# Patient Record
Sex: Male | Born: 1965 | Race: Black or African American | Hispanic: No | Marital: Married | Smoking: Never smoker
Health system: Southern US, Community
[De-identification: ages and names within clinical notes are randomized; demographics above are authoritative.]

## PROBLEM LIST (undated history)

## (undated) DIAGNOSIS — E119 Type 2 diabetes mellitus without complications: Secondary | ICD-10-CM

## (undated) DIAGNOSIS — G4733 Obstructive sleep apnea (adult) (pediatric): Secondary | ICD-10-CM

## (undated) DIAGNOSIS — Z9114 Patient's other noncompliance with medication regimen: Secondary | ICD-10-CM

## (undated) DIAGNOSIS — M545 Low back pain, unspecified: Secondary | ICD-10-CM

## (undated) DIAGNOSIS — Z91148 Patient's other noncompliance with medication regimen for other reason: Secondary | ICD-10-CM

## (undated) DIAGNOSIS — E669 Obesity, unspecified: Secondary | ICD-10-CM

## (undated) DIAGNOSIS — I10 Essential (primary) hypertension: Secondary | ICD-10-CM

## (undated) DIAGNOSIS — K219 Gastro-esophageal reflux disease without esophagitis: Secondary | ICD-10-CM

## (undated) DIAGNOSIS — N400 Enlarged prostate without lower urinary tract symptoms: Secondary | ICD-10-CM

## (undated) HISTORY — DX: Gastro-esophageal reflux disease without esophagitis: K21.9

## (undated) HISTORY — PX: SPINE SURGERY: SHX786

## (undated) HISTORY — DX: Low back pain: M54.5

## (undated) HISTORY — DX: Low back pain, unspecified: M54.50

## (undated) HISTORY — PX: SHOULDER ARTHROSCOPY: SHX128

## (undated) HISTORY — DX: Benign prostatic hyperplasia without lower urinary tract symptoms: N40.0

---

## 2002-07-06 ENCOUNTER — Emergency Department (HOSPITAL_COMMUNITY): Admission: EM | Admit: 2002-07-06 | Discharge: 2002-07-06 | Payer: Self-pay | Admitting: Emergency Medicine

## 2003-08-11 ENCOUNTER — Emergency Department (HOSPITAL_COMMUNITY): Admission: EM | Admit: 2003-08-11 | Discharge: 2003-08-11 | Payer: Self-pay | Admitting: *Deleted

## 2003-09-04 ENCOUNTER — Emergency Department (HOSPITAL_COMMUNITY): Admission: EM | Admit: 2003-09-04 | Discharge: 2003-09-04 | Payer: Self-pay | Admitting: Emergency Medicine

## 2003-12-01 ENCOUNTER — Emergency Department (HOSPITAL_COMMUNITY): Admission: EM | Admit: 2003-12-01 | Discharge: 2003-12-01 | Payer: Self-pay | Admitting: Emergency Medicine

## 2003-12-03 ENCOUNTER — Emergency Department (HOSPITAL_COMMUNITY): Admission: EM | Admit: 2003-12-03 | Discharge: 2003-12-03 | Payer: Self-pay | Admitting: Emergency Medicine

## 2003-12-20 ENCOUNTER — Emergency Department (HOSPITAL_COMMUNITY): Admission: EM | Admit: 2003-12-20 | Discharge: 2003-12-20 | Payer: Self-pay | Admitting: Emergency Medicine

## 2004-04-28 ENCOUNTER — Emergency Department (HOSPITAL_COMMUNITY): Admission: EM | Admit: 2004-04-28 | Discharge: 2004-04-28 | Payer: Self-pay | Admitting: Emergency Medicine

## 2004-06-18 ENCOUNTER — Emergency Department (HOSPITAL_COMMUNITY): Admission: EM | Admit: 2004-06-18 | Discharge: 2004-06-18 | Payer: Self-pay | Admitting: *Deleted

## 2004-07-16 ENCOUNTER — Emergency Department (HOSPITAL_COMMUNITY): Admission: EM | Admit: 2004-07-16 | Discharge: 2004-07-16 | Payer: Self-pay | Admitting: *Deleted

## 2004-09-17 ENCOUNTER — Emergency Department (HOSPITAL_COMMUNITY): Admission: EM | Admit: 2004-09-17 | Discharge: 2004-09-17 | Payer: Self-pay | Admitting: Emergency Medicine

## 2004-10-03 ENCOUNTER — Ambulatory Visit: Payer: Self-pay | Admitting: Orthopedic Surgery

## 2004-10-04 ENCOUNTER — Encounter: Payer: Self-pay | Admitting: Orthopedic Surgery

## 2004-10-16 ENCOUNTER — Ambulatory Visit: Payer: Self-pay | Admitting: Orthopedic Surgery

## 2004-10-28 ENCOUNTER — Encounter: Admission: RE | Admit: 2004-10-28 | Discharge: 2004-10-28 | Payer: Self-pay | Admitting: Orthopedic Surgery

## 2004-11-06 ENCOUNTER — Ambulatory Visit: Payer: Self-pay | Admitting: Orthopedic Surgery

## 2004-11-11 ENCOUNTER — Encounter: Admission: RE | Admit: 2004-11-11 | Discharge: 2004-11-11 | Payer: Self-pay | Admitting: Orthopedic Surgery

## 2004-11-20 ENCOUNTER — Ambulatory Visit: Payer: Self-pay | Admitting: Orthopedic Surgery

## 2004-11-26 ENCOUNTER — Encounter: Admission: RE | Admit: 2004-11-26 | Discharge: 2004-11-26 | Payer: Self-pay | Admitting: Orthopedic Surgery

## 2005-03-02 ENCOUNTER — Emergency Department (HOSPITAL_COMMUNITY): Admission: EM | Admit: 2005-03-02 | Discharge: 2005-03-02 | Payer: Self-pay | Admitting: Emergency Medicine

## 2005-04-07 ENCOUNTER — Ambulatory Visit: Payer: Self-pay | Admitting: Family Medicine

## 2005-04-09 ENCOUNTER — Ambulatory Visit (HOSPITAL_COMMUNITY): Admission: RE | Admit: 2005-04-09 | Discharge: 2005-04-09 | Payer: Self-pay | Admitting: Family Medicine

## 2005-04-11 ENCOUNTER — Ambulatory Visit (HOSPITAL_COMMUNITY): Admission: RE | Admit: 2005-04-11 | Discharge: 2005-04-11 | Payer: Self-pay | Admitting: Family Medicine

## 2005-04-15 ENCOUNTER — Emergency Department (HOSPITAL_COMMUNITY): Admission: EM | Admit: 2005-04-15 | Discharge: 2005-04-15 | Payer: Self-pay | Admitting: Emergency Medicine

## 2005-05-05 ENCOUNTER — Ambulatory Visit: Payer: Self-pay | Admitting: Family Medicine

## 2005-05-05 ENCOUNTER — Emergency Department (HOSPITAL_COMMUNITY): Admission: EM | Admit: 2005-05-05 | Discharge: 2005-05-06 | Payer: Self-pay | Admitting: Emergency Medicine

## 2005-05-07 ENCOUNTER — Ambulatory Visit (HOSPITAL_COMMUNITY): Admission: RE | Admit: 2005-05-07 | Discharge: 2005-05-07 | Payer: Self-pay | Admitting: Neurology

## 2005-05-08 ENCOUNTER — Ambulatory Visit: Payer: Self-pay | Admitting: Family Medicine

## 2005-05-12 ENCOUNTER — Encounter (INDEPENDENT_AMBULATORY_CARE_PROVIDER_SITE_OTHER): Payer: Self-pay | Admitting: Specialist

## 2005-05-12 ENCOUNTER — Inpatient Hospital Stay (HOSPITAL_COMMUNITY): Admission: RE | Admit: 2005-05-12 | Discharge: 2005-05-15 | Payer: Self-pay | Admitting: Neurological Surgery

## 2005-06-02 ENCOUNTER — Ambulatory Visit: Payer: Self-pay | Admitting: Family Medicine

## 2005-07-23 ENCOUNTER — Emergency Department (HOSPITAL_COMMUNITY): Admission: EM | Admit: 2005-07-23 | Discharge: 2005-07-24 | Payer: Self-pay | Admitting: Emergency Medicine

## 2005-09-02 ENCOUNTER — Ambulatory Visit (HOSPITAL_COMMUNITY): Admission: RE | Admit: 2005-09-02 | Discharge: 2005-09-02 | Payer: Self-pay | Admitting: Neurological Surgery

## 2006-01-21 ENCOUNTER — Ambulatory Visit: Payer: Self-pay | Admitting: Orthopedic Surgery

## 2006-02-12 ENCOUNTER — Ambulatory Visit: Payer: Self-pay | Admitting: Orthopedic Surgery

## 2006-06-15 ENCOUNTER — Ambulatory Visit: Payer: Self-pay | Admitting: Orthopedic Surgery

## 2006-11-01 ENCOUNTER — Emergency Department (HOSPITAL_COMMUNITY): Admission: EM | Admit: 2006-11-01 | Discharge: 2006-11-02 | Payer: Self-pay | Admitting: Emergency Medicine

## 2006-12-03 ENCOUNTER — Ambulatory Visit: Payer: Self-pay | Admitting: Orthopedic Surgery

## 2006-12-04 ENCOUNTER — Ambulatory Visit: Payer: Self-pay | Admitting: Family Medicine

## 2006-12-07 ENCOUNTER — Encounter: Payer: Self-pay | Admitting: Orthopedic Surgery

## 2006-12-08 ENCOUNTER — Encounter: Payer: Self-pay | Admitting: Family Medicine

## 2006-12-08 DIAGNOSIS — M545 Low back pain, unspecified: Secondary | ICD-10-CM | POA: Insufficient documentation

## 2006-12-08 DIAGNOSIS — N400 Enlarged prostate without lower urinary tract symptoms: Secondary | ICD-10-CM | POA: Insufficient documentation

## 2006-12-08 DIAGNOSIS — E669 Obesity, unspecified: Secondary | ICD-10-CM

## 2006-12-08 DIAGNOSIS — G43909 Migraine, unspecified, not intractable, without status migrainosus: Secondary | ICD-10-CM | POA: Insufficient documentation

## 2006-12-08 DIAGNOSIS — K219 Gastro-esophageal reflux disease without esophagitis: Secondary | ICD-10-CM

## 2006-12-08 DIAGNOSIS — E785 Hyperlipidemia, unspecified: Secondary | ICD-10-CM

## 2006-12-08 DIAGNOSIS — H52209 Unspecified astigmatism, unspecified eye: Secondary | ICD-10-CM | POA: Insufficient documentation

## 2006-12-08 DIAGNOSIS — I10 Essential (primary) hypertension: Secondary | ICD-10-CM

## 2006-12-08 LAB — CONVERTED CEMR LAB
ALT: 26 units/L (ref 0–53)
Calcium: 9.1 mg/dL (ref 8.4–10.5)
Cholesterol: 172 mg/dL (ref 0–200)
Creatinine, Ser: 1.17 mg/dL (ref 0.40–1.50)
Glucose, Bld: 107 mg/dL — ABNORMAL HIGH (ref 70–99)
Hemoglobin: 16.1 g/dL (ref 13.0–17.0)
Lymphocytes Relative: 40 % (ref 12–46)
MCHC: 32.7 g/dL (ref 30.0–36.0)
MCV: 95.4 fL (ref 78.0–100.0)
Monocytes Absolute: 0.5 10*3/uL (ref 0.2–0.7)
PSA: 0.76 ng/mL (ref 0.10–4.00)
Sodium: 145 meq/L (ref 135–145)
Total Bilirubin: 0.5 mg/dL (ref 0.3–1.2)
Total CHOL/HDL Ratio: 3.7
VLDL: 11 mg/dL (ref 0–40)

## 2006-12-14 ENCOUNTER — Encounter (INDEPENDENT_AMBULATORY_CARE_PROVIDER_SITE_OTHER): Payer: Self-pay | Admitting: Family Medicine

## 2006-12-16 ENCOUNTER — Emergency Department (HOSPITAL_COMMUNITY): Admission: EM | Admit: 2006-12-16 | Discharge: 2006-12-16 | Payer: Self-pay | Admitting: Emergency Medicine

## 2006-12-18 ENCOUNTER — Ambulatory Visit: Payer: Self-pay | Admitting: Family Medicine

## 2006-12-21 ENCOUNTER — Ambulatory Visit: Payer: Self-pay | Admitting: Orthopedic Surgery

## 2006-12-24 ENCOUNTER — Encounter (HOSPITAL_COMMUNITY): Admission: RE | Admit: 2006-12-24 | Discharge: 2007-01-23 | Payer: Self-pay | Admitting: Orthopedic Surgery

## 2006-12-26 ENCOUNTER — Ambulatory Visit (HOSPITAL_COMMUNITY): Admission: RE | Admit: 2006-12-26 | Discharge: 2006-12-26 | Payer: Self-pay | Admitting: Family Medicine

## 2006-12-26 ENCOUNTER — Encounter (INDEPENDENT_AMBULATORY_CARE_PROVIDER_SITE_OTHER): Payer: Self-pay | Admitting: Family Medicine

## 2006-12-26 ENCOUNTER — Ambulatory Visit: Admission: RE | Admit: 2006-12-26 | Discharge: 2006-12-26 | Payer: Self-pay | Admitting: Family Medicine

## 2006-12-28 ENCOUNTER — Encounter (INDEPENDENT_AMBULATORY_CARE_PROVIDER_SITE_OTHER): Payer: Self-pay | Admitting: Family Medicine

## 2007-01-01 ENCOUNTER — Encounter (INDEPENDENT_AMBULATORY_CARE_PROVIDER_SITE_OTHER): Payer: Self-pay | Admitting: Family Medicine

## 2007-01-12 ENCOUNTER — Ambulatory Visit: Payer: Self-pay | Admitting: Family Medicine

## 2007-01-12 DIAGNOSIS — R0789 Other chest pain: Secondary | ICD-10-CM

## 2007-01-12 DIAGNOSIS — R1012 Left upper quadrant pain: Secondary | ICD-10-CM

## 2007-01-12 DIAGNOSIS — R7989 Other specified abnormal findings of blood chemistry: Secondary | ICD-10-CM | POA: Insufficient documentation

## 2007-01-12 DIAGNOSIS — R519 Headache, unspecified: Secondary | ICD-10-CM | POA: Insufficient documentation

## 2007-01-12 DIAGNOSIS — R51 Headache: Secondary | ICD-10-CM

## 2007-01-12 DIAGNOSIS — G4730 Sleep apnea, unspecified: Secondary | ICD-10-CM | POA: Insufficient documentation

## 2007-01-12 LAB — CONVERTED CEMR LAB: Cholesterol, target level: 200 mg/dL

## 2007-01-13 ENCOUNTER — Ambulatory Visit: Payer: Self-pay | Admitting: Pulmonary Disease

## 2007-01-15 ENCOUNTER — Telehealth (INDEPENDENT_AMBULATORY_CARE_PROVIDER_SITE_OTHER): Payer: Self-pay | Admitting: Family Medicine

## 2007-01-15 ENCOUNTER — Encounter (INDEPENDENT_AMBULATORY_CARE_PROVIDER_SITE_OTHER): Payer: Self-pay | Admitting: Family Medicine

## 2007-01-26 ENCOUNTER — Encounter (INDEPENDENT_AMBULATORY_CARE_PROVIDER_SITE_OTHER): Payer: Self-pay | Admitting: Family Medicine

## 2007-01-27 ENCOUNTER — Encounter (HOSPITAL_COMMUNITY): Admission: RE | Admit: 2007-01-27 | Discharge: 2007-02-26 | Payer: Self-pay | Admitting: Orthopedic Surgery

## 2007-01-29 ENCOUNTER — Ambulatory Visit (HOSPITAL_COMMUNITY): Admission: RE | Admit: 2007-01-29 | Discharge: 2007-01-29 | Payer: Self-pay | Admitting: *Deleted

## 2007-01-30 ENCOUNTER — Encounter (INDEPENDENT_AMBULATORY_CARE_PROVIDER_SITE_OTHER): Payer: Self-pay | Admitting: Family Medicine

## 2007-02-04 ENCOUNTER — Encounter (INDEPENDENT_AMBULATORY_CARE_PROVIDER_SITE_OTHER): Payer: Self-pay | Admitting: Family Medicine

## 2007-02-09 ENCOUNTER — Ambulatory Visit: Payer: Self-pay | Admitting: Family Medicine

## 2007-02-09 DIAGNOSIS — E669 Obesity, unspecified: Secondary | ICD-10-CM

## 2007-02-09 DIAGNOSIS — E1169 Type 2 diabetes mellitus with other specified complication: Secondary | ICD-10-CM | POA: Insufficient documentation

## 2007-02-22 ENCOUNTER — Ambulatory Visit: Payer: Self-pay | Admitting: Orthopedic Surgery

## 2007-02-24 ENCOUNTER — Telehealth (INDEPENDENT_AMBULATORY_CARE_PROVIDER_SITE_OTHER): Payer: Self-pay | Admitting: Family Medicine

## 2007-03-19 ENCOUNTER — Encounter (INDEPENDENT_AMBULATORY_CARE_PROVIDER_SITE_OTHER): Payer: Self-pay | Admitting: Family Medicine

## 2007-04-05 ENCOUNTER — Ambulatory Visit: Payer: Self-pay | Admitting: Orthopedic Surgery

## 2007-04-05 ENCOUNTER — Encounter (INDEPENDENT_AMBULATORY_CARE_PROVIDER_SITE_OTHER): Payer: Self-pay | Admitting: Family Medicine

## 2007-04-25 ENCOUNTER — Emergency Department (HOSPITAL_COMMUNITY): Admission: EM | Admit: 2007-04-25 | Discharge: 2007-04-25 | Payer: Self-pay | Admitting: Emergency Medicine

## 2007-05-24 ENCOUNTER — Encounter (INDEPENDENT_AMBULATORY_CARE_PROVIDER_SITE_OTHER): Payer: Self-pay | Admitting: Family Medicine

## 2007-06-29 ENCOUNTER — Ambulatory Visit: Payer: Self-pay | Admitting: Orthopedic Surgery

## 2007-10-05 ENCOUNTER — Encounter (INDEPENDENT_AMBULATORY_CARE_PROVIDER_SITE_OTHER): Payer: Self-pay | Admitting: Family Medicine

## 2007-10-18 ENCOUNTER — Encounter (INDEPENDENT_AMBULATORY_CARE_PROVIDER_SITE_OTHER): Payer: Self-pay | Admitting: Family Medicine

## 2007-10-20 ENCOUNTER — Encounter (INDEPENDENT_AMBULATORY_CARE_PROVIDER_SITE_OTHER): Payer: Self-pay | Admitting: Family Medicine

## 2007-10-28 ENCOUNTER — Encounter (INDEPENDENT_AMBULATORY_CARE_PROVIDER_SITE_OTHER): Payer: Self-pay | Admitting: Family Medicine

## 2007-11-25 ENCOUNTER — Encounter: Payer: Self-pay | Admitting: Family Medicine

## 2007-12-08 ENCOUNTER — Ambulatory Visit: Payer: Self-pay | Admitting: Orthopedic Surgery

## 2007-12-08 DIAGNOSIS — M25519 Pain in unspecified shoulder: Secondary | ICD-10-CM

## 2008-01-14 ENCOUNTER — Encounter (INDEPENDENT_AMBULATORY_CARE_PROVIDER_SITE_OTHER): Payer: Self-pay | Admitting: Family Medicine

## 2008-02-01 ENCOUNTER — Emergency Department (HOSPITAL_COMMUNITY): Admission: EM | Admit: 2008-02-01 | Discharge: 2008-02-01 | Payer: Self-pay | Admitting: Emergency Medicine

## 2008-03-01 ENCOUNTER — Encounter (INDEPENDENT_AMBULATORY_CARE_PROVIDER_SITE_OTHER): Payer: Self-pay | Admitting: Family Medicine

## 2008-03-12 ENCOUNTER — Emergency Department (HOSPITAL_COMMUNITY): Admission: EM | Admit: 2008-03-12 | Discharge: 2008-03-12 | Payer: Self-pay | Admitting: Emergency Medicine

## 2008-03-16 ENCOUNTER — Telehealth: Payer: Self-pay | Admitting: Orthopedic Surgery

## 2008-03-23 ENCOUNTER — Ambulatory Visit: Payer: Self-pay | Admitting: Orthopedic Surgery

## 2008-05-08 ENCOUNTER — Ambulatory Visit: Payer: Self-pay | Admitting: Orthopedic Surgery

## 2008-07-15 ENCOUNTER — Emergency Department (HOSPITAL_COMMUNITY): Admission: EM | Admit: 2008-07-15 | Discharge: 2008-07-15 | Payer: Self-pay | Admitting: Emergency Medicine

## 2008-07-27 ENCOUNTER — Ambulatory Visit: Payer: Self-pay | Admitting: Orthopedic Surgery

## 2008-11-13 ENCOUNTER — Ambulatory Visit: Payer: Self-pay | Admitting: Orthopedic Surgery

## 2008-11-13 DIAGNOSIS — M7580 Other shoulder lesions, unspecified shoulder: Secondary | ICD-10-CM

## 2009-03-11 ENCOUNTER — Emergency Department (HOSPITAL_COMMUNITY): Admission: EM | Admit: 2009-03-11 | Discharge: 2009-03-11 | Payer: Self-pay | Admitting: Emergency Medicine

## 2009-04-05 ENCOUNTER — Emergency Department (HOSPITAL_COMMUNITY): Admission: EM | Admit: 2009-04-05 | Discharge: 2009-04-05 | Payer: Self-pay | Admitting: Emergency Medicine

## 2009-04-06 ENCOUNTER — Ambulatory Visit (HOSPITAL_COMMUNITY): Admission: RE | Admit: 2009-04-06 | Discharge: 2009-04-06 | Payer: Self-pay | Admitting: Family Medicine

## 2009-04-10 ENCOUNTER — Encounter (INDEPENDENT_AMBULATORY_CARE_PROVIDER_SITE_OTHER): Payer: Self-pay | Admitting: *Deleted

## 2009-04-24 ENCOUNTER — Ambulatory Visit: Payer: Self-pay | Admitting: Orthopedic Surgery

## 2009-06-12 ENCOUNTER — Encounter (INDEPENDENT_AMBULATORY_CARE_PROVIDER_SITE_OTHER): Payer: Self-pay | Admitting: *Deleted

## 2009-08-02 ENCOUNTER — Telehealth: Payer: Self-pay | Admitting: Orthopedic Surgery

## 2010-01-06 ENCOUNTER — Emergency Department (HOSPITAL_COMMUNITY): Admission: EM | Admit: 2010-01-06 | Discharge: 2010-01-06 | Payer: Self-pay | Admitting: Emergency Medicine

## 2010-02-14 ENCOUNTER — Ambulatory Visit: Payer: Self-pay | Admitting: Orthopedic Surgery

## 2010-02-14 DIAGNOSIS — M75120 Complete rotator cuff tear or rupture of unspecified shoulder, not specified as traumatic: Secondary | ICD-10-CM | POA: Insufficient documentation

## 2010-04-16 ENCOUNTER — Ambulatory Visit: Payer: Self-pay | Admitting: Orthopedic Surgery

## 2010-04-16 ENCOUNTER — Encounter (INDEPENDENT_AMBULATORY_CARE_PROVIDER_SITE_OTHER): Payer: Self-pay | Admitting: *Deleted

## 2010-04-16 DIAGNOSIS — M19019 Primary osteoarthritis, unspecified shoulder: Secondary | ICD-10-CM | POA: Insufficient documentation

## 2010-04-29 ENCOUNTER — Encounter: Payer: Self-pay | Admitting: Orthopedic Surgery

## 2010-04-30 ENCOUNTER — Ambulatory Visit (HOSPITAL_COMMUNITY): Admission: RE | Admit: 2010-04-30 | Discharge: 2010-04-30 | Payer: Self-pay | Admitting: Orthopedic Surgery

## 2010-04-30 ENCOUNTER — Telehealth: Payer: Self-pay | Admitting: Orthopedic Surgery

## 2010-05-06 ENCOUNTER — Encounter: Payer: Self-pay | Admitting: Orthopedic Surgery

## 2010-05-09 ENCOUNTER — Encounter: Payer: Self-pay | Admitting: Orthopedic Surgery

## 2010-05-17 ENCOUNTER — Telehealth (INDEPENDENT_AMBULATORY_CARE_PROVIDER_SITE_OTHER): Payer: Self-pay | Admitting: *Deleted

## 2010-05-20 ENCOUNTER — Encounter: Payer: Self-pay | Admitting: Orthopedic Surgery

## 2010-05-30 ENCOUNTER — Encounter: Payer: Self-pay | Admitting: Orthopedic Surgery

## 2010-05-31 ENCOUNTER — Ambulatory Visit: Payer: Self-pay | Admitting: Orthopedic Surgery

## 2010-05-31 ENCOUNTER — Ambulatory Visit (HOSPITAL_COMMUNITY): Admission: RE | Admit: 2010-05-31 | Discharge: 2010-05-31 | Payer: Self-pay | Admitting: Orthopedic Surgery

## 2010-06-04 ENCOUNTER — Ambulatory Visit: Payer: Self-pay | Admitting: Orthopedic Surgery

## 2010-06-05 ENCOUNTER — Encounter (HOSPITAL_COMMUNITY): Admission: RE | Admit: 2010-06-05 | Discharge: 2010-07-05 | Payer: Self-pay | Admitting: Orthopedic Surgery

## 2010-06-12 ENCOUNTER — Encounter: Payer: Self-pay | Admitting: Orthopedic Surgery

## 2010-06-14 ENCOUNTER — Telehealth: Payer: Self-pay | Admitting: Orthopedic Surgery

## 2010-06-25 ENCOUNTER — Encounter: Payer: Self-pay | Admitting: Orthopedic Surgery

## 2010-06-27 ENCOUNTER — Ambulatory Visit: Payer: Self-pay | Admitting: Orthopedic Surgery

## 2010-07-09 ENCOUNTER — Encounter (HOSPITAL_COMMUNITY): Admission: RE | Admit: 2010-07-09 | Discharge: 2010-08-08 | Payer: Self-pay | Admitting: Orthopedic Surgery

## 2010-07-11 ENCOUNTER — Encounter: Payer: Self-pay | Admitting: Orthopedic Surgery

## 2010-07-31 ENCOUNTER — Ambulatory Visit: Payer: Self-pay | Admitting: Orthopedic Surgery

## 2010-08-01 ENCOUNTER — Encounter: Payer: Self-pay | Admitting: Orthopedic Surgery

## 2010-08-29 ENCOUNTER — Ambulatory Visit: Payer: Self-pay | Admitting: Orthopedic Surgery

## 2010-08-29 DIAGNOSIS — M5030 Other cervical disc degeneration, unspecified cervical region: Secondary | ICD-10-CM

## 2010-09-03 ENCOUNTER — Encounter (INDEPENDENT_AMBULATORY_CARE_PROVIDER_SITE_OTHER): Payer: Self-pay | Admitting: *Deleted

## 2010-09-26 ENCOUNTER — Encounter: Payer: Self-pay | Admitting: Orthopedic Surgery

## 2010-09-26 ENCOUNTER — Telehealth: Payer: Self-pay | Admitting: Orthopedic Surgery

## 2010-09-30 ENCOUNTER — Telehealth: Payer: Self-pay | Admitting: Orthopedic Surgery

## 2010-09-30 ENCOUNTER — Encounter (INDEPENDENT_AMBULATORY_CARE_PROVIDER_SITE_OTHER): Payer: Self-pay | Admitting: *Deleted

## 2010-10-01 ENCOUNTER — Ambulatory Visit (HOSPITAL_COMMUNITY): Admission: RE | Admit: 2010-10-01 | Discharge: 2010-10-01 | Payer: Self-pay | Admitting: Orthopedic Surgery

## 2010-10-08 ENCOUNTER — Ambulatory Visit: Payer: Self-pay | Admitting: Orthopedic Surgery

## 2010-12-15 ENCOUNTER — Encounter: Payer: Self-pay | Admitting: Orthopedic Surgery

## 2010-12-24 NOTE — Letter (Signed)
Summary: surgery order RT shoulder sched 05/31/10  surgery order RT shoulder sched 05/31/10   Imported By: Cammie Sickle 05/21/2010 12:20:06  _____________________________________________________________________  External Attachment:    Type:   Image     Comment:   External Document

## 2010-12-24 NOTE — Miscellaneous (Signed)
Summary: insurance expired 07/24/10 per ins[employer] co [employer]  Clinical Lists Changes   advised pt we will not be able to schedule MRI not unless he wants to pay out of pocket, he said he would call us back today.

## 2010-12-24 NOTE — Letter (Signed)
Summary: surgery order RTshoulder sched date 161096  surgery order RTshoulder sched date [id number]   Imported By: Cammie Sickle 05/20/2010 10:42:08  _____________________________________________________________________  External Attachment:    Type:   Image     Comment:   External Document

## 2010-12-24 NOTE — Progress Notes (Signed)
Summary: call from patient followup about schedule of surgery  Phone Note Call from Patient   Caller: Patient Summary of Call: Patient called back to follow up on whether surgery is able to be re-scheduled.  He states that he was back to see Dr [name] last Friday, 05/10/10, and that he was told he was cleared for surgery. Last office note from Dr. Otilio Saber office,  Ph [phone]) we had rec'd was visit there on 05/09/10 and medical clearance noted. I called Dr Otilio Saber office to re-check if any further visits or notes since that date; left message. Please advise patient. PATIENT PH:  CELL # [id number], Home # [id number]. Initial call taken by: Cammie Sickle,  May 17, 2010 9:13 AM  Follow-up for Phone Call        we need med clearance from PCP thanks and then I will schedule Follow-up by: Ether Griffins,  May 20, 2010 8:39 AM  Additional Follow-up for Phone Call Additional follow up Details #1::        the fax received 05/09/10 in patient's chart includes lab and medical clearance.   I have not heard back from PCP, Dr Otilio Saber office of any further office notes. Additional Follow-up by: Cammie Sickle,  May 20, 2010 9:14 AM    Additional Follow-up for Phone Call Additional follow up Details #2::    ok scheduled for 05/31/10 Follow-up by: Ether Griffins,  May 20, 2010 9:21 AM

## 2010-12-24 NOTE — Assessment & Plan Note (Signed)
Summary: PRE-OP SURGERY RT SHOULDER/?INS/CAF   Visit Type:  Follow-up Primary Provider:  [name], MD  CC:  right shoulder recheck.  History of Present Illness: I saw Geoffrey Jackson in the office today for a followup visit.  He is a 45 years old man with the complaint of:  Right shoulder pain.  MR at [facility] 12[facility]07/2006: Supraspinatus tendonosis with PASTA , Mod AC joint OA   Has had multiple injections, home exercises, PT, pain med for this shoulder.  Last injection was in June 2010 helped for a while.  Today wants to discuss having surgery right shoulder.  Vicodin 5 for pain He says it doesn't help.  The MRI in January of 2008 showed supraspinatus tendinosis intrasubstance partial thickness undersurface tear, a.c. joint arthrosis with narrowing of the subacromial outlet. Some fluid in the biceps tendon. and tenosynovitis.  we injected the subacromial space to give him time until May 31 when he wants to have the surgery. All see him on the 24th for the preop visit.  We plan to do an arthroscopy of the shoulder and decompression and then evaluate the distal clavicle for possible resection and an open repair of the  rotator cuff as needed  He does not have any insurance as of now.  Wants to have surgery June 7th if possible.      Allergies: 1)  ! Prevacid  Review of Systems Musculoskeletal:  See HPI.  The review of systems is negative for Constitutional, Cardiovascular, Respiratory, Gastrointestinal, Genitourinary, Neurologic, Endocrine, Psychiatric, Skin, HEENT, Immunology, and Hemoatologic.  Physical Exam  Additional Exam:  Large muscular an overweight male awake alert oriented x3 mood and affect normal normal sensation RIGHT arm pulse perfusion normal RIGHT arm skin intact.  Inspection RIGHT shoulder reveals full forward elevation and painful from the arc of 120 -180.  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 Patient Level IV ([address])  Problem # 2:  IMPINGEMENT SYNDROME (ICD-726.2)  Orders: Est. 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 Patient Level IV (09811) do h/p later   Patient Instructions: 1)  sars, asad, ad-clav, open rcr  2)  DOS 04/30/10 3)  I will call you tomorrow and let you know when to go for preop at [facility] 4)  Take packet with you for preop 5)  Post op 1 in our office on 05/02/10

## 2010-12-24 NOTE — Letter (Signed)
Summary: Generic Letter  [facility]  [address]  [place], [place] [address]   Phone: [phone]  Fax: [phone]    [facility]  [address]  [place], [place] [address]   Phone: [phone]  Fax: [phone]    04/29/2010  Geoffrey Jackson [address] [place], [place]  [address]  [birth year] MR# [id number]  chief complaint RIGHT shoulder pain  History of present illness.  The patient injured himself Easter  March of 2007.  He was rebounding a basketball and his arm went behind his head, someone reached for the ball hit his arm and his shoulder popped.  He did go the emergency room x-rays were normal.  He was treated for shoulder sprain and eventually had an MRI @ [facility] in Jan 2008.  He was treated with physical therapy, multiple subacromial injections, oral pain medicines including Lorcet +, Lorcet 10 mg and has failed to improve.  He was offered surgical treatment and put it off for several months trying to get his schedule conducive to surgery and is finally able to do that.  Informed consent was done in the office and the patient understands the risks of surgery which include continued pain, stiffness, weakness.  His option for nonsurgical treatment was not satisfactory to him which included continued exercises, pain medication and physical therapy.  Review of systems he has a history of a lumbar surgery has some mild residual symptoms from that other than musculoskeletal system and some hypertension his systems were normal  He is ALLERGIC to no medications.  He's had foot and ankle surgery on the LEFT side these had back surgery twice.  His family history is negative.  Social history he is married.  Highest grade level completed was 12.  He denies smoking or drinking.  PHSYICAL EXAM:  His weight is close to 300 pounds respiratory rate 18, pulse 78.  Body habitus shows increased BMI.  Cardiovascular:  Peripheral vascular system observation was normal palpation was normal  Lymph nodes cervical spine normal  Skin normal  Neurologic exam normal sensation in his upper extremities coordination normal.  Reflex is normal.  He is alert he is awake he is oriented x3 his mood is pleasant.  Musculoskeletal: Ambulation is normal.  RIGHT shoulder: His passive range of motion is normal he is tender over his a.c. joint posterior acromion and anterolateral deltoid.  He has some pain with apprehension.  No instability is noted.  He has weakness in his rotator cuff supraspinatus.  His other extremities are within normal limits  Impression rotator cuff tendinosis, partial rotator cuff tear, a.c. joint arthrosis.  Plan arthroscopy RIGHT shoulder, removal of distal clavicle, possible open rotator cuff repair.   Geoffrey Jackson., MD    Signed by Geoffrey Canada MD on 04/29/2010 at 12:29 PM

## 2010-12-24 NOTE — Assessment & Plan Note (Signed)
Summary: 1 M RE-CK/POST OP RT SHOULDER,SURG 05/31/10/[employer] INS,RETRO MEDI...   Visit Type:  post op Primary Jolea Dolle:  [name], MD  CC:  right shoulder .  History of Present Illness: I saw Everardo Jankovich in the office today for a 1 month followup visit.  He is a 45 years old man with the complaint of:  right shoulder  SARS, debridement biceps tendon, debridement of RC, acromioplasty and bursectomy.  DOS 05/31/10.  Meds: Robaxin, Norco 10 and Ibuprofen, takes 2 of everything every 4 hrs.  complains of a grinding, popping sensation. The RIGHT shoulder range of motion, still has the same complaining of pain since the problem occurred in therapy. Patient has missed the last couple visits of therapy. Mainly complains of pain in abduction external rotation with associated numbness running up his cervical spine.  His active forward elevation of 220 and in between 120 -150 he has pain.  Needs refill on Hydrocodone.    Allergies: 1)  ! Prevacid  Physical Exam  Additional Exam:  no swelling is seen. No tenderness. He does have a grinding on internal/external rotation with his arm at his side. He has a mild impingement sign.  Otherwise cuff strength is intact and normal.  An abduction external rotation. He complains of anterior shoulder pain.   Impression & Recommendations:  Problem # 1:  AFTERCARE FOLLOW SURGERY MUSCULOSKEL SYSTEM NEC (ICD-V58.78) Assessment Unchanged  Orders: Post-Op Check ([address])  Problem # 2:  IMPINGEMENT SYNDROME (ICD-726.2) Assessment: Unchanged because his range of motion is very good. His strength is good. I advised him to do home exercise program. I don't think he needs to go to therapy anymore. He doesn't work on scapular stabilizers. I'll see him in a month if he continues to complain of this, numbness. We'll x-ray his cervical spine  Patient Instructions: 1)  Please schedule a follow-up appointment in 1 month. Prescriptions: NORCO 10-325  MG TABS (HYDROCODONE-ACETAMINOPHEN) 1 by mouth q 4 hrs as needed pain  #60 x 4   Entered and Authorized by:   Fuller Canada MD   Signed by:   Fuller Canada MD on 07/31/2010   Method used:   Print then Give to Patient   RxID:   [id number]

## 2010-12-24 NOTE — Miscellaneous (Signed)
Visit Type:  Follow-up Primary Provider:  DR DON DIEGO   CC:  right shoulder pain  History of Present Illness: I saw Geoffrey Jackson in the office today for a followup visit.  He is a 45 years old man with the complaint of:  Right shoulder pain.  [facility] at [facility]07/2006: Supraspinatus tendonosis with PASTA , Mod AC joint OA   Has had multiple injections, home exercises, PT, pain med for this shoulder.  Last injection was in June 2010 helped for a while.  Vicodin 5 for pain He says it doesn't help.  The MRI in January of 2008 showed supraspinatus tendinosis intrasubstance partial thickness undersurface tear, a.c. joint arthrosis with narrowing of the subacromial outlet. Some fluid in the biceps tendon. and tenosynovitis.  We plan to do an arthroscopy of the shoulder and decompression and then evaluate the distal clavicle for possible resection and an open repair of the  rotator cuff as needed  He does not have any insurance as of now.  He has been cleared for his procedure   Past Medical History:    Reviewed history from 01/12/2007 and no changes required:       GERD       Hyperlipidemia       Hypertension       Low back pain       Benign prostatic hypertrophy       Abnormal Cardiolyte 01/01/07 - ant and inf ischemia with EF 48%  Past Surgical History:    Reviewed history from 12/08/2006 and no changes required:       LEFT ANKLE RECONSTRUCTION 1983       GANGLEON CYST RIGHT WRIST 1996       VERTEBRAE REMOVAL 2006   Allergies: 1)  ! Prevacid  Review of Systems Musculoskeletal:  See HPI.  The review of systems is negative for Constitutional, Cardiovascular, Respiratory, Gastrointestinal, Genitourinary, Neurologic, Endocrine, Psychiatric, Skin, HEENT, Immunology, and Hemoatologic.  Physical Exam  Constitutional: vital signs see recorded values. General: normal development, nutrition, and grooming. No deformity. Body Habitus is large CDV: Observation and palpation was  normal  Lymph: palpation of the lymph nodes were normal Skin: inspection and palpation of the skin revealed no abnormalities  Neuro: coordination: normal              DTR's normal              Sensation was normal  Psyche: Alert and oriented x 3. Mood was normal.  Affect: normal  MSK: Gait: normal   RIGHT arm pulse perfusion normal RIGHT arm skin intact.  Inspection RIGHT shoulder reveals full forward elevation and painful from the arc of 120 -180.  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 Patient Level IV ([address])  Problem # 2:  IMPINGEMENT SYNDROME (ICD-726.2)  Orders: Est. 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 I have answered their questions. The risks of bleeding, infection, nerve and vascualr injury have been discussed. The diagnosis and reason for surgery have been  explained. The patient demonstrates understanding of this discussion. Specific to this procedure risks include: stiffness, pain and weakness.      Patient Instructions: POST OP JULY 12 445PM

## 2010-12-24 NOTE — Assessment & Plan Note (Signed)
Summary: POST OP 1/RT SHOULDER SURGERY 05/31/10/SELF PAY VS MEDICAID/CAF   Visit Type:  Follow-up Primary Provider:  [name], MD  CC:  post op 1 shoulder.  History of Present Illness: I saw Geoffrey Jackson in the office today for a followup visit.  He is a 45 years old man with the complaint of:  post op 1 SARS, debridement biceps tendon, debridement of RC, acromioplasty and bursectomy.  DOS 05/31/10.  POD 4.  Today for recheck.  Meds: Robaxin, Percocet and Ibuprofen, takes 2 of everything every 4 hrs.  Starts therapy 06/05/10 [facility].  Doing ok.  Allergies: 1)  ! Prevacid  Physical Exam  Extremities:  mild swelling   hand function is normal  Skin:  incisions look good    Other Orders: Post-Op Check ([address])  Patient Instructions: 1)  sling x 3 weeks  2)  return in 3 weeks  3)  start PT

## 2010-12-24 NOTE — Letter (Signed)
Summary: Out of Work note Updated   [facility]  [address]  [place], [place] [address]   Phone: [phone]  Fax: [phone]    September 30, 2010   Employee:  Geoffrey Jackson    To Whom It May Concern:   For Medical reasons, please continue out of work status regarding the above named employee from work through  October 11, 2010, or until further notice.    If you need additional information, please feel free to contact our office.         Sincerely,    Terrance Mass, MD

## 2010-12-24 NOTE — Assessment & Plan Note (Signed)
Summary: POST OP 2/RT SHOULDER/SURG 05/31/10/SELF PAY?MEDICAID RETRO/CAF   Visit Type:  Follow-up Primary Geoffrey Jackson:  [name], MD  CC:  post op 2 rt shoulder.  History of Present Illness: I saw Geoffrey Jackson in the office today for a followup visit.  He is a 45 years old man with the complaint of:  post op viist #  2   SARS, debridement biceps tendon, debridement of RC, acromioplasty and bursectomy.  DOS 05/31/10.  POD 27  Meds: Robaxin, Norco 10 and Ibuprofen, takes 2 of everything every 4 hrs.  TODAY   RECHECK POST PT  he says his therapist is rough on him and he and an episode of numbness and popping when she moved his arm.  Again he is not wearing his sling and is being noncompliant  he says he has good movement.    Allergies: 1)  ! Prevacid  Physical Exam  Additional Exam:  the shoulder looks good PROM = flex/150   ER = 50  ABD = 90   he had some pain with ABD ER    Impression & Recommendations:  Problem # 1:  AFTERCARE FOLLOW SURGERY MUSCULOSKEL SYSTEM NEC (ICD-V58.78) Assessment Comment Only  Orders: Post-Op Check (96295)  Problem # 2:  IMPINGEMENT SYNDROME (ICD-726.2) Assessment: Comment Only  Orders: Post-Op Check (28413)  Patient Instructions: 1)  Return in one month 2)  Continue physical therapy

## 2010-12-24 NOTE — Miscellaneous (Signed)
Summary: OT clinical evaluation  OT clinical evaluation   Imported By: Jacklynn Ganong 07/16/2010 12:19:06  _____________________________________________________________________  External Attachment:    Type:   Image     Comment:   External Document

## 2010-12-24 NOTE — Letter (Signed)
Summary: [employer] form   Imported By: Cammie Sickle 06/18/2010 14:50:56  _____________________________________________________________________  External Attachment:    Type:   Image     Comment:   External Document

## 2010-12-24 NOTE — Letter (Signed)
Summary: Work [facility]  [address]  [place], [place] [address]   Phone: [phone]  Fax: [phone]    Today's Date: Apr 16, 2010  Name of Patient: Geoffrey Jackson  The above named patient had a medical visit today, 04/16/10.  Please take this into consideration when reviewing the time away from work/school.    Special Instructions:    [X] Other ____  - To be out of work from April 30, 2010 through May 03, 2010 due to medical reasons. ________________________________________________________________________   Sincerely,   [name], Montez Hageman, MD

## 2010-12-24 NOTE — Assessment & Plan Note (Signed)
Summary: WANTS TO EVALUATE RT SHOULDER FOR SURGERY/SELF PAY/BSF   Visit Type:  Follow-up Primary Provider:  [name], MD  CC:  right shoulder pain.  History of Present Illness: I saw Geoffrey Jackson in the office today for a followup visit.  He is a 45 years old man with the complaint of:  Right shoulder pain.  MR at [facility] 12/07/2006: Supraspinatus tendonosis with PASTA , Mod AC joint OA   Has had multiple injections, home exercises, PT, pain med for this shoulder.  Last injection was in June 2010 helped for a while.  Today wants to discuss having surgery right shoulder.  Vicodin 5 for pain He says it doesn't help.  The MRI in January of 2008 showed supraspinatus tendinosis intrasubstance partial thickness undersurface tear, a.c. joint arthrosis with narrowing of the subacromial outlet. Some fluid in the biceps tendon. and tenosynovitis.  we injected the subacromial space to give him time until May 31 when he wants to have the surgery. All see him on the 24th for the preop visit.  We plan to do an arthroscopy of the shoulder to do with the compression and then evaluate the distal clavicle for possible resection and an open repair. The rotator cuff as needed    Allergies: 1)  ! Prevacid  Review of Systems Neurologic:  Denies numbness and tingling.   Impression & Recommendations:  Problem # 1:  SHOULDER PAIN (ICD-719.41)   RIGHT shoulder subacromial space injection Verbal consent obtained/The shoulder was injected with depomedrol 40mg /cc and sensorcaine .25% . There were no complications  The following medications were removed from the medication list:    Lorcet Plus 7.5-650 Mg Tabs (Hydrocodone-acetaminophen) ..... One by mouth q 4 hrs as needed pain    Flexeril 5 Mg Tabs (Cyclobenzaprine hcl) .Marland Kitchen... 1 by mouth q 6 hrs prn His updated medication list for this problem includes:    Aspir-low 81 Mg Tbec (Aspirin) ..... One by mouth daily    Vicodin 5-500 Mg Tabs  (Hydrocodone-acetaminophen) ..... One by mouth q 4 hrs as needed pain  Orders: Est. Patient Level III (82956) Depo- Medrol 40mg  (J1030) Joint Aspirate / Injection, Large (20610)  Problem # 2:  IMPINGEMENT SYNDROME (ICD-726.2)  Orders: Est. 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 Patient Level III (65784)  Patient Instructions: 1)  will have surgery May 31  2)  preop on May 24th here in the office  3)  [No charge patient arrived for that day] 4)  You will have an Arthroscopy of the shoulder removal of 2 spurs and repair of the rotator cuff  5)  You have received an injection of cortisone today. You may experience increased pain at the injection site. Apply ice pack to the area for 20 minutes every 2 hours and take 2 xtra strength tylenol every 8 hours. This increased pain will usually resolve in 24 hours. The injection will take effect in 3-10 days.

## 2010-12-24 NOTE — Letter (Signed)
Summary: Lab reports/medical clearance from Dr. [name]  Lab reports/medical clearance from Dr. [name]   Imported By: Jacklynn Ganong 05/09/2010 12:08:13  _____________________________________________________________________  External Attachment:    Type:   Image     Comment:   External Document

## 2010-12-24 NOTE — Letter (Signed)
Summary: Out of Work  [facility]  [address]  [place], [place] [address]   Phone: [phone]  Fax: [phone]    September 26, 2010   Employee:  NATASHA PAULSON    To Whom It May Concern:   For Medical reasons, please continue out of work status regarding the above named employee from work through:      October 01, 2010 or until further notice     Next scheduled appointment:  October 01, 2010   If you need additional information, please feel free to contact our office.         Sincerely,    Terrance Mass, MD

## 2010-12-24 NOTE — Letter (Signed)
Summary: Out of Work  [facility]  [address]  [place], [place] [address]   Phone: [phone]  Fax: [phone]    June 27, 2010   Employee:  KAUSHAL VANNICE    To Whom It May Concern:   For Medical reasons, please excuse/continue full restrictions for the above named employee from work for the following dates:  Start:   06/27/10  End:   08/31/10, or until further notice.     If you need additional information, please feel free to contact our office.         Sincerely,    Terrance Mass, MD

## 2010-12-24 NOTE — Letter (Signed)
Summary: Generic Letter  [facility]  [address]  [place], [place] [address]   Phone: [phone]  Fax: [phone]    04/29/2010  Geoffrey Jackson [address] [place], [place]  [address]  1965/12/11 MR# [id number]  chief complaint RIGHT shoulder pain  History of present illness.  The patient injured himself E. STIR March of 2007.  He was rebounding a basketball and his arm went behind his head, someone reached for the ball hit his arm and his shoulder popped.  He did go the emergency room x-rays were normal.  He was treated for shoulder sprain and eventually had an MRI.  He was treated with physical therapy, multiple subacromial injections, oral pain medicines including Lorcet + Lorcet 10 mg and has failed to improve.  He was offered surgical treatment and put it off for several months trying to get his schedule conducive to surgery and is finally unable to do that.  Informed consent was done in the office and the patient understands the risks of surgery which include continued pain, stiffness, weakness.  His option for nonsurgical treatment was not satisfactory to him which included continued exercises, pain medication and physical therapy.  Review of systems he has a history of a lumbar surgery has some mild residual symptoms from that other than musculoskeletal system and some hypertension his systems were normal  He is ALLERGIC to no medications.  He's had foot and ankle surgery on the LEFT side these had back surgery twice.  His family history is negative.  Social history he is married.  Eyes grade level completed was 12.  He denies smoking or drinking.  His weight is close to 300 pounds respiratory rate 18, pulse 78.  Body habitus shows increased BMI.  Cardiovascular: Peripheral vascular system observation was normal palpation was normal  Lymph nodes cervical spine normal  Skin normal  Neurologic exam normal sensation in his upper  extremities coordination normal.  Reflex is normal.  He is alert he is awake he is oriented x3 his mood is pleasant.  Musculoskeletal: Ambulation is normal.  RIGHT shoulder: His passive range of motion is normal he is tender over his a.c. joint posterior acromion and anterolateral deltoid.  He has some pain with apprehension.  No instability is noted.  He has weakness in his rotator cuff supraspinatus.  His other extremities are within normal limits  Impression rotator cuff tendinosis, partial rotator cuff tear, a.c. joint arthrosis.  Plan arthroscopy RIGHT shoulder, removal of distal clavicle, possible open rotator cuff repair.   [name], MD

## 2010-12-24 NOTE — Progress Notes (Signed)
Summary: wants refill on Percocet  Phone Note Call from Patient Call back at [facility] Phone [phone]   Summary of Call: wants refill on Percocet 5, 05/31/10 surgery.  advised all refills need to be requested before Friday, Dr will not be in until Monday Initial call taken by: Ether Griffins,  June 14, 2010 10:10 AM  Follow-up for Phone Call        percocet declined   norco 10 / 325 1 q 4 as needed pain # 60 5 refills (60Days) Follow-up by: Fuller Canada MD,  June 17, 2010 9:32 AM    New/Updated Medications: NORCO 10-325 MG TABS (HYDROCODONE-ACETAMINOPHEN) 1 by mouth q 4 hrs as needed pain Prescriptions: NORCO 10-325 MG TABS (HYDROCODONE-ACETAMINOPHEN) 1 by mouth q 4 hrs as needed pain  #60 x 4   Entered by:   Ether Griffins   Authorized by:   Fuller Canada MD   Signed by:   Ether Griffins on 06/17/2010   Method used:   Historical   RxID:   [id number]

## 2010-12-24 NOTE — Letter (Signed)
Summary: Out of Work  [facility]  [address]  [place], [place] [address]   Phone: [phone]  Fax: [phone]    October 08, 2010   Employee:  Geoffrey Jackson    To Whom It May Concern:   For Medical reasons, please excuse the above named employee from work for the following dates:  Start:   04/29/10  End/Return to work full duty, no restrictions:   10/14/10   If you need additional information, please feel free to contact our office.         Sincerely,    Terrance Mass, MD

## 2010-12-24 NOTE — Progress Notes (Signed)
Summary: surgery scheduled for today 04/30/10, postponed  Phone Note Call from Patient   Caller: Patient Summary of Call: Patient called; states advised per Dr Romeo Apple to call our office.  He states his surgery originally scheduled for today, 04/30/10, was not done due to "BP too high and potassium too low."  Patient followed up with PCP Dr [name] this morning.  Surgery to be re-scheduled "possibly next week" if BP and potassium regulated.  Told note would be fwd'd to our nurse re: discussing re-schedule. Patient asking if Dr Romeo Apple already prescribed potassium medication - states told this while at hospital this morning.  His pharmacy is [facility] in [place].  Please advise if our office or primary care physician to prescribe. Initial call taken by: Cammie Sickle,  April 30, 2010 9:58 AM    New/Updated Medications: * K DUR 20 MEQ take 2 tabs now, 2 tabs in 6 hrs then 20 meq two times a day Prescriptions: K DUR 20 MEQ take 2 tabs now, 2 tabs in 6 hrs then 20 meq two times a day  #60 x 5   Entered and Authorized by:   Fuller Canada MD   Signed by:   Fuller Canada MD on 05/01/2010   Method used:   Faxed to ...       [facility]  [address] [address] [facility] (retail)       [address]       [place], [place]  [address]       Ph: [phone]       Fax: [phone]   RxID:   [id number]

## 2010-12-24 NOTE — Letter (Signed)
Summary: rpc chart  rpc chart   Imported By: Curtis Sites 09/05/2010 15:59:34  _____________________________________________________________________  External Attachment:    Type:   Image     Comment:   External Document

## 2010-12-24 NOTE — Assessment & Plan Note (Signed)
Summary: REVIEW MRI RESULTS+RE-CK RT SHOULDER UE:AVWU STATUS/POST OP 7...   Visit Type:  Follow-up Primary Provider:  [name], MD  CC:  neck and right shoulder.  History of Present Illness: I saw Geoffrey Jackson in the office today for a followup visit.  He is a 45 years old man with the complaint of:  neck and right shoulder.  SARS, debridement biceps tendon, debridement of RC, acromioplasty and bursectomy.  DOS 05/31/10.  Meds: Robaxin, Norco 10 and Ibuprofen 800mg   MRI results:  IMPRESSION: Mild degenerative disc disease of the cervical spine as detailed above without central canal or foraminal stenosis.  No nerve root compression.   Read By:  Charyl Dancer,  M.D.     Om was sent for surgical evaluation did have persistent numbness in his RIGHT upper extremity and shoulder as well as pain in the cervical spine and RIGHT paracervical musculature and rule out cervical component of this symptom.  He also has a clicking sensation in the RIGHT shoulder with certain range of motion. He has regained full. Cuff strength.  He does have some pain when the arm is brought down from an elevated position  Allergies: 1)  ! Prevacid  Physical Exam  Additional Exam:  examination reveals an awake, alert, oriented male, mood and affect. Normal general appearance well-groomed, normal hygiene.  He is cervical spine tenderness at the base of the cervical spine and superior aspect of the thorax. Has tenderness in the RIGHT paracervical musculature and also in the medial angle of the scapula. He has full forward elevation, grade 5 strength in his rotator cuff normal internal/external rotation, but he does have pain when he brings his arm down from 4 to elevation. Pulses good sensation is normal.     Impression & Recommendations:  Problem # 1:  DEGENERATIVE DISC DISEASE, CERVICAL SPINE (ICD-722.4) Assessment New  his MRI shows cervical disc disease was seen at C3-C4,  C4-C5, C5-C6. I think this is the source of his numbness. He also has some decompensation in the shoulder area with some scapular related symptoms. I think he probably has a trigger point at this medial border of the scapula. He also has this disc disease.  I think his shoulder is approximately 85% terms of improvement from the surgery. His cervical spine is contributing to the numbness.  We injected the superior border of the scapula with a cortisone shot for trigger point.  Orders: Est. Patient Level III ([address]81[address]) Joint Aspirate / Injection, Large (20610)/right shoulder trigger injection  Depo- Medrol 40mg  (J1030)  Verbal consent obtained/The right shoulder was injected with depomedrol 40mg /cc and sensorcaine .25% . There were no complications  Problem # 2:  AFTERCARE FOLLOW SURGERY MUSCULOSKEL SYSTEM NEC (ICD-V58.78) Assessment: Comment Only  Problem # 3:  IMPINGEMENT SYNDROME (ICD-726.2)  Orders: Est. Patient Level III ([address])  Patient Instructions: 1)  You have received an injection of cortisone today. You may experience increased pain at the injection site. Apply ice pack to the area for 20 minutes every 2 hours and take 2 xtra strength tylenol every 8 hours. This increased pain will usually resolve in 24 hours. The injection will take effect in 3-10 days.  2)  Continue your medications as prescribed  3)  Start new exercises for the Neck  4)  Return to work  5)  Next appointment in 3 months    Orders Added: 1)  Est. Patient Level III [95621] 2)  Joint Aspirate / Injection, Large [20610] 3)  Depo-  Medrol 40mg  [J1030]

## 2010-12-24 NOTE — Progress Notes (Signed)
Summary: holding on MRI,return visit to office scheduled  Phone Note Outgoing Call   Call placed to: Patient Summary of Call: Follow up with patient today re: MRI.  Patient still has no medical insurance therefore, holding on scheduling the MRI.  He is working with Engineer, site  and re-checking with [employer] financial assistance re: possible [employer] discount. He also would like to return to work. Appt scheduled for 10/01/10 to re-eval and discuss release back to work.  * Note entered w/Carol Foltz. Initial call taken by: Jacklynn Ganong,  September 26, 2010 4:40 PM

## 2010-12-24 NOTE — Medication Information (Signed)
Summary: Tax adviser   Imported By: Cammie Sickle 04/16/2010 13:55:14  _____________________________________________________________________  External Attachment:    Type:   Image     Comment:   External Document

## 2010-12-24 NOTE — Medication Information (Signed)
Summary: RX   RX   Imported By: Cammie Sickle 08/23/2010 21:55:59  _____________________________________________________________________  External Attachment:    Type:   Image     Comment:   External Document

## 2010-12-24 NOTE — Letter (Signed)
Summary: Out of Work  [facility]  [address]  [place], [place] [address]   Phone: [phone]  Fax: [phone]    August 29, 2010   Employee:  Geoffrey Jackson    To Whom It May Concern:   For Medical reasons, please excuse the above named employee from work for the following dates:   Per evaluation in our office today:  Start:   August 29, 2010  End:   September 29, 2010  Estimated Return to work date:  September 30, 2010 or until otherwise notified.   If you need additional information, please feel free to contact our office.         Sincerely,    Terrance Mass, MD

## 2010-12-24 NOTE — Letter (Signed)
Summary: rehab note  rehab note   Imported By: Curtis Sites 08/08/2010 09:27:08  _____________________________________________________________________  External Attachment:    Type:   Image     Comment:   External Document

## 2010-12-24 NOTE — Progress Notes (Signed)
Summary: MRI appt scheduled [facility] + fol/up appt scheduled   Phone Note Call from Patient   Caller: Patient Summary of Call: Patient has the 100% [employer] discount, per his conversation w/financial assistance and [employer].  Ms. Ladona Ridgel confirmed. Therefore, patient may have MRI (of C-spine) at [facility] (originally he had requested open MRI in [place]).  He is requesting pre-medication for MRI. His [facility] is [facility] in [place]. * Has other follow-up appointment in office NF:AOZH status pending MRI.   Advised patient we will update work note accordingly, as still out of work s/p surgery. Initial call taken by: Cammie Sickle,  September 30, 2010 11:02 AM  Follow-up for Phone Call        MRI appointment scheduled at [facility] for tomorrow 10/01/10 at 3:00pm, registration 2:30pm.  Advised patient of appointment and of office follow up appt here. Follow-up by: Cammie Sickle,  September 30, 2010 11:06 AM  Additional Follow-up for Phone Call Additional follow up Details #1::        valium 10mg  called in to [facility] in [place] advised pt to take 1 tablet by mouth 1 hr before MRI and to have someone go with him. Additional Follow-up by: Ether Griffins,  September 30, 2010 11:24 AM    Additional Follow-up for Phone Call Additional follow up Details #2::    Called patient to give all instructions including medication phone in information; he was actually on the way here to office.  Advised of all information noted.  Also gave an updated work status note to patient as he has requested. Follow-up by: Cammie Sickle,  September 30, 2010 11:51 AM  New/Updated Medications: VALIUM 10 MG TABS (DIAZEPAM) 1 by mouth 1 hr before MRI procedure Prescriptions: VALIUM 10 MG TABS (DIAZEPAM) 1 by mouth 1 hr before MRI procedure  #1 x 0   Entered by:   Ether Griffins   Authorized by:   Fuller Canada MD   Signed by:   Ether Griffins on 09/30/2010   Method used:   Telephoned to  ...       [facility] [address] [facility] (retail)       [address]       [place], [place]  [address]       Ph: [phone]       Fax: [phone]   RxID:   [id number]

## 2010-12-24 NOTE — Assessment & Plan Note (Signed)
Summary: 1 M RE-CK/POST OP RT SHOULDER SURG 05/31/10/[facility] +?RETRO MEDICA...   Visit Type:  Follow-up Primary Provider:     CC:  post op shoulder.  History of Present Illness:  SARS, debridement biceps tendon, debridement of RC, acromioplasty and bursectomy.  DOS 05/31/10.  Meds: Robaxin, Norco 10 and Ibuprofen. UPDATED TODAY   Today is one month recheck on right shoulder after HEP.  Still having pain, pain level today is 7.  No injury, feels popping in the shoulder and has pain with the popping over the anterior deltoid;  He also has pain in the shoulder blade and the trapezius and lateral neck   When raising arm has numbness in upper arm, triceps area feels tight and the pain runs down the triceps   neck xrays will be done today and order MRI C spine        Allergies: 1)  ! Prevacid  Past History:  Past Medical History: Last updated: 01/12/2007 GERD Hyperlipidemia Hypertension Low back pain Benign prostatic hypertrophy Abnormal Cardiolyte 01/01/07 - ant and inf ischemia with EF 48%  Past Surgical History: Last updated: 11/13/2008 LEFT ANKLE RECONSTRUCTION 1983 GANGLION CYST RIGHT WRIST 1996 VERTEBRAE REMOVAL 2006  Review of Systems Neurologic:  Complains of numbness and tingling. Musculoskeletal:  Complains of joint pain.  Physical Exam  Additional Exam:  Examination is interesting.    His neck exam shows that he has no tenderness in the midline of the cervical spine but has tenderness in the upper trapezius muscle and at the corner of the scapula and medial scapular.  He has pain when he rotates his neck to the LEFT and when he lateral bend to the LEFT.  He has decreased extension normal flexion  Space the shoulder exam shows normal internal/external rotation with his arm at his side normal forward elevation abduction to 90.  In the arch of 90-150 he has pain.  In abduction external rotation he has discomfort.  There is no instability.  His impingement  sign is mild to moderately positive.  He does have a clicking sensation in the shoulder on forward elevation.  His speed test was negative.  Normal grip strength is noted.  The internal and external rotators are grade 5 the supraspinatus is graded 5 minus over 5.   Impression & Recommendations:  Problem # 1:  DEGENERATIVE DISC DISEASE, CERVICAL SPINE (ICD-722.4) Assessment New  C SPINE XRAYS: 3 VIEWS  FINDINGS: C2-3 ANTERIOR OSTEOPHYTES AND C5-6 ANTERIOR OSTEOPHYTE  NO JOINT SPACE NARROWING; BUT LOSS OF CERVICAL LORDOSIS  IMPRESSION: CERVICAL SPINE DDD WITH SPURS AND LOSS OF CERVICAL CURVE    Orders: Est. Patient Level III ([address]) Cervical x-ray 2/3 views ([address])  Problem # 2:  AFTERCARE FOLLOW SURGERY MUSCULOSKEL SYSTEM NEC (ICD-V58.78) Assessment: Unchanged  as far as his shoulder goes he has painful arc of motion consistent with impingement even with the acromioplasty.  However he was somewhat noncompliant with his therapy and eventually stopped it and let them do a home exercise program.  Prior to surgery I felt he may have some instability but this did not bear out during the surgical exam.  So I'm unsure at this point why the arc of motion between 90 and 150 still bothers him.  The cervical spine issues as noted on x-rays above will be worked up with an MRI this may shed some light on the cause of his numbness and neck pain.  Orders: Post-Op Check [id number]  Patient Instructions: 1)  RETURN WITH MRI  [facility]  2)  NOTE FOR WORK:  3)  NO WORK FOR 4 WEEKS

## 2010-12-24 NOTE — Miscellaneous (Signed)
Summary: OT Progress note  OT Progress note   Imported By: Jacklynn Ganong 07/01/2010 11:14:58  _____________________________________________________________________  External Attachment:    Type:   Image     Comment:   External Document

## 2011-01-09 ENCOUNTER — Encounter: Payer: Self-pay | Admitting: Orthopedic Surgery

## 2011-01-09 ENCOUNTER — Ambulatory Visit (INDEPENDENT_AMBULATORY_CARE_PROVIDER_SITE_OTHER): Payer: Self-pay | Admitting: Orthopedic Surgery

## 2011-01-09 DIAGNOSIS — M25519 Pain in unspecified shoulder: Secondary | ICD-10-CM

## 2011-01-09 DIAGNOSIS — M5030 Other cervical disc degeneration, unspecified cervical region: Secondary | ICD-10-CM

## 2011-01-15 NOTE — Assessment & Plan Note (Signed)
Summary: 3 MOS RECK NECK/SHOULDER/?[facility] DISCOUNT//[facility]F   Visit Type:  Follow-up Primary Provider:  [name], MD  CC:  recheck right shoulder.  History of Present Illness: I saw [name] in the office today for a followup visit.  He is a 45 years old man with the complaint of:  neck and right shoulder.  SARS, debridement biceps tendon, debridement of RC, acromioplasty and bursectomy.  DOS 05/31/10.  Meds: Robaxin, Norco 10 and Ibuprofen 800mg      He was last seen 3 months ago. He says he is slowly getting better. The numbness that he was feeling seems to have gotten better. He still has the click in the shoulder with rotation in abduction external rotation, and it appears to be coming from the subacromial space. This is most likely some scarring in the subacromial area.  He has excellent strength full range of motion except for adduction.  We recommended he start pool therapy. He was having some knotting up of the scapula stabilizers and we advised him Flexeril. He is taking 1-2 hydrocodone, 10 mg tablets per day. She can continue.  He will call us on an as-needed basis    Allergies: 1)  ! Prevacid   Impression & Recommendations:  Problem # 1:  SHOULDER PAIN (ICD-719.41) Assessment Improved  His updated medication list for this problem includes:    Aspir-low 81 Mg Tbec (Aspirin) ..... One by mouth daily    Vicodin 5-500 Mg Tabs (Hydrocodone-acetaminophen) ..... One by mouth q 4 hrs as needed pain    Norco 10-325 Mg Tabs (Hydrocodone-acetaminophen) .Marland Kitchen... 1 by mouth q 4 hrs as needed pain    Flexeril 10 Mg Tabs (Cyclobenzaprine hcl) .Marland Kitchen... 1 by mouth q 8 hrs as needed  Orders: Est. Patient Level II ([address])  Problem # 2:  DEGENERATIVE DISC DISEASE, CERVICAL SPINE (ICD-722.4) Assessment: Comment Only  stable  Orders: Est. Patient Level II ([address])  Medications Added to Medication List This Visit: 1)  Flexeril 10 Mg Tabs (Cyclobenzaprine hcl) .Marland Kitchen.. 1 by  mouth q 8 hrs as needed  Patient Instructions: 1)  Please schedule a follow-up appointment as needed. 2)  continue exercises on your shoulder  Prescriptions: FLEXERIL 10 MG TABS (CYCLOBENZAPRINE HCL) 1 by mouth q 8 hrs as needed  #60 x 2   Entered and Authorized by:   Fuller Canada MD   Signed by:   Fuller Canada MD on 01/09/2011   Method used:   Print then Give to Patient   RxID:   [id number] NORCO 10-325 MG TABS (HYDROCODONE-ACETAMINOPHEN) 1 by mouth q 4 hrs as needed pain  #60 x 4   Entered and Authorized by:   Fuller Canada MD   Signed by:   Fuller Canada MD on 01/09/2011   Method used:   Print then Give to Patient   RxID:   [id number]    Orders Added: 1)  Est. Patient Level II [41324]

## 2011-01-30 NOTE — Letter (Signed)
Summary: [facility] 100% financ discount 07/23/10-01/21/11  [facility] 100% financ discount 07/23/10-01/21/11   Imported By: Cammie Sickle 01/21/2011 11:41:00  _____________________________________________________________________  External Attachment:    Type:   Image     Comment:   External Document

## 2011-02-09 LAB — SURGICAL PCR SCREEN
MRSA, PCR: NEGATIVE
Staphylococcus aureus: NEGATIVE

## 2011-02-10 LAB — BASIC METABOLIC PANEL
BUN: 16 mg/dL (ref 6–23)
CO2: 26 mEq/L (ref 19–32)
Creatinine, Ser: 1.35 mg/dL (ref 0.4–1.5)
GFR calc Af Amer: >60 mL/min (ref >60)
Glucose, Bld: 170 mg/dL — ABNORMAL HIGH (ref 70–99)
Potassium: 3.3 mEq/L — ABNORMAL LOW (ref 3.5–5.1)

## 2011-02-10 LAB — CBC
Platelets: 171 10*3/uL (ref 150–400)
RDW: 13.8 % (ref 11.5–15.5)

## 2011-02-10 LAB — POCT I-STAT 4, (NA,K, GLUC, HGB,HCT)
HCT: 46 % (ref 39.0–52.0)
Potassium: 3.2 mEq/L — ABNORMAL LOW (ref 3.5–5.1)
Sodium: 142 mEq/L (ref 135–145)

## 2011-02-12 LAB — DIFFERENTIAL
Basophils Absolute: 0 10*3/uL (ref 0.0–0.1)
Eosinophils Relative: 1 % (ref 0–5)
Monocytes Relative: 10 % (ref 3–12)
Neutrophils Relative %: 55 % (ref 43–77)

## 2011-02-12 LAB — URINALYSIS, ROUTINE W REFLEX MICROSCOPIC
Bilirubin Urine: NEGATIVE
Hgb urine dipstick: NEGATIVE
Urobilinogen, UA: 0.2 mg/dL (ref 0.0–1.0)

## 2011-02-12 LAB — COMPREHENSIVE METABOLIC PANEL
Albumin: 4 g/dL (ref 3.5–5.2)
Alkaline Phosphatase: 78 U/L (ref 39–117)
BUN: 13 mg/dL (ref 6–23)
Calcium: 9 mg/dL (ref 8.4–10.5)
Creatinine, Ser: 1.15 mg/dL (ref 0.4–1.5)
GFR calc Af Amer: >60 mL/min (ref >60)
GFR calc non Af Amer: >60 mL/min (ref >60)
Glucose, Bld: 106 mg/dL — ABNORMAL HIGH (ref 70–99)
Sodium: 143 mEq/L (ref 135–145)
Total Bilirubin: 0.8 mg/dL (ref 0.3–1.2)

## 2011-02-12 LAB — LIPASE, BLOOD: Lipase: 34 U/L (ref 11–59)

## 2011-02-12 LAB — CBC
Hemoglobin: 16 g/dL (ref 13.0–17.0)
MCHC: 33.2 g/dL (ref 30.0–36.0)
MCV: 91.8 fL (ref 78.0–100.0)
WBC: 4 10*3/uL (ref 4.0–10.5)

## 2011-03-04 LAB — URINE CULTURE
Colony Count: NO GROWTH
Culture: NO GROWTH

## 2011-03-04 LAB — DIFFERENTIAL
Basophils Absolute: 0 10*3/uL (ref 0.0–0.1)
Basophils Relative: 1 % (ref 0–1)
Eosinophils Absolute: 0 10*3/uL (ref 0.0–0.7)
Eosinophils Relative: 0 % (ref 0–5)
Lymphocytes Relative: 24 % (ref 12–46)
Lymphs Abs: 1.5 10*3/uL (ref 0.7–4.0)
Neutro Abs: 4.3 10*3/uL (ref 1.7–7.7)
Neutrophils Relative %: 68 % (ref 43–77)

## 2011-03-04 LAB — URINALYSIS, ROUTINE W REFLEX MICROSCOPIC
Bilirubin Urine: NEGATIVE
Glucose, UA: NEGATIVE mg/dL
Ketones, ur: NEGATIVE mg/dL
Nitrite: NEGATIVE
pH: 6 (ref 5.0–8.0)

## 2011-03-04 LAB — CBC
HCT: 44.6 % (ref 39.0–52.0)
Hemoglobin: 15.4 g/dL (ref 13.0–17.0)
MCHC: 34.5 g/dL (ref 30.0–36.0)
RDW: 14 % (ref 11.5–15.5)
WBC: 6.3 10*3/uL (ref 4.0–10.5)

## 2011-03-04 LAB — COMPREHENSIVE METABOLIC PANEL
AST: 22 U/L (ref 0–37)
Albumin: 4.1 g/dL (ref 3.5–5.2)
Alkaline Phosphatase: 83 U/L (ref 39–117)
Creatinine, Ser: 1.26 mg/dL (ref 0.4–1.5)
GFR calc Af Amer: >60 mL/min (ref >60)
GFR calc non Af Amer: >60 mL/min (ref >60)
Potassium: 3 mEq/L — ABNORMAL LOW (ref 3.5–5.1)

## 2011-03-04 LAB — GLUCOSE, CAPILLARY: Glucose-Capillary: 91 mg/dL (ref 70–99)

## 2011-03-04 LAB — LIPASE, BLOOD: Lipase: 29 U/L (ref 11–59)

## 2011-03-05 LAB — COMPREHENSIVE METABOLIC PANEL
AST: 31 U/L (ref 0–37)
Albumin: 3.9 g/dL (ref 3.5–5.2)
Alkaline Phosphatase: 89 U/L (ref 39–117)
Chloride: 108 mEq/L (ref 96–112)
GFR calc Af Amer: >60 mL/min (ref >60)
Potassium: 2.8 mEq/L — ABNORMAL LOW (ref 3.5–5.1)
Sodium: 139 mEq/L (ref 135–145)
Total Bilirubin: 0.7 mg/dL (ref 0.3–1.2)

## 2011-03-05 LAB — DIFFERENTIAL
Basophils Absolute: 0 10*3/uL (ref 0.0–0.1)
Basophils Relative: 1 % (ref 0–1)
Eosinophils Relative: 1 % (ref 0–5)
Monocytes Absolute: 0.6 10*3/uL (ref 0.1–1.0)
Monocytes Relative: 10 % (ref 3–12)

## 2011-03-05 LAB — URINALYSIS, ROUTINE W REFLEX MICROSCOPIC
Bilirubin Urine: NEGATIVE
Glucose, UA: NEGATIVE mg/dL
Protein, ur: NEGATIVE mg/dL
Specific Gravity, Urine: 1.01 (ref 1.005–1.030)
Urobilinogen, UA: 0.2 mg/dL (ref 0.0–1.0)

## 2011-03-05 LAB — CBC
Platelets: 194 10*3/uL (ref 150–400)
WBC: 5.9 10*3/uL (ref 4.0–10.5)

## 2011-03-24 ENCOUNTER — Other Ambulatory Visit: Payer: Self-pay | Admitting: Orthopedic Surgery

## 2011-03-24 NOTE — Telephone Encounter (Signed)
Waiting on rx request by fax

## 2011-03-25 ENCOUNTER — Other Ambulatory Visit: Payer: Self-pay | Admitting: Orthopedic Surgery

## 2011-03-25 NOTE — Telephone Encounter (Signed)
Waiting on pharmacy to fax over hard copy

## 2011-03-25 NOTE — Telephone Encounter (Signed)
Waiting on hard copy from pharmacy 

## 2011-03-26 ENCOUNTER — Other Ambulatory Visit: Payer: Self-pay | Admitting: *Deleted

## 2011-03-26 DIAGNOSIS — M25519 Pain in unspecified shoulder: Secondary | ICD-10-CM

## 2011-03-26 MED ORDER — HYDROCODONE-ACETAMINOPHEN 7.5-325 MG PO TABS: 1.0000 | ORAL_TABLET | ORAL | Status: DC | PRN

## 2011-04-11 NOTE — Procedures (Signed)
NAME:  Geoffrey Jackson, Geoffrey Jackson            ACCOUNT NO.:  [id number]   MEDICAL RECORD NO.:  [id number]          PATIENT TYPE:  OUT   LOCATION:  SLEEP LAB                     FACILITY:  [facility]   PHYSICIAN:  Barbaraann Share, MD,FCCPDATE OF BIRTH:  1966-10-16   DATE OF STUDY:  12/26/2006                            NOCTURNAL POLYSOMNOGRAM   INDICATIONS:  Hypersomnia with sleep apnea.  Epworth score:  5.   SLEEP ARCHITECTURE:  The patient had total sleep time of 295 minutes  with very little slow wave sleep and significant decrease in REM.  Sleep  onset latency was normal and REM onset was greatly prolonged at 239  minutes.  Sleep efficiency was decreased at 76%.   RESPIRATORY DATA:  The patient underwent split night study where he was  found to have 75 obstructive events in the first 97 minutes of sleep.  This gave him an extrapolated respiratory disturbance index of 47 events  per hour.  Events were not positional, but there was loud to very loud  snoring noted throughout.  By protocol, the patient was then placed on a  ResMed small nasal pillow device and ultimately titrated to a final  pressure 10 cm with excellent control of the obstructive events and  snoring.   OXYGEN DATA:  There was O2 desaturation as low as 86% with the patient's  obstructive events.   CARDIAC DATA:  No clinically significant cardiac arrhythmias were noted.   MOVEMENT/PARASOMNIA:  Occasional leg jerks with no clinically  significant sleep disruption.   IMPRESSION/RECOMMENDATIONS:  1. Split night study reveals severe obstructive sleep apnea/hypopnea      syndrome with a respiratory disturbance index of 47 events per hour      over the first half of the night and O2 desaturation as low as 86%.      The patient was then placed on CPAP with a ResMed small nasal      pillow device and ultimately titrated to a final pressure of 10 cm      with excellent control.  The patient should also be encouraged to      were  questionably on weight loss.      Barbaraann Share, MD,FCCP  Diplomate, American Board of Sleep  Medicine  Electronically Signed     [facility]  D:  01/14/2007 18:09:22  T:  01/15/2007 06:30:23  Job:  [id number]

## 2011-04-11 NOTE — Discharge Summary (Signed)
NAME:  Geoffrey Jackson, Geoffrey Jackson            ACCOUNT NO.:  [id number]   MEDICAL RECORD NO.:  [id number]          PATIENT TYPE:  INP   LOCATION:  3014                         FACILITY:  [facility]   PHYSICIAN:  Stefani Dama, M.D.  DATE OF BIRTH:  12/25/1965   DATE OF ADMISSION:  05/12/2005  DATE OF DISCHARGE:  05/16/2005                                 DISCHARGE SUMMARY   ADMITTING DIAGNOSIS:  Thoracic spondylosis with myelopathy, T6-7, T7-T8.   DISCHARGE AND FINAL DIAGNOSIS:  Thoracic spondylosis with myelopathy, T6-7,  T7-T8.   OPERATION:  Lumbar laminectomy, T5, T6, T7, T8, with decompression of the  spinal cord.   CONDITION ON DISCHARGE:  Improving.   HOSPITAL COURSE:  Geoffrey Jackson is a 45 year old individual who has had  significant back and bilateral lower extremity pain, and pain around the  abdomen and the trunk.  He was found to have a significant problem with  posterior overgrowth of the laminar arches secondary to a hyperostotic  lesion; this was worst, causing cord compression at T6-T7 and T7-T8.  After  careful consideration of his options, I advised strongly for surgical  decompression of these lesions.  He was brought to the operating room on the  19th and this procedure was undertaken.  Postoperatively, he has had  exacerbation of pain around his trunk area and he knows that there is a  dysesthetic sensation near the surface of the skin.  Most of the pain he  feels is deep.  He has had 2 previous abdominal CT scans which did not show  any intra-abdominal pathology.  He has felt this type of pain is similar to  what he had experienced previously.  He has not had any problems with bowel  gas or any other problems referable to his GI tract, but this process has  continued to be substantial.  A CT scan was performed during the  postoperative period which demonstrated that he had adequate decompression  of the osteophytic lesions at each of the levels.  At the current time, the  patient's incision is clean and dry.   ACTIVITY:  He has been advised regarding his postoperative activities.   FOLLOWUP:  He will be seen in the office in approximately 3 weeks' time for  further followup.  In the meantime, he will be seen in the office next week  for his staple removal.   DISCHARGE MEDICATIONS:  He has been given a prescription for Percocet, #60  without refills, and Valium 5 mg, #40 without refills.       HJE/MEDQ  D:  05/15/2005  T:  05/16/2005  Job:  [id number]

## 2011-04-11 NOTE — Op Note (Signed)
NAME:  Geoffrey Jackson, Geoffrey Jackson            ACCOUNT NO.:  [id number]   MEDICAL RECORD NO.:  [id number]          PATIENT TYPE:  INP   LOCATION:  3014                         FACILITY:  [facility]   PHYSICIAN:  Stefani Dama, M.D.  DATE OF BIRTH:  02-Jan-1966   DATE OF PROCEDURE:  05/12/2005  DATE OF DISCHARGE:                                 OPERATIVE REPORT   PREOPERATIVE DIAGNOSIS:  Spondylosis with myelopathy, T6-7 and T7-T8.   POSTOPERATIVE DIAGNOSIS:  Spondylosis with myelopathy, T6-7 and T7-T8.   OPERATION:  T6-T7 and partial T8 laminectomy with operating microscope,  microdissection technique, decompression of spinal cord.   SURGEON:  Stefani Dama, M.D.   FIRST ASSISTANT:  Coletta Memos, M.D.   ANESTHESIA:  General endotracheal.   INDICATIONS:  Mr. Geoffrey Jackson is a 45 year old individual who has had  significant evidence of myelopathy in the lower extremities with progressive  weakness, dysesthesias in the lower extremities and around the abdomen and  trunk.  He did have a recent case of prostatitis and this had been treated  with p.o. antibiotics; however, the patient complained of continued pain in  the abdomen and lower trunk, and into the lower extremities.  An MRI  demonstrated the presence of a posterior calcification from the region of  lamina and the yellow ligament involving  the T7-T8 and T6-T7 areas.   PROCEDURE:  The patient was brought to the operating room supine on a  stretcher.  After smooth induction of general endotracheal anesthesia, he  was turned prone and the back was prepped with DuraPrep and draped in a  sterile fashion.  A midline incision was carried out and this was carried  down to the spinous processes which were identified at T6, T7 and T8 with a  singular PA radiograph.  The thoracodorsal fascia overlying this area was  then incised on either side.  It should be noted that prior to the incision,  the back was infiltrated with a total of 30 mL of  lidocaine with epinephrine  1% mixed 50/50 with 0.5% Marcaine.  The paraspinous musculature was peeled  off either side of the midline to expose T6, T7 and the top of the T8  vertebra.  Then at the T6-T7 junction, a laminectomy was performed removing  the inferior margin of the spinous process of T6 and then the entirety of  the laminar arch of T7.  In the T6-T7 interspace, there was noted be a  significant bony protuberance; this area was drilled carefully around the  perimeter so as to remove in a single piece, a large overgrown osteophyte  that was compressing the spinal cord on the most ventral aspect.  The  dissection was done carefully and painstakingly with a 2.8-mm dissecting  tool on a matchstick bur and in the end when all the bone was released, it  was removed in a singular fragment.  A large portion of the osteophyte had  extended out to the right side, which was stuck under the laminar arch and  was separate from it.  The left side was drilled down so as to allow  movement of the bony exostosis off to the midline from the right side.  This  was sent as a specimen.  Then a T7 laminectomy was undertaken by removing a  large portion of the laminar arch after the base of the spine was removed.  The superior portion of T8 was undercut substantially using a high-speed bur  and 2.8-mm dissecting tool.  A similar procedure was carried out here and a  large bony exostosis was removed in a singular piece with substantial  bleeding from epidural veins in this area and once these were tamponaded  with Gelfoam, the operating microscope was draped and brought into the  field, and then doing a careful dissection, the veins were individually  cauterized and divided and cut back so as to allow exposure of the common  dural tube.  This procedure was done around the perimetry of the wound and  this allowed for good exposure of the dura from the mid-portion of T6 down  to the mid-portion of the T8  areas.  Once adequate hemostasis was achieved,  it was noted that the spinal cord itself was well-decompressed.  No areas of  dural injury were identified and once the soft tissue hemostasis was  obtained, the wound was closed with the thoracodorsal fascia being closed  with #1 Vicryl loosely, the fascia itself was closed with #1 Vicryl tightly  and all the closure was done over a small Hemovac drain which was brought  out through separate stab incision, then the subcutaneous fascia was closed  with #1 Vicryl and 2-0 Vicryl was used on the subcutaneous tissues and  surgical staples were used in the skin.  The stay suture was tied down with  3-0 nylon.  A dry sterile dressing was applied over Betadine ointment and  the patient tolerated the procedure well.  Blood loss was estimated at 400  mL.       HJE/MEDQ  D:  05/12/2005  T:  05/13/2005  Job:  [id number]

## 2011-04-11 NOTE — Consult Note (Signed)
NAME:  Geoffrey Jackson NO.:  [id number]   MEDICAL RECORD NO.:  [id number]          PATIENT TYPE:  EMS   LOCATION:  MAJO                         FACILITY:  [facility]   PHYSICIAN:  Marlan Palau, M.D.  DATE OF BIRTH:  11/29/1965   DATE OF CONSULTATION:  05/05/2005  DATE OF DISCHARGE:                                   CONSULTATION   HISTORY OF PRESENT ILLNESS:  Geoffrey Jackson is a 45 year old right-  handed black male, born 08-23-1966, with a history of obesity,  hypertension.  This patient was diagnosed with prostatitis two weeks ago,  started on Cipro, and has been given a 2-3 week course of this medication.  The patient has noted problems with his low back for greater than a year and  has over the last two months, had increased pain going from the back through  into the abdomen.  The patient has over the last two weeks noted that his  bladder was not working right, that he was trouble voiding the bladder, that  he had pain with voiding and with having a bowel movement.  The patient has  also had some numb sensations going from the buttocks down into the legs on  both sides all the way to the feet.  The patient feels somewhat weak and  staggery, has not had any falls.  The patient has had trouble with  constipation, has had some pain with bowel movements.  The patient is not  clear that he is running fevers persay, but feels hot at times, denies any  sweats.  The patient comes to the [facility] Emergency Room for an  evaluation.  Lumbosacral MRI scan had been done previously, does show  bulging disk at the L5-S1 level.  No evidence of a significant spinal  stenosis.  Conus medullaris appears to be normal on the scan.  This was done  without contrast.  The patient does not note a true sensory level persay.  Neurology is asked to see this patient for further evaluation.   PAST MEDICAL HISTORY:  1.  Significant for onset of low back pain for over a year.  2.   Recent problems with prostatitis.  3.  Obesity.  4.  Hypertension.  5.  Ganglion cyst resection on the right in the past.  6.  History of left ankle surgery.   MEDICATIONS:  1.  Vicodin if needed.  2.  Lotensin, unknown dose, one a day.  3.  Toprol 50 mg daily.  4.  Cipro.  5.  Aspirin therapy.  6.  Motrin 800 mg tablets one up to three times a day.   ALLERGIES:  The patient has no known allergies.   HABITS:  He does not smoke or drink.  The patient denies use of illicit  drugs such as cocaine or marijuana.   SOCIAL HISTORY:  The patient is married, lives in the [place], [place]  [place] area.  He has six children who are alive and well.  The patient  works as a Museum/gallery exhibitions officer.   FAMILY HISTORY:  Problems in that mother died  with a bicerebellar ataxia.  Father is alive, has history of diabetes.  The patient has one brother and  one sister, full siblings.  He has a multitude of half brothers and sisters  that he does not know about.   REVIEW OF SYSTEMS:  Noted for no definite fevers, chills.  The patient notes  occasional headaches, denies visual changes, speech changes.  The patient  has no troubles with shortness of breath or chest pain.  He does have some  abdominal discomfort.  He feels some numbness around the groin down both  legs, tingly sensations.  The patient has no blackouts or passing out  spells.   PHYSICAL EXAMINATION:  VITAL SIGNS:  Blood pressure 169/111, heart rate 79,  respiratory rate 16, temperature afebrile.  GENERAL:  This patient is a moderately obese black male who is alert and  cooperative at the time of examination.  HEENT:  Head is atraumatic.  Eyes:  Pupils are equal, round, and reactive to  light.  Discs are flat bilaterally.  NECK:  Supple.  No carotid bruits noted.  RESPIRATORY:  Examination is clear.  CARDIOVASCULAR:  Reveals a regular rate and rhythm, no obvious murmurs or  rubs noted.  EXTREMITIES:  Without significant edema.  ABDOMEN:   Reveals positive bowel sounds.  No organomegaly or tenderness  noted.  RECTAL:  Rectal examination was performed, does reveal evidence of  a very  tender prostate gland.  The patient notes severe pain with palpation of the  prostate.  The patient has normal rectal tone, full control of the rectal  sphincter.  NEUROLOGIC:  Cranial nerves as above.  Facial symmetry is present.  The  patient has good sensation of face to pinprick, soft touch bilaterally.  He  has good strength of facial muscles and muscles of head turning and shoulder  shrug bilaterally.  Speech is well-enunciated and not aphasic.  Motor  testing reveals 5/5 strength in all fours, good symmetric motor tone is  __________.  Sensory testing reveals some patchy sensory alteration.  One  side of the neck, no definite sensory level is noted.  Overall the patient  has a slight depressed sensation on the right leg as compared to the left.  The patient has full symmetric reflexes throughout.  Toes are definitely  downgoing bilaterally.  Again normal strength is noted throughout.  The  patient is able to ambulate, can walk on the heels and toes.  He has  negative Romberg, no drift is seen.  The patient has full range of movement  of the low pack.  No significant pain to palpation of the low back is noted.   The patient has not had any blood work done.   IMPRESSION:  1.  History of chronic low back pain.  2.  More acute back pain, sensory alterations of the groin and the legs.  3.  History of prostatitis.   This patient really has no clear evidence of a myelopathy on examination,  does report a lot of pain in new sensory senses that seem to have come along  with onset of bout of prostatitis.  The patient has had a rectal examination  today that shows normal rectal tone but the patient clearly has severe pain with palpation of the prostate gland.  I suspect that the majority of his  new symptoms are related to this.   PLAN:  1.   I will obtain an MRI scan of the thoracic spine with Gadolinium to  ensure that a thoracic myelopathy has not been missed.  2.  If the above study is unremarkable would continue treatment for      prostatitis.  The patient may follow up with [facility] for his pain syndrome once the prostatitis is fully treated.      Unless significant abnormalities  are noted by MRI scan tonight, I do      not think that the patient requires admission.       CKW/MEDQ  D:  05/05/2005  T:  05/05/2005  Job:  [id number]   cc:   Stefani Dama, M.D.  [address]  [place]  [place] [address]  Fax: [phone]   Dorthula Rue. Early Chars, MD  Fax: [phone]

## 2011-04-22 ENCOUNTER — Other Ambulatory Visit: Payer: Self-pay | Admitting: *Deleted

## 2011-04-22 DIAGNOSIS — G8929 Other chronic pain: Secondary | ICD-10-CM

## 2011-04-22 MED ORDER — HYDROCODONE-ACETAMINOPHEN 7.5-325 MG PO TABS: 1.0000 | ORAL_TABLET | ORAL | Status: DC | PRN

## 2011-05-15 ENCOUNTER — Other Ambulatory Visit: Payer: Self-pay | Admitting: Orthopedic Surgery

## 2011-05-16 ENCOUNTER — Other Ambulatory Visit: Payer: Self-pay | Admitting: *Deleted

## 2011-05-16 DIAGNOSIS — G8929 Other chronic pain: Secondary | ICD-10-CM

## 2011-05-16 MED ORDER — HYDROCODONE-ACETAMINOPHEN 7.5-500 MG PO TABS: 1.0000 | ORAL_TABLET | Freq: Four times a day (QID) | ORAL | Status: AC | PRN

## 2011-05-16 NOTE — Telephone Encounter (Signed)
Lortab 7.5/500 for now, pharmacy was out of Norco 7.5/325 gave enough for a week until Norco 7.5 can be given from [facility] in [place]

## 2011-05-27 ENCOUNTER — Ambulatory Visit (INDEPENDENT_AMBULATORY_CARE_PROVIDER_SITE_OTHER): Payer: Self-pay | Admitting: Orthopedic Surgery

## 2011-05-27 ENCOUNTER — Encounter: Payer: Self-pay | Admitting: Orthopedic Surgery

## 2011-05-27 DIAGNOSIS — M25519 Pain in unspecified shoulder: Secondary | ICD-10-CM

## 2011-05-27 DIAGNOSIS — G8929 Other chronic pain: Secondary | ICD-10-CM

## 2011-05-27 MED ORDER — HYDROCODONE-ACETAMINOPHEN 7.5-325 MG PO TABS: 1.0000 | ORAL_TABLET | ORAL | Status: DC | PRN

## 2011-05-27 NOTE — Progress Notes (Signed)
   This is a followup visit. Status post RIGHT shoulder arthroscopy.  Using continues to have chronic shoulder pain and crepitance in the RIGHT shoulder, especially with internal rotation, and external rotation. He has some weakness, but it is mild.  He also has some pain in the medial border of the scapula with a intermittent muscle spasms with a history of thoracic disc disease,  He's already had one spinal procedure in the past and he also complains of some LEFT lower back pain and LEFT lateral and posterior leg and knee pain.  He has forward elevation of the RIGHT shoulder 150 the other side goes to 180. When his arm is in 90 of flexion and internally and externally rotated. There is crepitance in the bicipital groove and anterolateral acromion. He has negative impingement sign. He has some pain with abduction external rotation, but no instability.  Knee exam was normal.  Straight leg raise was negative.  Impression: #1 chronic RIGHT shoulder pain and crepitance, most likely, subacromial bursitis #2 thoracic disc disease #3 hamstring tightness #4 lower back, pain. We've asked him to talk to his neurosurgeon regarding that.  Followup in 3 months.  I advised that he should probably have arthroscopic surgery of the RIGHT shoulder to clean out the bursa and scar tissue. He says that he can do that. Right now due to financial constraints.  So, he would like to wait.  Continue hydrocodone 7.5 mg, which seems to control his pain

## 2011-08-11 ENCOUNTER — Other Ambulatory Visit: Payer: Self-pay | Admitting: Orthopedic Surgery

## 2011-08-11 NOTE — Telephone Encounter (Signed)
Need to get hard copy of rx request for pain med, cant do electronically.

## 2011-08-14 ENCOUNTER — Telehealth: Payer: Self-pay | Admitting: Orthopedic Surgery

## 2011-08-14 NOTE — Telephone Encounter (Signed)
Patient called to speak with nurse about denial of prescription.  York Spaniel "wants to know what's going on." Cell # [phone]

## 2011-08-14 NOTE — Telephone Encounter (Signed)
I will watch for fax.  As of 12:35pm, fax has not yet been received.

## 2011-08-14 NOTE — Telephone Encounter (Signed)
Called patient and advised to get pharmacy to fax [place]

## 2011-08-14 NOTE — Telephone Encounter (Signed)
He needs to call pharmacy and get rx faxed over on the fax machine, not electronically.

## 2011-08-18 ENCOUNTER — Other Ambulatory Visit: Payer: Self-pay | Admitting: Orthopedic Surgery

## 2011-08-18 DIAGNOSIS — M25519 Pain in unspecified shoulder: Secondary | ICD-10-CM

## 2011-08-18 LAB — BASIC METABOLIC PANEL
CO2: 28
Calcium: 9.1
Creatinine, Ser: 1.1
GFR calc Af Amer: >60
Glucose, Bld: 257 — ABNORMAL HIGH

## 2011-08-18 LAB — DIFFERENTIAL
Basophils Absolute: 0
Basophils Relative: 1
Neutro Abs: 4
Neutrophils Relative %: 56

## 2011-08-18 LAB — CBC
MCHC: 33.8
RBC: 4.69
RDW: 14

## 2011-08-18 MED ORDER — HYDROCODONE-ACETAMINOPHEN 7.5-325 MG PO TABS: 1.0000 | ORAL_TABLET | ORAL | Status: DC | PRN

## 2011-08-19 LAB — URINALYSIS, ROUTINE W REFLEX MICROSCOPIC
Nitrite: NEGATIVE
Specific Gravity, Urine: >1.03 — ABNORMAL HIGH
Urobilinogen, UA: 0.2

## 2011-08-28 ENCOUNTER — Ambulatory Visit (INDEPENDENT_AMBULATORY_CARE_PROVIDER_SITE_OTHER): Payer: Self-pay | Admitting: Orthopedic Surgery

## 2011-08-28 ENCOUNTER — Encounter: Payer: Self-pay | Admitting: Orthopedic Surgery

## 2011-08-28 VITALS — BP 160/112 | Ht 73.0 in | Wt 270.8 lb

## 2011-08-28 DIAGNOSIS — M719 Bursopathy, unspecified: Secondary | ICD-10-CM

## 2011-08-28 DIAGNOSIS — M75101 Unspecified rotator cuff tear or rupture of right shoulder, not specified as traumatic: Secondary | ICD-10-CM

## 2011-08-28 MED ORDER — IBUPROFEN 800 MG PO TABS: 800.0000 mg | ORAL_TABLET | Freq: Three times a day (TID) | ORAL | Status: DC | PRN

## 2011-08-28 NOTE — Patient Instructions (Signed)
Continue pain medication as needed

## 2011-08-28 NOTE — Progress Notes (Signed)
Followup visit  Status post arthroscopy RIGHT shoulder several months ago history of cervical disc disease  Complaints of neck and shoulder pain.  Has good days and bad days.  The pain seems to be positional and along the posterior joint line.  Review of systems no catching locking or giving way.  Mild neck pain.  Pain in the medial border of the scapula.  I think he has a history of thoracic disc as well  Status post lumbar disc surgery  Exam of the shoulder inspection no swelling.  Palpation no tenderness.  Flexion is 130.  He has some posterior pain with abduction external rotation.  No instability.  Strength of the cuff is normal.  Skin is intact pulses are good sensation is normal  Cervical spine x-ray continues to show degenerative disc disease with flattening of the normal cervical contour.  This matches his MRI from 2011.  He is to continue hydrocodone and ibuprofen for pain  Followup in April for May to preop for repeat shoulder arthroscopy to clean out and evaluate the posterior labrum.  Separate x-ray report AP lateral cervical spine reason pain  We see anterior osteophytes in the upper and mid cervical spine with degenerative disc disease and loss of normal cervical contours  Impression degenerative spine disease

## 2011-09-11 ENCOUNTER — Other Ambulatory Visit: Payer: Self-pay | Admitting: Orthopedic Surgery

## 2011-09-11 LAB — DIFFERENTIAL
Basophils Absolute: 0
Lymphocytes Relative: 28
Monocytes Absolute: 0.6
Neutro Abs: 3.2

## 2011-09-11 LAB — CBC
Hemoglobin: 15
RBC: 4.83
RDW: 14.1 — ABNORMAL HIGH

## 2011-09-11 LAB — BASIC METABOLIC PANEL
Calcium: 9
GFR calc Af Amer: >60
GFR calc non Af Amer: >60
Glucose, Bld: 103 — ABNORMAL HIGH
Sodium: 142

## 2011-12-10 ENCOUNTER — Other Ambulatory Visit: Payer: Self-pay | Admitting: Orthopedic Surgery

## 2011-12-11 NOTE — Telephone Encounter (Signed)
approved

## 2012-03-11 ENCOUNTER — Other Ambulatory Visit: Payer: Self-pay | Admitting: Orthopedic Surgery

## 2012-03-14 NOTE — Telephone Encounter (Signed)
Approved 90   5 refill

## 2012-03-15 ENCOUNTER — Other Ambulatory Visit: Payer: Self-pay | Admitting: *Deleted

## 2012-03-15 MED ORDER — HYDROCODONE-ACETAMINOPHEN 7.5-325 MG PO TABS: 1.0000 | ORAL_TABLET | ORAL | Status: DC | PRN

## 2012-03-18 NOTE — Telephone Encounter (Signed)
Approved.  

## 2012-05-17 ENCOUNTER — Encounter: Payer: Self-pay | Admitting: Orthopedic Surgery

## 2012-05-17 ENCOUNTER — Ambulatory Visit (INDEPENDENT_AMBULATORY_CARE_PROVIDER_SITE_OTHER): Payer: BC Managed Care – PPO | Admitting: Orthopedic Surgery

## 2012-05-17 VITALS — BP 160/100 | Ht 73.0 in | Wt 264.0 lb

## 2012-05-17 DIAGNOSIS — M75101 Unspecified rotator cuff tear or rupture of right shoulder, not specified as traumatic: Secondary | ICD-10-CM

## 2012-05-17 DIAGNOSIS — M67919 Unspecified disorder of synovium and tendon, unspecified shoulder: Secondary | ICD-10-CM

## 2012-05-17 NOTE — Patient Instructions (Signed)
Shoulder Arthroscopy Because the shoulder is one of the most mobile joints, it is more prone to injury. It is a very shallow ball and socket joint located between the large bone in your upper arm (humerus) and the shoulder blade (scapula). Arthroscopy is a valuable test for evaluating and treating injuries involving the shoulder joint. Arthroscopy is a surgical technique which uses small incisions (cuts by the surgeon) to insert a small telescope like instrument (arthroscope) and other tools into the shoulder. This allows the surgeon to look directly at the problem. When the arthroscope is in the joint, fluid is used to expand the joint space. This allows the surgeon to examine it more easily. The arthroscope then beams light into the joint and sends an image to a TV screen. As your surgeon examines your shoulder, he or she can also repair a number of problems found at the same time. Sometimes the procedure may change to an open surgery. This would happen if the problems are severe enough that they cannot be corrected with just arthroscopy. This is usually a very safe surgery. Rare complications include damage to nerves or blood vessels, excess bleeding, blood clots, infection, and rarely instrument failure. This is most often performed as a same day surgery. This means you will not have to stay in the hospital overnight. Recovery from this surgery is also much faster than having an open procedure. LET YOUR CAREGIVER KNOW ABOUT:  Allergies.   Medications taken including herbs, eye drops, over the counter medications, and creams.   Use of steroids (by mouth or creams).   Previous problems with anesthetics or novocaine.   Possibility of pregnancy, if this applies.   History of blood clots (thrombophlebitis).   History of bleeding or blood problems.   Previous surgery.   Other health problems.   Family history of anesthetic problems.  BEFORE THE PROCEDURE    Stop all anti-inflammatory  medications at least one week before surgery unless instructed otherwise. Tell your surgeon if you have been taking cortisone or other steroids.   Do not eat or drink after midnight or as instructed. Take medications as directed by your caregiver. You may have lab tests the morning of surgery.   You should be present 60 minutes prior to your procedure or as directed.  PROCEDURE   You may have general (go to sleep) or local (numb the area) anesthetic. Your surgeon will discuss this with you. During the procedure as discussed above, your surgeon may find a variety of problems which he or she can improve or correct using small instruments. When the procedure is finished the tiny incisions will be closed with stitches or tape. AFTER YOUR PROCEDURE  After surgery you will be taken to the recovery area. A nurse will watch and check your progress. Once you are awake, stable, and taking fluids well, barring other problems you will be allowed to go home.   Once home, apply an ice pack to your operative site for twenty minutes, three to four times per day, for two to three days. This may help with discomfort and keep the swelling down.   Use a sling and medications if prescribed or as instructed.   Unless your caregiver advises otherwise, move your arm and shoulder gently and frequently following the procedure. This can help prevent stiffness and swelling.  REHABILITATION  Almost as important as your surgery is your rehabilitation. If physical therapy and exercises are prescribed by your surgeon, follow them diligently. Once comfortable and on  your way to full use, do muscle strengthening exercises as instructed.   Only take over-the-counter or prescription medicines for pain, discomfort, or fever as directed by your caregiver.  SEEK IMMEDIATE MEDICAL CARE IF:    There is redness, swelling, or increasing pain in the wound or joint.   You notice purulent (colored- pus-like) drainage coming from the  wound.   An unexplained oral temperature above 102 F (38.9 C) develops.   You notice a foul smell coming from the wound or dressing.   There is a breaking open of the wound. The edges do not stay together after sutures or tape has been removed.   Persistent bleeding from the small incision.  Document Released: 11/07/2000 Document Revised: 10/30/2011 Document Reviewed: 02/26/2009 [facility] Patient Information 2012 [place], [place].  You have been scheduled for surgery.  All surgeries carry some risk.  Remember you always have the option of continued nonsurgical treatment. However in this situation the risks vs. the benefits favor surgery as the best treatment option. The risks of the surgery includes the following but is not limited to bleeding, infection, pulmonary embolus, death from anesthesia, nerve injury vascular injury or need for further surgery, continued pain.  Specific to this procedure the following risks and complications are rare but possible Stiffness Pain

## 2012-05-18 ENCOUNTER — Telehealth: Payer: Self-pay | Admitting: Orthopedic Surgery

## 2012-05-18 ENCOUNTER — Encounter: Payer: Self-pay | Admitting: Orthopedic Surgery

## 2012-05-18 ENCOUNTER — Encounter (HOSPITAL_COMMUNITY): Payer: Self-pay | Admitting: Pharmacy Technician

## 2012-05-18 NOTE — Telephone Encounter (Signed)
Kim from Day Surgery called to relay times as follows: Pre-op appointment scheduled tomorrow, 05/19/17, 8:00am Surgery slot is scheduled Friday, 05/21/12, 7:30am

## 2012-05-18 NOTE — Patient Instructions (Addendum)
20 MARQUIS DOWN  05/18/2012   Your procedure is scheduled on:  05/21/2012  Report to [facility] at  630  AM.  Call this number if you have problems the morning of surgery: [phone]   Remember:   Do not eat food:After Midnight.  May have clear liquids:until Midnight .   Take these medicines the morning of surgery with A SIP OF WATER: flexaril,hydrchlorothiazide,norco,lisinopril,metoprolol   Do not wear jewelry, make-up or nail polish.  Do not wear lotions, powders, or perfumes. You may wear deodorant.  Do not shave 48 hours prior to surgery. Men may shave face and neck.  Do not bring valuables to the hospital.  Contacts, dentures or bridgework may not be worn into surgery.  Leave suitcase in the car. After surgery it may be brought to your room.  For patients admitted to the hospital, checkout time is 11:00 AM the day of discharge.   Patients discharged the day of surgery will not be allowed to drive home.  Name and phone number of your driver: family  Special Instructions: CHG Shower Use Special Wash: 1/2 bottle night before surgery and 1/2 bottle morning of surgery.   Please read over the following fact sheets that you were given: Pain Booklet, MRSA Information, Surgical Site Infection Prevention, Anesthesia Post-op Instructions and Care and Recovery After Surgery Shoulder Arthroscopy Because the shoulder is one of the most mobile joints, it is more prone to injury. It is a very shallow ball and socket joint located between the large bone in your upper arm (humerus) and the shoulder blade (scapula). Arthroscopy is a valuable test for evaluating and treating injuries involving the shoulder joint. Arthroscopy is a surgical technique which uses small incisions (cuts by the surgeon) to insert a small telescope like instrument (arthroscope) and other tools into the shoulder. This allows the surgeon to look directly at the problem. When the arthroscope is in the joint, fluid is used to  expand the joint space. This allows the surgeon to examine it more easily. The arthroscope then beams light into the joint and sends an image to a TV screen. As your surgeon examines your shoulder, he or she can also repair a number of problems found at the same time. Sometimes the procedure may change to an open surgery. This would happen if the problems are severe enough that they cannot be corrected with just arthroscopy. This is usually a very safe surgery. Rare complications include damage to nerves or blood vessels, excess bleeding, blood clots, infection, and rarely instrument failure. This is most often performed as a same day surgery. This means you will not have to stay in the hospital overnight. Recovery from this surgery is also much faster than having an open procedure. LET YOUR CAREGIVER KNOW ABOUT:  Allergies.   Medications taken including herbs, eye drops, over the counter medications, and creams.   Use of steroids (by mouth or creams).   Previous problems with anesthetics or novocaine.   Possibility of pregnancy, if this applies.   History of blood clots (thrombophlebitis).   History of bleeding or blood problems.   Previous surgery.   Other health problems.   Family history of anesthetic problems.  BEFORE THE PROCEDURE   Stop all anti-inflammatory medications at least one week before surgery unless instructed otherwise. Tell your surgeon if you have been taking cortisone or other steroids.   Do not eat or drink after midnight or as instructed. Take medications as directed by your caregiver. You  may have lab tests the morning of surgery.   You should be present 60 minutes prior to your procedure or as directed.  PROCEDURE  You may have general (go to sleep) or local (numb the area) anesthetic. Your surgeon will discuss this with you. During the procedure as discussed above, your surgeon may find a variety of problems which he or she can improve or correct using small  instruments. When the procedure is finished the tiny incisions will be closed with stitches or tape. AFTER YOUR PROCEDURE  After surgery you will be taken to the recovery area. A nurse will watch and check your progress. Once you are awake, stable, and taking fluids well, barring other problems you will be allowed to go home.   Once home, apply an ice pack to your operative site for twenty minutes, three to four times per day, for two to three days. This may help with discomfort and keep the swelling down.   Use a sling and medications if prescribed or as instructed.   Unless your caregiver advises otherwise, move your arm and shoulder gently and frequently following the procedure. This can help prevent stiffness and swelling.  REHABILITATION  Almost as important as your surgery is your rehabilitation. If physical therapy and exercises are prescribed by your surgeon, follow them diligently. Once comfortable and on your way to full use, do muscle strengthening exercises as instructed.   Only take over-the-counter or prescription medicines for pain, discomfort, or fever as directed by your caregiver.  SEEK IMMEDIATE MEDICAL CARE IF:   There is redness, swelling, or increasing pain in the wound or joint.   You notice purulent (colored- pus-like) drainage coming from the wound.   An unexplained oral temperature above 102 F (38.9 C) develops.   You notice a foul smell coming from the wound or dressing.   There is a breaking open of the wound. The edges do not stay together after sutures or tape has been removed.   Persistent bleeding from the small incision.  Document Released: 11/07/2000 Document Revised: 10/30/2011 Document Reviewed: 02/26/2009 [facility] Patient Information 2012 [place], [place].PATIENT INSTRUCTIONS POST-ANESTHESIA  IMMEDIATELY FOLLOWING SURGERY:  Do not drive or operate machinery for the first twenty four hours after surgery.  Do not make any important decisions for  twenty four hours after surgery or while taking narcotic pain medications or sedatives.  If you develop intractable nausea and vomiting or a severe headache please notify your doctor immediately.  FOLLOW-UP:  Please make an appointment with your surgeon as instructed. You do not need to follow up with anesthesia unless specifically instructed to do so.  WOUND CARE INSTRUCTIONS (if applicable):  Keep a dry clean dressing on the anesthesia/puncture wound site if there is drainage.  Once the wound has quit draining you may leave it open to air.  Generally you should leave the bandage intact for twenty four hours unless there is drainage.  If the epidural site drains for more than 36-48 hours please call the anesthesia department.  QUESTIONS?:  Please feel free to call your physician or the hospital operator if you have any questions, and they will be happy to assist you.

## 2012-05-18 NOTE — Telephone Encounter (Signed)
Patient aware.

## 2012-05-18 NOTE — Progress Notes (Signed)
  Subjective:    Patient ID: [id number] Jackson, male    DOB: 1966/02/02, 46 y.o.   MRN: [id number] Chief Complaint  Patient presents with  . Follow-up    follow up right shoulder    BP 160/100  Ht 6\' 1"  (1.854 m)  Wt 264 lb (119.75 kg)  BMI 34.83 kg/m2  Continued problems with RIGHT shoulder, status post previous arthroscopy.  Patient has pain when he is shooting basketball or lifting weights. It seems to come and go he does not have any night symptoms. He is having a lot of popping and clicking in the shoulder with forward elevation.  He is eager to have this addressed surgically.  We will plan for RIGHT shoulder arthroscopy and decompression. HPI    Review of Systems     Objective:   Physical Exam  Otherwise normal exam with tenderness over the supraspinatus tendon anteriorly and painful forward elevation 120 with a positive impingement sign, but normal. 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 Possible partial tear of the rotator cuff.  Recommended compression of the shoulder and cleanout of the subacromial space, possible cuff repair, if needed  H/p to be done later and is encorporated by ref   Convert a preop visit

## 2012-05-19 ENCOUNTER — Encounter (HOSPITAL_COMMUNITY): Payer: Self-pay

## 2012-05-19 ENCOUNTER — Encounter (HOSPITAL_COMMUNITY)
Admission: RE | Admit: 2012-05-19 | Discharge: 2012-05-19 | Disposition: A | Discharge status: Home / Self Care | Payer: BC Managed Care – PPO | Source: Ambulatory Visit | Attending: Orthopedic Surgery | Admitting: Orthopedic Surgery

## 2012-05-19 ENCOUNTER — Telehealth: Payer: Self-pay | Admitting: Orthopedic Surgery

## 2012-05-19 LAB — BASIC METABOLIC PANEL
BUN: 16 mg/dL (ref 6–23)
CO2: 29 mEq/L (ref 19–32)
Calcium: 9.8 mg/dL (ref 8.4–10.5)
Chloride: 105 mEq/L (ref 96–112)
Creatinine, Ser: 1.15 mg/dL (ref 0.50–1.35)
Glucose, Bld: 151 mg/dL — ABNORMAL HIGH (ref 70–99)

## 2012-05-19 LAB — SURGICAL PCR SCREEN: Staphylococcus aureus: NEGATIVE

## 2012-05-19 NOTE — Telephone Encounter (Signed)
Contacted insurer, [employer], ph# [phone], regarding out-patient surgery scheduled for 05/19/12 at [facility], [place] [address].  Per Shary Decamp, in pre-authorization department, no pre-authorization is required; her name and today's date for reference.

## 2012-05-19 NOTE — Progress Notes (Signed)
05/19/12 0901  OBSTRUCTIVE SLEEP APNEA  Have you ever been diagnosed with sleep apnea through a sleep study? No  Do you snore loudly (loud enough to be heard through closed doors)?  0  Do you often feel tired, fatigued, or sleepy during the daytime? 0  Has anyone observed you stop breathing during your sleep? 0  Do you have, or are you being treated for high blood pressure? 1  BMI more than 35 kg/m2? 1  Age over 46 years old? 0  Neck circumference greater than 40 cm/18 inches? 1  Gender: 1  Obstructive Sleep Apnea Score 4   Score 4 or greater  Updated health history;Results sent to PCP

## 2012-05-20 NOTE — H&P (Signed)
Geoffrey Jackson is an 46 y.o. male.   Chief Complaint: RIGHT shoulder pain with popping HPI:  Geoffrey Jackson is a 46 year old male, who had arthroscopy of the RIGHT shoulder, debridement of disease, rotator cuff tendon, acromioplasty, with decompression and revision of biceps tendon, July 2011. He Initially improved and had a fairly significant period of pain-free time in his RIGHT shoulder, but in the last year or so. The pain has returned. He has difficulty throwing and shooting a basketball. He has popping and crepitation in the subacromial space and he did not respond to further physical therapy and subacromial injection or hydrocodone.  He is unhappy with the function of his shoulder at this time. I would like further treatment surgically to try to correct the pain and the crepitation. He also has some weakness in the RIGHT shoulder, which is dynamic and not felt on physical exam.    Past Medical History  Diagnosis Date  . GERD (gastroesophageal reflux disease)   . Hyperlipidemia   . Hypertension   . Low back pain   . Benign prostatic hypertrophy     Past Surgical History  Procedure Date  . A spinal surgery   . Shoulder arthroscopy     No family history on file. Social History:  reports that he has never smoked. He does not have any smokeless tobacco history on file. His alcohol and drug histories not on file.  Allergies:  Allergies  Allergen Reactions  . Demerol (Meperidine) Itching  . Lansoprazole Other (See Comments)    unknown    No prescriptions prior to admission    Results for orders placed during the hospital encounter of 05/19/12 (from the past 48 hour(s))  SURGICAL PCR SCREEN     Status: Normal   Collection Time   05/19/12  7:58 AM      Component Value Range Comment   MRSA, PCR NEGATIVE  NEGATIVE    Staphylococcus aureus NEGATIVE  NEGATIVE   BASIC METABOLIC PANEL     Status: Abnormal   Collection Time   05/19/12  8:30 AM      Component Value Range Comment     Sodium 143  135 - 145 mEq/L    Potassium 3.8  3.5 - 5.1 mEq/L    Chloride 105  96 - 112 mEq/L    CO2 29  19 - 32 mEq/L    Glucose, Bld 151 (*) 70 - 99 mg/dL    BUN 16  6 - 23 mg/dL    Creatinine, Ser 5.62  0.50 - 1.35 mg/dL    Calcium 9.8  8.4 - 13.0 mg/dL    GFR calc non Af Amer 75 (*) >90 mL/min    GFR calc Af Amer 87 (*) >90 mL/min   HEMOGLOBIN AND HEMATOCRIT, BLOOD     Status: Normal   Collection Time   05/19/12  8:30 AM      Component Value Range Comment   Hemoglobin 15.2  13.0 - 17.0 g/dL    HCT 86.5  78.4 - 69.6 %    No results found.  Review of Systems  HENT: Positive for neck pain.   Musculoskeletal: Positive for myalgias and joint pain.  All other systems reviewed and are negative.    There were no vitals taken for this visit. Physical Exam  Constitutional: He is oriented to person, place, and time. He appears well-developed and well-nourished.  HENT:  Head: Normocephalic.  Neck: Neck supple.  Cardiovascular: Normal rate.   Respiratory: Effort normal.  Musculoskeletal:       Right shoulder: He exhibits decreased range of motion, tenderness, crepitus, pain and decreased strength. He exhibits no swelling, no effusion, no deformity, no laceration, no spasm and normal pulse.       Left shoulder: Normal.       Right elbow: Normal.      Left elbow: Normal.       Right wrist: Normal.       Left wrist: Normal.       Left upper arm: Normal.       Left forearm: Normal.       Left hand: Normal.       Pain is noted in abduction, external rotation, and forward elevation. He has lost about 25 of forward elevation actively although passively. His range of motion is full. He has a painful arc of motion with crepitation from 90 up to 180.  He has a positive impingement sign as well by Geoffrey Jackson and Neer criteria.  Lymphadenopathy:    He has no cervical adenopathy.       Right: No supraclavicular adenopathy present.       Left: No supraclavicular adenopathy present.   Neurological: He is alert and oriented to person, place, and time. He has normal reflexes.  Skin: Skin is warm and dry.  Psychiatric: He has a normal mood and affect. His behavior is normal. Judgment and thought content normal.     Assessment/Plan Postoperative imaging showed that he had an adequate decompression from a bone standpoint.  It is apparent that he developed scarring in the subacromial space and may have actually torn. The diseased rotator cuff. This will be evaluated arthroscopically above and below the tendon and repairing will be performed as needed.  He will need a resect them. He will evaluate the biceps tendon for possible tenodesis.  Plan arthroscopy RIGHT shoulder decompression. Possible biceps tenodesis. Evaluation rotator cuff.  Geoffrey Jackson 05/20/2012, 10:45 AM

## 2012-05-21 ENCOUNTER — Encounter (HOSPITAL_COMMUNITY): Payer: Self-pay | Admitting: Anesthesiology

## 2012-05-21 ENCOUNTER — Ambulatory Visit (HOSPITAL_COMMUNITY): Payer: BC Managed Care – PPO | Admitting: Anesthesiology

## 2012-05-21 ENCOUNTER — Encounter (HOSPITAL_COMMUNITY)
Admission: RE | Disposition: A | Discharge status: Home / Self Care | Payer: Self-pay | Source: Ambulatory Visit | Attending: Orthopedic Surgery

## 2012-05-21 ENCOUNTER — Ambulatory Visit (HOSPITAL_COMMUNITY)
Admission: RE | Admit: 2012-05-21 | Discharge: 2012-05-21 | Disposition: A | Discharge status: Home / Self Care | Payer: BC Managed Care – PPO | Source: Ambulatory Visit | Attending: Orthopedic Surgery | Admitting: Orthopedic Surgery

## 2012-05-21 ENCOUNTER — Encounter (HOSPITAL_COMMUNITY): Payer: Self-pay | Admitting: *Deleted

## 2012-05-21 DIAGNOSIS — E119 Type 2 diabetes mellitus without complications: Secondary | ICD-10-CM | POA: Insufficient documentation

## 2012-05-21 DIAGNOSIS — E785 Hyperlipidemia, unspecified: Secondary | ICD-10-CM | POA: Insufficient documentation

## 2012-05-21 DIAGNOSIS — M66329 Spontaneous rupture of flexor tendons, unspecified upper arm: Secondary | ICD-10-CM | POA: Diagnosis present

## 2012-05-21 DIAGNOSIS — M25519 Pain in unspecified shoulder: Secondary | ICD-10-CM

## 2012-05-21 DIAGNOSIS — Z01812 Encounter for preprocedural laboratory examination: Secondary | ICD-10-CM | POA: Insufficient documentation

## 2012-05-21 DIAGNOSIS — I10 Essential (primary) hypertension: Secondary | ICD-10-CM | POA: Insufficient documentation

## 2012-05-21 DIAGNOSIS — M6790 Unspecified disorder of synovium and tendon, unspecified site: Secondary | ICD-10-CM | POA: Insufficient documentation

## 2012-05-21 DIAGNOSIS — M249 Joint derangement, unspecified: Secondary | ICD-10-CM | POA: Insufficient documentation

## 2012-05-21 DIAGNOSIS — M719 Bursopathy, unspecified: Secondary | ICD-10-CM | POA: Insufficient documentation

## 2012-05-21 DIAGNOSIS — Z79899 Other long term (current) drug therapy: Secondary | ICD-10-CM | POA: Insufficient documentation

## 2012-05-21 DIAGNOSIS — M67919 Unspecified disorder of synovium and tendon, unspecified shoulder: Secondary | ICD-10-CM

## 2012-05-21 DIAGNOSIS — IMO0002 Reserved for concepts with insufficient information to code with codable children: Secondary | ICD-10-CM

## 2012-05-21 DIAGNOSIS — G8929 Other chronic pain: Secondary | ICD-10-CM

## 2012-05-21 DIAGNOSIS — Z01810 Encounter for preprocedural cardiovascular examination: Secondary | ICD-10-CM | POA: Insufficient documentation

## 2012-05-21 HISTORY — PX: SHOULDER ARTHROSCOPY: SHX128

## 2012-05-21 HISTORY — PX: SUBACROMIAL DECOMPRESSION: SHX5174

## 2012-05-21 LAB — GLUCOSE, CAPILLARY: Glucose-Capillary: 170 mg/dL — ABNORMAL HIGH (ref 70–99)

## 2012-05-21 SURGERY — ARTHROSCOPY, SHOULDER
Anesthesia: General | Site: Shoulder | Laterality: Right | Wound class: Clean

## 2012-05-21 MED ORDER — ROCURONIUM BROMIDE 100 MG/10ML IV SOLN
INTRAVENOUS | Status: DC | PRN
  Administered 2012-05-21: 5 mg via INTRAVENOUS
  Administered 2012-05-21: 45 mg via INTRAVENOUS
  Administered 2012-05-21: 10 mg via INTRAVENOUS

## 2012-05-21 MED ORDER — PROPOFOL 10 MG/ML IV EMUL
INTRAVENOUS | Status: DC | PRN
  Administered 2012-05-21: 20 mg via INTRAVENOUS
  Administered 2012-05-21: 150 mg via INTRAVENOUS

## 2012-05-21 MED ORDER — CEFAZOLIN SODIUM-DEXTROSE 2-3 GM-% IV SOLR
2.0000 g | INTRAVENOUS | Status: AC
  Administered 2012-05-21: 2 g via INTRAVENOUS

## 2012-05-21 MED ORDER — ONDANSETRON HCL 4 MG/2ML IJ SOLN: 4.0000 mg | Freq: Once | INTRAMUSCULAR | Status: DC

## 2012-05-21 MED ORDER — SUCCINYLCHOLINE CHLORIDE 20 MG/ML IJ SOLN
INTRAMUSCULAR | Status: DC | PRN
  Administered 2012-05-21: 140 mg via INTRAVENOUS

## 2012-05-21 MED ORDER — LIDOCAINE HCL (CARDIAC) 10 MG/ML IV SOLN
INTRAVENOUS | Status: DC | PRN
  Administered 2012-05-21: 20 mg via INTRAVENOUS

## 2012-05-21 MED ORDER — CELECOXIB 100 MG PO CAPS
ORAL_CAPSULE | ORAL | Status: AC
  Administered 2012-05-21: 400 mg via ORAL
  Filled 2012-05-21: qty 4

## 2012-05-21 MED ORDER — ONDANSETRON HCL 4 MG/2ML IJ SOLN
INTRAMUSCULAR | Status: AC
  Administered 2012-05-21: 4 mg via INTRAVENOUS
  Filled 2012-05-21: qty 2

## 2012-05-21 MED ORDER — GLYCOPYRROLATE 0.2 MG/ML IJ SOLN
INTRAMUSCULAR | Status: AC
  Administered 2012-05-21: 0.2 mg via INTRAVENOUS
  Filled 2012-05-21: qty 1

## 2012-05-21 MED ORDER — GLYCOPYRROLATE 0.2 MG/ML IJ SOLN
0.2000 mg | Freq: Once | INTRAMUSCULAR | Status: AC
  Administered 2012-05-21: 0.2 mg via INTRAVENOUS

## 2012-05-21 MED ORDER — EPHEDRINE SULFATE 50 MG/ML IJ SOLN
INTRAMUSCULAR | Status: DC | PRN
  Administered 2012-05-21: 10 mg via INTRAVENOUS
  Administered 2012-05-21 (×8): 5 mg via INTRAVENOUS

## 2012-05-21 MED ORDER — LACTATED RINGERS IV SOLN
INTRAVENOUS | Status: DC
  Administered 2012-05-21: 10:00:00 via INTRAVENOUS
  Administered 2012-05-21: 1000 mL via INTRAVENOUS

## 2012-05-21 MED ORDER — EPINEPHRINE HCL 1 MG/ML IJ SOLN
INTRAMUSCULAR | Status: AC
  Filled 2012-05-21: qty 5

## 2012-05-21 MED ORDER — EPHEDRINE SULFATE 50 MG/ML IJ SOLN
INTRAMUSCULAR | Status: AC
  Filled 2012-05-21: qty 1

## 2012-05-21 MED ORDER — FENTANYL CITRATE 0.05 MG/ML IJ SOLN
25.0000 ug | INTRAMUSCULAR | Status: DC | PRN
  Administered 2012-05-21 (×4): 50 ug via INTRAVENOUS

## 2012-05-21 MED ORDER — LIDOCAINE HCL (PF) 1 % IJ SOLN
INTRAMUSCULAR | Status: AC
  Filled 2012-05-21: qty 5

## 2012-05-21 MED ORDER — IBUPROFEN 800 MG PO TABS: 800.0000 mg | ORAL_TABLET | Freq: Three times a day (TID) | ORAL | Status: DC | PRN

## 2012-05-21 MED ORDER — ACETAMINOPHEN 10 MG/ML IV SOLN
1000.0000 mg | Freq: Once | INTRAVENOUS | Status: AC
  Administered 2012-05-21: 1000 mg via INTRAVENOUS

## 2012-05-21 MED ORDER — FENTANYL CITRATE 0.05 MG/ML IJ SOLN
INTRAMUSCULAR | Status: AC
  Administered 2012-05-21: 50 ug via INTRAVENOUS
  Filled 2012-05-21: qty 2

## 2012-05-21 MED ORDER — EPINEPHRINE HCL 1 MG/ML IJ SOLN
INTRAMUSCULAR | Status: AC
  Filled 2012-05-21: qty 4

## 2012-05-21 MED ORDER — SODIUM CHLORIDE 0.9 % IR SOLN
Status: DC | PRN
  Administered 2012-05-21: 1000 mL

## 2012-05-21 MED ORDER — GLYCOPYRROLATE 0.2 MG/ML IJ SOLN
INTRAMUSCULAR | Status: DC | PRN
  Administered 2012-05-21: 0.2 mg via INTRAVENOUS
  Administered 2012-05-21: 0.4 mg via INTRAVENOUS

## 2012-05-21 MED ORDER — CHLORHEXIDINE GLUCONATE 4 % EX LIQD
60.0000 mL | Freq: Once | CUTANEOUS | Status: DC
  Filled 2012-05-21: qty 60

## 2012-05-21 MED ORDER — FENTANYL CITRATE 0.05 MG/ML IJ SOLN
INTRAMUSCULAR | Status: AC
  Filled 2012-05-21: qty 2

## 2012-05-21 MED ORDER — ONDANSETRON HCL 4 MG/2ML IJ SOLN
4.0000 mg | Freq: Once | INTRAMUSCULAR | Status: AC
  Administered 2012-05-21: 4 mg via INTRAVENOUS

## 2012-05-21 MED ORDER — SUCCINYLCHOLINE CHLORIDE 20 MG/ML IJ SOLN
INTRAMUSCULAR | Status: AC
  Filled 2012-05-21: qty 1

## 2012-05-21 MED ORDER — GLYCOPYRROLATE 0.2 MG/ML IJ SOLN
INTRAMUSCULAR | Status: AC
  Filled 2012-05-21: qty 1

## 2012-05-21 MED ORDER — METHOCARBAMOL 500 MG PO TABS: 500.0000 mg | ORAL_TABLET | Freq: Four times a day (QID) | ORAL | Status: DC

## 2012-05-21 MED ORDER — OXYCODONE-ACETAMINOPHEN 5-325 MG PO TABS: 1.0000 | ORAL_TABLET | ORAL | Status: DC | PRN

## 2012-05-21 MED ORDER — PREGABALIN 50 MG PO CAPS
ORAL_CAPSULE | ORAL | Status: AC
  Administered 2012-05-21: 50 mg via ORAL
  Filled 2012-05-21: qty 1

## 2012-05-21 MED ORDER — NEOSTIGMINE METHYLSULFATE 1 MG/ML IJ SOLN
INTRAMUSCULAR | Status: DC | PRN
  Administered 2012-05-21: 2 mg via INTRAVENOUS

## 2012-05-21 MED ORDER — BUPIVACAINE-EPINEPHRINE PF 0.5-1:200000 % IJ SOLN
INTRAMUSCULAR | Status: AC
  Filled 2012-05-21: qty 20

## 2012-05-21 MED ORDER — ACETAMINOPHEN 10 MG/ML IV SOLN
INTRAVENOUS | Status: AC
  Administered 2012-05-21: 1000 mg via INTRAVENOUS
  Filled 2012-05-21: qty 100

## 2012-05-21 MED ORDER — PREGABALIN 50 MG PO CAPS
50.0000 mg | ORAL_CAPSULE | Freq: Once | ORAL | Status: AC
  Administered 2012-05-21: 50 mg via ORAL

## 2012-05-21 MED ORDER — ACETAMINOPHEN 325 MG PO TABS
325.0000 mg | ORAL_TABLET | ORAL | Status: DC | PRN
Start: 2012-05-21 — End: 2012-05-21

## 2012-05-21 MED ORDER — OXYCODONE HCL 5 MG PO TABS
5.0000 mg | ORAL_TABLET | Freq: Once | ORAL | Status: AC
  Administered 2012-05-21: 5 mg via ORAL

## 2012-05-21 MED ORDER — MIDAZOLAM HCL 2 MG/2ML IJ SOLN
INTRAMUSCULAR | Status: AC
  Administered 2012-05-21: 2 mg via INTRAVENOUS
  Filled 2012-05-21: qty 2

## 2012-05-21 MED ORDER — MIDAZOLAM HCL 2 MG/2ML IJ SOLN
1.0000 mg | INTRAMUSCULAR | Status: DC | PRN
  Administered 2012-05-21 (×2): 2 mg via INTRAVENOUS

## 2012-05-21 MED ORDER — ONDANSETRON HCL 4 MG/2ML IJ SOLN
INTRAMUSCULAR | Status: AC
  Filled 2012-05-21: qty 2

## 2012-05-21 MED ORDER — ROCURONIUM BROMIDE 50 MG/5ML IV SOLN
INTRAVENOUS | Status: AC
  Filled 2012-05-21: qty 1

## 2012-05-21 MED ORDER — NEOSTIGMINE METHYLSULFATE 1 MG/ML IJ SOLN
INTRAMUSCULAR | Status: AC
  Filled 2012-05-21: qty 10

## 2012-05-21 MED ORDER — ONDANSETRON HCL 4 MG/2ML IJ SOLN: 4.0000 mg | Freq: Once | INTRAMUSCULAR | Status: DC | PRN

## 2012-05-21 MED ORDER — OXYCODONE HCL 5 MG PO TABS
ORAL_TABLET | ORAL | Status: AC
  Filled 2012-05-21: qty 1

## 2012-05-21 MED ORDER — METHOCARBAMOL 100 MG/ML IJ SOLN
500.0000 mg | Freq: Once | INTRAVENOUS | Status: AC
  Administered 2012-05-21: 500 mg via INTRAVENOUS
  Filled 2012-05-21: qty 5

## 2012-05-21 MED ORDER — BUPIVACAINE-EPINEPHRINE 0.5% -1:200000 IJ SOLN
INTRAMUSCULAR | Status: DC | PRN
  Administered 2012-05-21: 60 mL

## 2012-05-21 MED ORDER — FENTANYL CITRATE 0.05 MG/ML IJ SOLN
INTRAMUSCULAR | Status: DC | PRN
  Administered 2012-05-21 (×4): 25 ug via INTRAVENOUS

## 2012-05-21 MED ORDER — CEFAZOLIN SODIUM-DEXTROSE 2-3 GM-% IV SOLR
INTRAVENOUS | Status: AC
  Filled 2012-05-21: qty 50

## 2012-05-21 MED ORDER — SODIUM CHLORIDE 0.9 % IR SOLN
Status: DC | PRN
  Administered 2012-05-21 (×12)

## 2012-05-21 MED ORDER — PROPOFOL 10 MG/ML IV EMUL
INTRAVENOUS | Status: AC
  Filled 2012-05-21: qty 20

## 2012-05-21 MED ORDER — CELECOXIB 100 MG PO CAPS
400.0000 mg | ORAL_CAPSULE | Freq: Once | ORAL | Status: AC
  Administered 2012-05-21: 400 mg via ORAL

## 2012-05-21 SURGICAL SUPPLY — 95 items
BAG HAMPER (MISCELLANEOUS) ×2 IMPLANT
BLADE AGGRESSIVE PLUS 4.0 (BLADE) ×2 IMPLANT
BLADE AVERAGE 25X9 (BLADE) IMPLANT
BLADE SURG 15 STRL LF DISP TIS (BLADE) ×1 IMPLANT
BLADE SURG 15 STRL SS (BLADE) ×1
BLADE SURG SZ10 CARB STEEL (BLADE) ×2 IMPLANT
BLADE SURG SZ11 CARB STEEL (BLADE) ×2 IMPLANT
BNDG COHESIVE 4X5 TAN STRL (GAUZE/BANDAGES/DRESSINGS) ×2 IMPLANT
BUR 5.0 BARRELL (BURR) IMPLANT
BUR BARRELL 4.0 (BURR) ×2 IMPLANT
BUR ROUND 5.0 (BURR) IMPLANT
CANNULA DRILOCK 5.0X75 (CANNULA) ×4 IMPLANT
CANNULA DRILOCK 6.5X75 (CANNULA) IMPLANT
CANNULA DRILOCK 8.0X75 (CANNULA) IMPLANT
CATH KIT ON Q 2.5IN SLV (PAIN MANAGEMENT) IMPLANT
CHLORAPREP W/TINT 26ML (MISCELLANEOUS) ×4 IMPLANT
CLOTH BEACON ORANGE TIMEOUT ST (SAFETY) ×2 IMPLANT
COOLER CRYO CUFF IC AND MOTOR (MISCELLANEOUS) IMPLANT
COVER LIGHT HANDLE STERIS (MISCELLANEOUS) ×4 IMPLANT
COVER PROBE W GEL 5X96 (DRAPES) ×2 IMPLANT
CUFF CRYO UNI SHDR 32X48 (MISCELLANEOUS) IMPLANT
DECANTER SPIKE VIAL GLASS SM (MISCELLANEOUS) ×4 IMPLANT
DRAPE PROXIMA HALF (DRAPES) ×2 IMPLANT
DRAPE SHOULDER BEACH CHAIR (DRAPES) ×2 IMPLANT
DRAPE U-SHAPE 47X51 STRL (DRAPES) ×2 IMPLANT
DRESSING ALLEVYN BORDER HEEL (GAUZE/BANDAGES/DRESSINGS) IMPLANT
ELECT REM PT RETURN 9FT ADLT (ELECTROSURGICAL) ×2
ELECTRODE REM PT RTRN 9FT ADLT (ELECTROSURGICAL) ×1 IMPLANT
FACESHIELD STD STERILE (MASK) IMPLANT
FIBERSTICK 2 (SUTURE) IMPLANT
FLOOR PAD 36X40 (MISCELLANEOUS) ×4
GAUZE SPONGE 4X4 16PLY XRAY LF (GAUZE/BANDAGES/DRESSINGS) ×2 IMPLANT
GAUZE XEROFORM 5X9 LF (GAUZE/BANDAGES/DRESSINGS) IMPLANT
GLOVE BIOGEL PI IND STRL 7.0 (GLOVE) ×3 IMPLANT
GLOVE BIOGEL PI INDICATOR 7.0 (GLOVE) ×3
GLOVE ECLIPSE 6.5 STRL STRAW (GLOVE) ×4 IMPLANT
GLOVE SKINSENSE NS SZ8.0 LF (GLOVE) ×1
GLOVE SKINSENSE STRL SZ8.0 LF (GLOVE) ×1 IMPLANT
GLOVE SS N UNI LF 8.5 STRL (GLOVE) ×2 IMPLANT
GOWN STRL REIN XL XLG (GOWN DISPOSABLE) ×6 IMPLANT
INST SET MINOR BONE (KITS) ×2 IMPLANT
IV NS IRRIG 3000ML ARTHROMATIC (IV SOLUTION) ×24 IMPLANT
KIT BLADEGUARD II DBL (SET/KITS/TRAYS/PACK) ×2 IMPLANT
KIT POSITION SHOULDER SCHLEI (MISCELLANEOUS) ×2 IMPLANT
KIT ROOM TURNOVER APOR (KITS) ×2 IMPLANT
MANIFOLD NEPTUNE II (INSTRUMENTS) ×2 IMPLANT
MARKER SKIN DUAL TIP RULER LAB (MISCELLANEOUS) ×2 IMPLANT
MEPILEX ×4 IMPLANT
MagnumWire ×2 IMPLANT
NEEDLE HYPO 21X1.5 SAFETY (NEEDLE) ×4 IMPLANT
NEEDLE MAYO 6 CRC TAPER PT (NEEDLE) IMPLANT
NEEDLE SCORPION (NEEDLE) IMPLANT
NEEDLE SPNL 18GX3.5 QUINCKE PK (NEEDLE) ×2 IMPLANT
NS IRRIG 1000ML POUR BTL (IV SOLUTION) ×2 IMPLANT
Opus Smartstitch suture cartridge (Wire) ×2 IMPLANT
PACK BASIC III (CUSTOM PROCEDURE TRAY) ×1
PACK SRG BSC III STRL LF ECLPS (CUSTOM PROCEDURE TRAY) ×1 IMPLANT
PAD ABD 5X9 TENDERSORB (GAUZE/BANDAGES/DRESSINGS) IMPLANT
PAD ARMBOARD 7.5X6 YLW CONV (MISCELLANEOUS) ×4 IMPLANT
PAD FLOOR 36X40 (MISCELLANEOUS) ×2 IMPLANT
PASSER SUT CAPTURE FIRST (SUTURE) ×2 IMPLANT
PENCIL HANDSWITCHING (ELECTRODE) ×2 IMPLANT
RASP SM TEAR CROSS CUT (RASP) IMPLANT
SET ARTHROSCOPY INST (INSTRUMENTS) ×2 IMPLANT
SET BASIN LINEN APH (SET/KITS/TRAYS/PACK) ×2 IMPLANT
SLING ARM FOAM STRAP XLG (SOFTGOODS) ×2 IMPLANT
SPONGE GAUZE 4X4 12PLY (GAUZE/BANDAGES/DRESSINGS) IMPLANT
STAPLER VISISTAT 35W (STAPLE) ×2 IMPLANT
STOCKINETTE IMPERVIOUS LG (DRAPES) ×2 IMPLANT
STRIP CLOSURE SKIN 1/2X4 (GAUZE/BANDAGES/DRESSINGS) IMPLANT
SUT BONE WAX W31G (SUTURE) IMPLANT
SUT ETHIBOND NAB OS 4 #2 30IN (SUTURE) IMPLANT
SUT ETHILON 3 0 FSL (SUTURE) ×2 IMPLANT
SUT FIBERWIRE #2 38 REV NDL BL (SUTURE)
SUT FIBERWIRE #2 38 T-5 BLUE (SUTURE)
SUT LASSO 45 DEGREE (SUTURE) ×2 IMPLANT
SUT LASSO 45 DEGREE LEFT (SUTURE) ×2 IMPLANT
SUT LASSO 45D RIGHT (SUTURE) IMPLANT
SUT MNCRL 0 VIOLET CTX 36 (SUTURE) ×1 IMPLANT
SUT MON AB 2-0 CT1 36 (SUTURE) ×2 IMPLANT
SUT MONOCRYL 0 CTX 36 (SUTURE) ×1
SUT PROLENE 2 0 FS (SUTURE) IMPLANT
SUT PROLENE 2 0 SH 30 (SUTURE) IMPLANT
SUT PROLENE 3 0 PS 1 (SUTURE) IMPLANT
SUT VIC AB 1 CT1 27 (SUTURE)
SUT VIC AB 1 CT1 27XBRD ANTBC (SUTURE) IMPLANT
SUTURE FIBERWR #2 38 T-5 BLUE (SUTURE) IMPLANT
SUTURE FIBERWR#2 38 REV NDL BL (SUTURE) IMPLANT
SYR 30ML LL (SYRINGE) ×2 IMPLANT
SYR BULB IRRIGATION 50ML (SYRINGE) IMPLANT
TAPE MEDIFIX FOAM 3 (GAUZE/BANDAGES/DRESSINGS) IMPLANT
TOWEL OR 17X26 4PK STRL BLUE (TOWEL DISPOSABLE) IMPLANT
TUBING ARTHROSCOPY INFLOW/OUT (IRRIGATION / IRRIGATOR) ×4 IMPLANT
WAND 90 DEG TURBOVAC W/CORD (SURGICAL WAND) ×2 IMPLANT
YANKAUER SUCT BULB TIP 10FT TU (MISCELLANEOUS) ×8 IMPLANT

## 2012-05-21 NOTE — Op Note (Addendum)
05/21/2012  10:29 AM  PATIENT:  Geoffrey Jackson  46 y.o. male  PRE-OPERATIVE DIAGNOSIS:  right rotator cuff syndrome  POST-OPERATIVE DIAGNOSIS:  right rotator cuff syndrome, degeneration biceps tendon, partial thickness rotator cuff tear  PROCEDURE:  Procedure(s) (LRB): ARTHROSCOPY SHOULDER (Right) SHOULDER ARTHROSCOPY WITH BICEPS TENDONOTOMY (Right) SUBACROMIAL DECOMPRESSION (Right) DEBRIDEMENT ROTATOR CUFF  FINDINGS:  #1 degeneration and tearing of the biceps tendon #2 degeneration and partial tearing of the rotator cuff musculotendinous junction #3 bursitis and impingement syndrome  Details of procedure  The patient was marked in the preop area right shoulder as a surgical site confirmed and chart review was performed he was taken to the operating room given 2 g of Ancef and general anesthesia  He was placed in the modified beachchair position timeout was completed after prep and drape  Posterior portal was established scope was placed in the joint. Diagnostic arthroscopy was performed.  The biceps tendon was partially torn and degenerated with inflammation this was treated by tenotomy torn anterior portal  The remaining portion of the shoulder was normal with an intact rotator cuff and mild fraying of the infraspinatus.  The scope was placed in the subacromial space a lateral portal was established and a decompression was performed with acromioplasty and bursectomy  The superior portion of the cuff was very degenerated and frayed. At the musculotendinous junction there was a partial-thickness tear.  A second lateral portal was established and an attempt was made to place sutures in the partial-thickness tear. This was unsuccessful and the portal was extended. The sutures would not hold in the muscular portion of the tear and therefore the wound is irrigated and closed with 0 Monocryl to the deltoid split 2-0 Monocryl in the subcutaneous tissue and staples in the  skin  Portals were closed with 3-0 nylon  Subacromial space was injected with 30 cc of Marcaine with epinephrine  Shoulder sling was placed  Patient was extubated and taken to recovery room in stable condition.  Note he did have some issues with his blood pressure which were high initially and then became lower during the procedure this was handled per anesthesia see anesthesia record   SURGEON:  Surgeon(s) and Role:    * [name], MD - Primary  PHYSICIAN ASSISTANT:   ASSISTANTS: BETTY ASHLEY    ANESTHESIA:   general  EBL:  Total I/O In: 1000 [I.V.:1000] Out: 15 [Blood:15]  BLOOD ADMINISTERED:none  DRAINS: none   LOCAL MEDICATIONS USED:  MARCAINE   , Amount: 60 ml and OTHER EPI  SPECIMEN:  No Specimen  DISPOSITION OF SPECIMEN:  N/A  COUNTS:  YES  TOURNIQUET:  * No tourniquets in log *  DICTATION: .Dragon Dictation  PLAN OF CARE: HOME  PATIENT DISPOSITION:  PACU - hemodynamically stable.   Delay start of Pharmacological VTE agent (>24hrs) due to surgical blood loss or risk of bleeding: not applicable

## 2012-05-21 NOTE — Anesthesia Procedure Notes (Signed)
Procedure Name: Intubation Date/Time: 05/21/2012 7:51 AM Performed by: Franco Nones Pre-anesthesia Checklist: Patient identified, Patient being monitored, Timeout performed, Emergency Drugs available and Suction available Patient Re-evaluated:Patient Re-evaluated prior to inductionOxygen Delivery Method: Circle System Utilized Preoxygenation: Pre-oxygenation with 100% oxygen Intubation Type: IV induction, Rapid sequence and Tracheostomy Laryngoscope Size: Hyacinth Meeker and 2 Grade View: Grade II Tube type: Oral Tube size: 7.0 mm Number of attempts: 1 Airway Equipment and Method: stylet Placement Confirmation: ETT inserted through vocal cords under direct vision,  positive ETCO2 and breath sounds checked- equal and bilateral Secured at: 24 cm Tube secured with: Tape Dental Injury: Teeth and Oropharynx as per pre-operative assessment

## 2012-05-21 NOTE — Brief Op Note (Signed)
05/21/2012  10:29 AM  PATIENT:  Geoffrey Jackson  46 y.o. male  PRE-OPERATIVE DIAGNOSIS:  right rotator cuff syndrome  POST-OPERATIVE DIAGNOSIS:  right rotator cuff syndrome, degeneration biceps tendon, partial thickness rotator cuff tear  PROCEDURE:  Procedure(s) (LRB): ARTHROSCOPY SHOULDER (Right) SHOULDER ARTHROSCOPY WITH BICEPS TENDONOTOMY (Right) SUBACROMIAL DECOMPRESSION (Right) DEBRIDEMENT ROTATOR CUFF  FINDINGS:  #1 degeneration and tearing of the biceps tendon #2 degeneration and partial tearing of the rotator cuff musculotendinous junction #3 bursitis and impingement syndrome  Details of procedure  The patient was marked in the preop area right shoulder as a surgical site confirmed and chart review was performed he was taken to the operating room given 2 g of Ancef and general anesthesia  He was placed in the modified beachchair position timeout was completed after prep and drape  Posterior portal was established scope was placed in the joint. Diagnostic arthroscopy was performed.  The biceps tendon was partially torn and degenerated with inflammation this was treated by tenotomy torn anterior portal  The remaining portion of the shoulder was normal with an intact rotator cuff and mild fraying of the infraspinatus.  The scope was placed in the subacromial space a lateral portal was established and a decompression was performed with acromioplasty and bursectomy  The superior portion of the cuff was very degenerated and frayed. At the musculotendinous junction there was a partial-thickness tear.  A second lateral portal was established and an attempt was made to place sutures in the partial-thickness tear. This was unsuccessful and the portal was extended. The sutures would not hold in the muscular portion of the tear and therefore the wound is irrigated and closed with 0 Monocryl to the deltoid split 2-0 Monocryl in the subcutaneous tissue and staples in the  skin  Portals were closed with 3-0 nylon  Subacromial space was injected with 30 cc of Marcaine with epinephrine  Shoulder sling was placed  Patient was extubated and taken to recovery room in stable condition.  Note he did have some issues with his blood pressure which were high initially and then became lower during the procedure this was handled per anesthesia see anesthesia record   SURGEON:  Surgeon(s) and Role:    * Mandie Crabbe E Nassir Neidert, MD - Primary  PHYSICIAN ASSISTANT:   ASSISTANTS: BETTY ASHLEY    ANESTHESIA:   general  EBL:  Total I/O In: 1000 [I.V.:1000] Out: 15 [Blood:15]  BLOOD ADMINISTERED:none  DRAINS: none   LOCAL MEDICATIONS USED:  MARCAINE   , Amount: 60 ml and OTHER EPI  SPECIMEN:  No Specimen  DISPOSITION OF SPECIMEN:  N/A  COUNTS:  YES  TOURNIQUET:  * No tourniquets in log *  DICTATION: .Dragon Dictation  PLAN OF CARE: HOME  PATIENT DISPOSITION:  PACU - hemodynamically stable.   Delay start of Pharmacological VTE agent (>24hrs) due to surgical blood loss or risk of bleeding: not applicable  

## 2012-05-21 NOTE — Discharge Instructions (Addendum)
Your injuries were: Torn biceps tendon Bursitis Bone spur Partial rotator cuff tear   Shoulder Arthroscopy Because the shoulder is one of the most mobile joints, it is more prone to injury. It is a very shallow ball and socket joint located between the large bone in your upper arm (humerus) and the shoulder blade (scapula). Arthroscopy is a valuable test for evaluating and treating injuries involving the shoulder joint. Arthroscopy is a surgical technique which uses small incisions (cuts by the surgeon) to insert a small telescope like instrument (arthroscope) and other tools into the shoulder. This allows the surgeon to look directly at the problem. When the arthroscope is in the joint, fluid is used to expand the joint space. This allows the surgeon to examine it more easily. The arthroscope then beams light into the joint and sends an image to a TV screen. As your surgeon examines your shoulder, he or she can also repair a number of problems found at the same time. Sometimes the procedure may change to an open surgery. This would happen if the problems are severe enough that they cannot be corrected with just arthroscopy. This is usually a very safe surgery. Rare complications include damage to nerves or blood vessels, excess bleeding, blood clots, infection, and rarely instrument failure. This is most often performed as a same day surgery. This means you will not have to stay in the hospital overnight. Recovery from this surgery is also much faster than having an open procedure. LET YOUR CAREGIVER KNOW ABOUT:  Allergies.   Medications taken including herbs, eye drops, over the counter medications, and creams.   Use of steroids (by mouth or creams).   Previous problems with anesthetics or novocaine.   Possibility of pregnancy, if this applies.   History of blood clots (thrombophlebitis).   History of bleeding or blood problems.   Previous surgery.   Other health problems.   Family  history of anesthetic problems.  BEFORE THE PROCEDURE   Stop all anti-inflammatory medications at least one week before surgery unless instructed otherwise. Tell your surgeon if you have been taking cortisone or other steroids.   Do not eat or drink after midnight or as instructed. Take medications as directed by your caregiver. You may have lab tests the morning of surgery.   You should be present 60 minutes prior to your procedure or as directed.  PROCEDURE  You may have general (go to sleep) or local (numb the area) anesthetic. Your surgeon will discuss this with you. During the procedure as discussed above, your surgeon may find a variety of problems which he or she can improve or correct using small instruments. When the procedure is finished the tiny incisions will be closed with stitches or tape. AFTER YOUR PROCEDURE  After surgery you will be taken to the recovery area. A nurse will watch and check your progress. Once you are awake, stable, and taking fluids well, barring other problems you will be allowed to go home.   Once home, apply an ice pack to your operative site for twenty minutes, three to four times per day, for two to three days. This may help with discomfort and keep the swelling down.   Use a sling and medications if prescribed or as instructed.   Unless your caregiver advises otherwise, move your arm and shoulder gently and frequently following the procedure. This can help prevent stiffness and swelling.  REHABILITATION  Almost as important as your surgery is your rehabilitation. If physical therapy and  exercises are prescribed by your surgeon, follow them diligently. Once comfortable and on your way to full use, do muscle strengthening exercises as instructed.   Only take over-the-counter or prescription medicines for pain, discomfort, or fever as directed by your caregiver.  SEEK IMMEDIATE MEDICAL CARE IF:   There is redness, swelling, or increasing pain in the  wound or joint.   You notice purulent (colored- pus-like) drainage coming from the wound.   An unexplained oral temperature above 102 F (38.9 C) develops.   You notice a foul smell coming from the wound or dressing.   There is a breaking open of the wound. The edges do not stay together after sutures or tape has been removed.   Persistent bleeding from the small incision.  Document Released: 11/07/2000 Document Revised: 10/30/2011 Document Reviewed: 02/26/2009 [facility] Patient Information 2012 [place], [place].

## 2012-05-21 NOTE — Anesthesia Preprocedure Evaluation (Signed)
Anesthesia Evaluation  Patient identified by MRN, date of birth, ID band Patient awake    Reviewed: Allergy & Precautions, H&P , NPO status , Patient's Chart, lab work & pertinent test results, reviewed documented beta blocker date and time   Airway Mallampati: II TM Distance: >3 FB Neck ROM: Full    Dental No notable dental hx.    Pulmonary sleep apnea ,    Pulmonary exam normal       Cardiovascular hypertension, Pt. on medications and Pt. on home beta blockers Rhythm:Regular Rate:Normal     Neuro/Psych  Headaches,  Neuromuscular disease negative psych ROS   GI/Hepatic GERD-  ,  Endo/Other  Diabetes mellitus-, Type 2, Oral Hypoglycemic Agents  Renal/GU      Musculoskeletal  (+) Arthritis -, Osteoarthritis,    Abdominal (+) + obese,  Abdomen: soft.    Peds  Hematology negative hematology ROS (+)   Anesthesia Other Findings   Reproductive/Obstetrics                           Anesthesia Physical Anesthesia Plan  ASA: III  Anesthesia Plan: General   Post-op Pain Management:    Induction: Intravenous, Rapid sequence and Cricoid pressure planned  Airway Management Planned: Oral ETT  Additional Equipment:   Intra-op Plan:   Post-operative Plan: Extubation in OR  Informed Consent: I have reviewed the patients History and Physical, chart, labs and discussed the procedure including the risks, benefits and alternatives for the proposed anesthesia with the patient or authorized representative who has indicated his/her understanding and acceptance.   Dental advisory given  Plan Discussed with: CRNA  Anesthesia Plan Comments:         Anesthesia Quick Evaluation

## 2012-05-21 NOTE — Anesthesia Postprocedure Evaluation (Signed)
Anesthesia Post Note  Patient: Geoffrey Jackson  Procedure(s) Performed: Procedure(s) (LRB): ARTHROSCOPY SHOULDER (Right) SHOULDER ARTHROSCOPY WITH BICEPS TENDON REPAIR (Right) SUBACROMIAL DECOMPRESSION (Right)  Anesthesia type: General  Patient location: PACU  Post pain: Pain level controlled  Post assessment: Post-op Vital signs reviewed, Patient's Cardiovascular Status Stable, Respiratory Function Stable, Patent Airway, No signs of Nausea or vomiting and Pain level controlled  Last Vitals:  Filed Vitals:   05/21/12 1041  BP: 190/94  Pulse: 91  Temp: 36.5 C  Resp: 24    Post vital signs: Reviewed and stable  Level of consciousness: awake and alert   Complications: No apparent anesthesia complications

## 2012-05-21 NOTE — Op Note (Addendum)
05/21/2012  10:29 AM  PATIENT:  Geoffrey Jackson  46 y.o. male  PRE-OPERATIVE DIAGNOSIS:  right rotator cuff syndrome  POST-OPERATIVE DIAGNOSIS:  right rotator cuff syndrome, degeneration biceps tendon, partial thickness rotator cuff tear  PROCEDURE:  Procedure(s) (LRB): ARTHROSCOPY SHOULDER (Right) SHOULDER ARTHROSCOPY WITH BICEPS TENDONOTOMY (Right) SUBACROMIAL DECOMPRESSION (Right) DEBRIDEMENT ROTATOR CUFF  FINDINGS:  #1 degeneration and tearing of the biceps tendon #2 degeneration and partial tearing of the rotator cuff musculotendinous junction #3 bursitis and impingement syndrome  Details of procedure  The patient was marked in the preop area right shoulder as a surgical site confirmed and chart review was performed he was taken to the operating room given 2 g of Ancef and general anesthesia  He was placed in the modified beachchair position timeout was completed after prep and drape  Posterior portal was established scope was placed in the joint. Diagnostic arthroscopy was performed.  The biceps tendon was partially torn and degenerated with inflammation this was treated by tenotomy THOUGH AN  anterior portal  The remaining portion of the shoulder was normal with an intact rotator cuff and mild fraying of the infraspinatus.  The scope was placed in the subacromial space a lateral portal was established and a decompression was performed with acromioplasty and bursectomy  The superior portion of the cuff was very degenerated and frayed. At the musculotendinous junction there was a partial-thickness tear.  A second lateral portal was established and an attempt was made to place sutures in the partial-thickness tear. This was unsuccessful and the portal was extended. A DELTOID SPLIT WAS MADE AND AN ATTEMPT TO PLACE SUTURE IN THE PARTIAL TEAR WAS MADE. The sutures would not hold in the muscular portion of the tear and therefore the wound WAS irrigated and closed with 0  Monocryl to the deltoid split; 2-0 Monocryl in the subcutaneous tissue and staples in the skin  Portals were closed with 3-0 nylon  Subacromial space was injected with 30 cc of Marcaine with epinephrine  Shoulder sling was placed  Patient was extubated and taken to recovery room in stable condition.  Note he did have some issues with his blood pressure which were high initially and then became lower during the procedure this was handled per anesthesia see anesthesia record   SURGEON:  Surgeon(s) and Role:    * Phelan Goers E Auset Fritzler, MD - Primary  PHYSICIAN ASSISTANT:   ASSISTANTS: BETTY ASHLEY    ANESTHESIA:   general  EBL:  Total I/O In: 1000 [I.V.:1000] Out: 15 [Blood:15]  BLOOD ADMINISTERED:none  DRAINS: none   LOCAL MEDICATIONS USED:  MARCAINE   , Amount: 60 ml and OTHER EPI  SPECIMEN:  No Specimen  DISPOSITION OF SPECIMEN:  N/A  COUNTS:  YES  TOURNIQUET:  * No tourniquets in log *  DICTATION: .Dragon Dictation  PLAN OF CARE: HOME  PATIENT DISPOSITION:  PACU - hemodynamically stable.   Delay start of Pharmacological VTE agent (>24hrs) due to surgical blood loss or risk of bleeding: not applicable 

## 2012-05-21 NOTE — Interval H&P Note (Signed)
History and Physical Interval Note:  05/21/2012 7:24 AM  Geoffrey Jackson  has presented today for surgery, with the diagnosis of right rotator cuff syndrome  The various methods of treatment have been discussed with the patient and family. After consideration of risks, benefits and other options for treatment, the patient has consented to  Procedure(s) (LRB): ARTHROSCOPY RIGHT SHOULDER as a surgical intervention .  The patient's history has been reviewed, patient examined, no change in status, stable for surgery.  I have reviewed the patients' chart and labs.  Questions were answered to the patient's satisfaction.     Fuller Canada

## 2012-05-21 NOTE — Transfer of Care (Signed)
Immediate Anesthesia Transfer of Care Note  Patient: Cha Gomillion WEISGERBER  Procedure(s) Performed: Procedure(s) (LRB): ARTHROSCOPY SHOULDER (Right) SHOULDER ARTHROSCOPY WITH BICEPS TENDON REPAIR (Right) SUBACROMIAL DECOMPRESSION (Right)  Patient Location: PACU  Anesthesia Type: General  Level of Consciousness: awake  Airway & Oxygen Therapy: Patient Spontanous Breathing and non-rebreather face mask  Post-op Assessment: Report given to PACU RN, Post -op Vital signs reviewed and stable and Patient moving all extremities  Post vital signs: Reviewed and stable  Complications: No apparent anesthesia complications

## 2012-05-21 NOTE — Op Note (Signed)
05/21/2012  10:29 AM  PATIENT:  Geoffrey Jackson  46 y.o. male  PRE-OPERATIVE DIAGNOSIS:  right rotator cuff syndrome  POST-OPERATIVE DIAGNOSIS:  right rotator cuff syndrome, degeneration biceps tendon, partial thickness rotator cuff tear  PROCEDURE:  Procedure(s) (LRB): ARTHROSCOPY SHOULDER (Right) SHOULDER ARTHROSCOPY WITH BICEPS TENDONOTOMY (Right) SUBACROMIAL DECOMPRESSION (Right) DEBRIDEMENT ROTATOR CUFF  FINDINGS:  #1 degeneration and tearing of the biceps tendon #2 degeneration and partial tearing of the rotator cuff musculotendinous junction #3 bursitis and impingement syndrome  Details of procedure  The patient was marked in the preop area right shoulder as a surgical site confirmed and chart review was performed he was taken to the operating room given 2 g of Ancef and general anesthesia  He was placed in the modified beachchair position timeout was completed after prep and drape  Posterior portal was established scope was placed in the joint. Diagnostic arthroscopy was performed.  The biceps tendon was partially torn and degenerated with inflammation this was treated by tenotomy THOUGH AN  anterior portal  The remaining portion of the shoulder was normal with an intact rotator cuff and mild fraying of the infraspinatus.  The scope was placed in the subacromial space a lateral portal was established and a decompression was performed with acromioplasty and bursectomy  The superior portion of the cuff was very degenerated and frayed. At the musculotendinous junction there was a partial-thickness tear.  A second lateral portal was established and an attempt was made to place sutures in the partial-thickness tear. This was unsuccessful and the portal was extended. A DELTOID SPLIT WAS MADE AND AN ATTEMPT TO PLACE SUTURE IN THE PARTIAL TEAR WAS MADE. The sutures would not hold in the muscular portion of the tear and therefore the wound WAS irrigated and closed with 0  Monocryl to the deltoid split; 2-0 Monocryl in the subcutaneous tissue and staples in the skin  Portals were closed with 3-0 nylon  Subacromial space was injected with 30 cc of Marcaine with epinephrine  Shoulder sling was placed  Patient was extubated and taken to recovery room in stable condition.  Note he did have some issues with his blood pressure which were high initially and then became lower during the procedure this was handled per anesthesia see anesthesia record   SURGEON:  Surgeon(s) and Role:    * [name], MD - Primary  PHYSICIAN ASSISTANT:   ASSISTANTS: BETTY ASHLEY    ANESTHESIA:   general  EBL:  Total I/O In: 1000 [I.V.:1000] Out: 15 [Blood:15]  BLOOD ADMINISTERED:none  DRAINS: none   LOCAL MEDICATIONS USED:  MARCAINE   , Amount: 60 ml and OTHER EPI  SPECIMEN:  No Specimen  DISPOSITION OF SPECIMEN:  N/A  COUNTS:  YES  TOURNIQUET:  * No tourniquets in log *  DICTATION: .Dragon Dictation  PLAN OF CARE: HOME  PATIENT DISPOSITION:  PACU - hemodynamically stable.   Delay start of Pharmacological VTE agent (>24hrs) due to surgical blood loss or risk of bleeding: not applicable

## 2012-05-24 ENCOUNTER — Encounter: Payer: Self-pay | Admitting: Orthopedic Surgery

## 2012-05-24 ENCOUNTER — Ambulatory Visit (INDEPENDENT_AMBULATORY_CARE_PROVIDER_SITE_OTHER): Payer: BC Managed Care – PPO | Admitting: Orthopedic Surgery

## 2012-05-24 VITALS — BP 160/90 | Ht 73.0 in | Wt 264.0 lb

## 2012-05-24 DIAGNOSIS — M66329 Spontaneous rupture of flexor tendons, unspecified upper arm: Secondary | ICD-10-CM

## 2012-05-24 DIAGNOSIS — M25819 Other specified joint disorders, unspecified shoulder: Secondary | ICD-10-CM

## 2012-05-24 DIAGNOSIS — IMO0002 Reserved for concepts with insufficient information to code with codable children: Secondary | ICD-10-CM

## 2012-05-24 DIAGNOSIS — M75120 Complete rotator cuff tear or rupture of unspecified shoulder, not specified as traumatic: Secondary | ICD-10-CM

## 2012-05-24 NOTE — Progress Notes (Signed)
Patient ID: Geoffrey Jackson, male   DOB: [birth year], 46 y.o.   MRN: [id number] Chief Complaint  Patient presents with  . Routine Post Op    post op 1, Rt shoulder DOS 05/22/12    BP 160/90  Ht 6\' 1"  (1.854 m)  Wt 264 lb (119.75 kg)  BMI 34.83 kg/m2   Op note  05/21/2012  10:29 AM  PATIENT: Geoffrey Jackson 46 y.o. male  PRE-OPERATIVE DIAGNOSIS: right rotator cuff syndrome  POST-OPERATIVE DIAGNOSIS: right rotator cuff syndrome, degeneration biceps tendon, partial thickness rotator cuff tear  PROCEDURE: Procedure(s) (LRB):  ARTHROSCOPY SHOULDER (Right)  SHOULDER ARTHROSCOPY WITH BICEPS TENDONOTOMY (Right)  SUBACROMIAL DECOMPRESSION (Right)  DEBRIDEMENT ROTATOR CUFF  FINDINGS:  #1 degeneration and tearing of the biceps tendon  #2 degeneration and partial tearing of the rotator cuff musculotendinous junction  #3 bursitis and impingement syndrome  This pain is fairly well controlled on Percocet and Robaxin. His wounds look good,  We'll take  staples out. Next visit  We placed him in a sling shot.  He is allowed range of motion exercises and his elbow only.  Start therapy in 4 weeks take staples out in one week

## 2012-05-24 NOTE — Patient Instructions (Signed)
Wear sling shot   May remove for bathing and dressing  Keep arm close to the body   3 x a day 10-15 arm flexes

## 2012-05-25 ENCOUNTER — Encounter (HOSPITAL_COMMUNITY): Payer: Self-pay | Admitting: Orthopedic Surgery

## 2012-06-01 ENCOUNTER — Encounter: Payer: Self-pay | Admitting: Orthopedic Surgery

## 2012-06-01 ENCOUNTER — Ambulatory Visit (INDEPENDENT_AMBULATORY_CARE_PROVIDER_SITE_OTHER): Payer: BC Managed Care – PPO | Admitting: Orthopedic Surgery

## 2012-06-01 VITALS — BP 142/96 | Ht 73.0 in | Wt 264.0 lb

## 2012-06-01 DIAGNOSIS — Z9889 Other specified postprocedural states: Secondary | ICD-10-CM

## 2012-06-01 MED ORDER — OXYCODONE-ACETAMINOPHEN 5-325 MG PO TABS: 1.0000 | ORAL_TABLET | ORAL | Status: DC | PRN

## 2012-06-01 NOTE — Patient Instructions (Signed)
START THERAPY ON July 29TH   WEAR SLING X 20 DAYS   CONTINUE ARM EXERCISES

## 2012-06-01 NOTE — Progress Notes (Signed)
Patient ID: Geoffrey Jackson, male   DOB: 1966/08/04, 46 y.o.   MRN: [id number] Chief Complaint  Patient presents with  . Routine Post Op    post op 2/ remove staples Right shoulder, DOS 05/21/12    BP 142/96  Ht 6\' 1"  (1.854 m)  Wt 264 lb (119.75 kg)  BMI 34.83 kg/m2  Wound evaluation and staple removal.  Patient is to start physical therapy, July 29

## 2012-06-07 ENCOUNTER — Telehealth: Payer: Self-pay | Admitting: Orthopedic Surgery

## 2012-06-07 NOTE — Telephone Encounter (Signed)
Patient called, requests that he have his physical therapy at [facility] where order was originally faxed.  Re-faxed orders to [facility] in [place] (Fax [phone]) per patient request.  Also called their office (ph# [phone]) to notify.

## 2012-06-09 ENCOUNTER — Other Ambulatory Visit: Payer: Self-pay | Admitting: *Deleted

## 2012-06-09 DIAGNOSIS — Z9889 Other specified postprocedural states: Secondary | ICD-10-CM

## 2012-06-21 ENCOUNTER — Other Ambulatory Visit: Payer: Self-pay | Admitting: Orthopedic Surgery

## 2012-06-21 DIAGNOSIS — Z9889 Other specified postprocedural states: Secondary | ICD-10-CM

## 2012-07-05 ENCOUNTER — Encounter: Payer: Self-pay | Admitting: Orthopedic Surgery

## 2012-07-05 ENCOUNTER — Ambulatory Visit (INDEPENDENT_AMBULATORY_CARE_PROVIDER_SITE_OTHER): Payer: BC Managed Care – PPO | Admitting: Orthopedic Surgery

## 2012-07-05 VITALS — BP 154/80 | Ht 73.0 in | Wt 272.0 lb

## 2012-07-05 DIAGNOSIS — Z9889 Other specified postprocedural states: Secondary | ICD-10-CM

## 2012-07-05 MED ORDER — OXYCODONE-ACETAMINOPHEN 5-325 MG PO TABS: 1.0000 | ORAL_TABLET | ORAL | Status: DC | PRN

## 2012-07-05 NOTE — Progress Notes (Signed)
Patient ID: Geoffrey Jackson, male   DOB: Apr 02, 1966, 46 y.o.   MRN: [id number] Chief Complaint  Patient presents with  . Follow-up    recheck right shoulder, DOS 05/21/12    BP 154/80  Ht 6\' 1"  (1.854 m)  Wt 272 lb (123.378 kg)  BMI 35.89 kg/m2  Status post rotator cuff repair, RIGHT shoulder with biceps tenodesis tenotomy.  Recommend continue physical therapy. At pheresis 1st periscapular pain and biceps pain.  Primary complaints of medial scapular pain and pain in the biceps tendon and muscle. Shoulder flexion 150 plus.  Continue therapy, and I'll see Geoffrey Jackson in 6 weeks

## 2012-07-05 NOTE — Patient Instructions (Addendum)
Continue PT and home exercise program   Apply Max freeze (Over the counter ) to biceps as needed   Return in 2 week to pick up refill of pain medication (no office visit needed)

## 2012-07-19 ENCOUNTER — Telehealth: Payer: Self-pay | Admitting: Orthopedic Surgery

## 2012-07-19 ENCOUNTER — Other Ambulatory Visit: Payer: Self-pay | Admitting: Orthopedic Surgery

## 2012-07-19 DIAGNOSIS — Z9889 Other specified postprocedural states: Secondary | ICD-10-CM

## 2012-07-19 MED ORDER — OXYCODONE-ACETAMINOPHEN 5-325 MG PO TABS: 1.0000 | ORAL_TABLET | ORAL | Status: DC | PRN

## 2012-07-19 NOTE — Telephone Encounter (Signed)
Please advise that medication will have to be changed after this prescription to hydrocodone

## 2012-07-19 NOTE — Telephone Encounter (Signed)
Tucker Minter' wife, [name] called and said Geoffrey Jackson wants a new Percocet prescription.  Will call him at [phone] when it is ready

## 2012-07-19 NOTE — Telephone Encounter (Signed)
Advised that this will have to be the last Percocet prescription

## 2012-07-21 NOTE — Telephone Encounter (Signed)
Patient, picked up the prescription

## 2012-08-05 ENCOUNTER — Other Ambulatory Visit: Payer: Self-pay | Admitting: Orthopedic Surgery

## 2012-08-05 ENCOUNTER — Telehealth: Payer: Self-pay | Admitting: Orthopedic Surgery

## 2012-08-05 DIAGNOSIS — Z9889 Other specified postprocedural states: Secondary | ICD-10-CM

## 2012-08-05 MED ORDER — HYDROCODONE-ACETAMINOPHEN 10-325 MG PO TABS: 1.0000 | ORAL_TABLET | ORAL | Status: DC | PRN

## 2012-08-05 NOTE — Telephone Encounter (Signed)
Geoffrey Jackson wants a prescription for Hydrocodone  sent to [facility].

## 2012-08-16 ENCOUNTER — Ambulatory Visit (INDEPENDENT_AMBULATORY_CARE_PROVIDER_SITE_OTHER): Payer: BC Managed Care – PPO | Admitting: Orthopedic Surgery

## 2012-08-16 ENCOUNTER — Encounter: Payer: Self-pay | Admitting: Orthopedic Surgery

## 2012-08-16 VITALS — BP 140/82 | Ht 73.0 in | Wt 272.0 lb

## 2012-08-16 DIAGNOSIS — M67919 Unspecified disorder of synovium and tendon, unspecified shoulder: Secondary | ICD-10-CM

## 2012-08-16 DIAGNOSIS — Z9889 Other specified postprocedural states: Secondary | ICD-10-CM

## 2012-08-16 DIAGNOSIS — M75101 Unspecified rotator cuff tear or rupture of right shoulder, not specified as traumatic: Secondary | ICD-10-CM

## 2012-08-16 MED ORDER — HYDROCODONE-ACETAMINOPHEN 10-325 MG PO TABS: 1.0000 | ORAL_TABLET | ORAL | Status: DC | PRN

## 2012-08-16 NOTE — Progress Notes (Signed)
Patient ID: Geoffrey Jackson, male   DOB: 09/09/1966, 46 y.o.   MRN: [id number] Chief Complaint  Patient presents with  . Follow-up    recheck right shoulder, DOS 05/21/12    The patient complains of pain at terminal flexion is regained full range of motion is good strength he has some loss of internal rotation  Recommend continued therapy. He has been somewhat inconsistent going to therapy I think he needs to strength and scapular stabilizers and improve his internal rotation and then most of his symptoms should resolve  Followup in 2 months continue hydrocodone 10 mg

## 2012-08-16 NOTE — Patient Instructions (Addendum)
Continue the rehab

## 2012-09-08 NOTE — Telephone Encounter (Signed)
No note from doctor °

## 2012-10-11 ENCOUNTER — Ambulatory Visit (INDEPENDENT_AMBULATORY_CARE_PROVIDER_SITE_OTHER): Payer: BC Managed Care – PPO | Admitting: Orthopedic Surgery

## 2012-10-11 ENCOUNTER — Encounter: Payer: Self-pay | Admitting: Orthopedic Surgery

## 2012-10-11 DIAGNOSIS — M25519 Pain in unspecified shoulder: Secondary | ICD-10-CM

## 2012-10-12 NOTE — Progress Notes (Signed)
Patient ID: Geoffrey Jackson, male   DOB: 23-Jul-1966, 46 y.o.   MRN: [id number] No show

## 2012-10-18 ENCOUNTER — Other Ambulatory Visit: Payer: Self-pay | Admitting: Orthopedic Surgery

## 2012-10-18 DIAGNOSIS — G8929 Other chronic pain: Secondary | ICD-10-CM

## 2012-10-18 MED ORDER — HYDROCODONE-ACETAMINOPHEN 7.5-325 MG PO TABS: 1.0000 | ORAL_TABLET | ORAL | Status: DC | PRN

## 2012-12-14 ENCOUNTER — Telehealth: Payer: Self-pay | Admitting: *Deleted

## 2012-12-14 NOTE — Telephone Encounter (Signed)
Patient request refill for Robaxin 500mg   #[facility] Pharmacy [facility]

## 2012-12-15 ENCOUNTER — Other Ambulatory Visit: Payer: Self-pay | Admitting: Orthopedic Surgery

## 2012-12-15 DIAGNOSIS — Z9889 Other specified postprocedural states: Secondary | ICD-10-CM

## 2012-12-15 MED ORDER — METHOCARBAMOL 500 MG PO TABS: 500.0000 mg | ORAL_TABLET | Freq: Four times a day (QID) | ORAL | Status: DC

## 2012-12-15 NOTE — Telephone Encounter (Signed)
i ve refilled this one 530 weds

## 2012-12-22 ENCOUNTER — Encounter: Payer: Self-pay | Admitting: Orthopedic Surgery

## 2012-12-22 ENCOUNTER — Ambulatory Visit (INDEPENDENT_AMBULATORY_CARE_PROVIDER_SITE_OTHER): Payer: BC Managed Care – PPO | Admitting: Orthopedic Surgery

## 2012-12-22 VITALS — BP 180/98 | Ht 73.0 in | Wt 272.0 lb

## 2012-12-22 DIAGNOSIS — G8929 Other chronic pain: Secondary | ICD-10-CM

## 2012-12-22 DIAGNOSIS — M25519 Pain in unspecified shoulder: Secondary | ICD-10-CM

## 2012-12-22 MED ORDER — HYDROCODONE-ACETAMINOPHEN 7.5-325 MG PO TABS: 1.0000 | ORAL_TABLET | ORAL | Status: DC | PRN

## 2012-12-22 NOTE — Patient Instructions (Addendum)
You have received a steroid shot. 15% of patients experience increased pain at the injection site with in the next 24 hours. This is best treated with ice and tylenol extra strength 2 tabs every 8 hours. If you are still having pain please call the office.    

## 2012-12-22 NOTE — Progress Notes (Signed)
Patient ID: Geoffrey Jackson, male   DOB: 24-Apr-1966, 47 y.o.   MRN: [id number] Chief Complaint  Patient presents with  . Arm Pain    arm pain over the biceps      Tomothy continues to complain of pain over the medial aspect of his biceps muscle and short head of his biceps  He complains of some clicking in his subacromial space. He also complains of some weakness in his rotator cuff  His previous procedures are as follows  05/21/2012 PROCEDURE: Procedure(s) (LRB):  ARTHROSCOPY SHOULDER (Right)  SHOULDER ARTHROSCOPY WITH BICEPS TENDONOTOMY (Right)  SUBACROMIAL DECOMPRESSION (Right)  DEBRIDEMENT ROTATOR CUFF  FINDINGS:  #1 degeneration and tearing of the biceps tendon  #2 degeneration and partial tearing of the rotator cuff musculotendinous junction  #3 bursitis and impingement syndrome  Review of systems he denies neck pain he denies numbness and tingling he denies any radicular symptoms  BP 180/98  Ht 6\' 1"  (1.854 m)  Wt 272 lb (123.378 kg)  BMI 35.89 kg/m2  Physical Exam(6) GENERAL: normal development   CDV: pulses are normal   Skin: normal  Psychiatric: awake, alert and oriented  Neuro: normal sensation  MSK 1 his incision is clean dry and intact with range of motion there is crepitance and the rotator interval. There is tenderness over the medial head of the biceps distinct from the lateral head. 2 both biceps appears similar in appearance with normal flexion power normal supination power negative Yergason test mild pain with the speed's test medially 3 stability normal 4 cuff strength normal biceps strength normal  Imaging: No imaging  Assessment: Tendinitis of the short head of the biceps    Plan: Recommend injection return in 3 weeks continue Norco 7.5 if no improvement consider MRI shoulder

## 2013-01-17 ENCOUNTER — Ambulatory Visit (INDEPENDENT_AMBULATORY_CARE_PROVIDER_SITE_OTHER): Payer: BC Managed Care – PPO | Admitting: Orthopedic Surgery

## 2013-01-17 VITALS — BP 190/100 | Ht 73.0 in | Wt 272.0 lb

## 2013-01-17 DIAGNOSIS — M25511 Pain in right shoulder: Secondary | ICD-10-CM

## 2013-01-17 DIAGNOSIS — M7521 Bicipital tendinitis, right shoulder: Secondary | ICD-10-CM | POA: Insufficient documentation

## 2013-01-17 DIAGNOSIS — M7520 Bicipital tendinitis, unspecified shoulder: Secondary | ICD-10-CM

## 2013-01-17 DIAGNOSIS — M25519 Pain in unspecified shoulder: Secondary | ICD-10-CM

## 2013-01-17 NOTE — Patient Instructions (Signed)
You have received a steroid shot. 15% of patients experience increased pain at the injection site with in the next 24 hours. This is best treated with ice and tylenol extra strength 2 tabs every 8 hours. If you are still having pain please call the office.    

## 2013-01-17 NOTE — Progress Notes (Signed)
Patient ID: Geoffrey Jackson, male   DOB: 10-11-1966, 47 y.o.   MRN: [id number] Chief Complaint  Patient presents with  . Follow-up    right shoulder pain and injection    BP 190/100  Ht 6\' 1"  (1.854 m)  Wt 272 lb (123.378 kg)  BMI 35.89 kg/m2    Status post injection patient presents back for reevaluation  He said he got 50% pain relief for a medial head bicep injection near the junction of the biceps and deltoid.  No fever chills no pain in the glenohumeral joint. Still tenderness noted over the medial portion of the biceps. Shoulder range of motion remains normal shoulder but stable strength remains intact skin is intact pulses are good sensation is normal  Impression persistent tendinitis in the short head of biceps  Recommend repeat injection   Right shoulder injection  Verbal consent time out 20 maximal tenderness over the medial biceps injected with 25-gauge needle 40 mg of Depo-Medrol 4 cc 1% lidocaine tolerated well without complication

## 2013-02-14 ENCOUNTER — Ambulatory Visit: Payer: BC Managed Care – PPO | Admitting: Orthopedic Surgery

## 2013-02-22 ENCOUNTER — Ambulatory Visit: Payer: BC Managed Care – PPO | Admitting: Orthopedic Surgery

## 2013-03-01 ENCOUNTER — Encounter: Payer: Self-pay | Admitting: Orthopedic Surgery

## 2013-03-01 ENCOUNTER — Ambulatory Visit (INDEPENDENT_AMBULATORY_CARE_PROVIDER_SITE_OTHER): Payer: BC Managed Care – PPO | Admitting: Orthopedic Surgery

## 2013-03-01 VITALS — BP 182/94 | Ht 73.0 in | Wt 272.0 lb

## 2013-03-01 DIAGNOSIS — S46211D Strain of muscle, fascia and tendon of other parts of biceps, right arm, subsequent encounter: Secondary | ICD-10-CM

## 2013-03-01 DIAGNOSIS — G8929 Other chronic pain: Secondary | ICD-10-CM

## 2013-03-01 DIAGNOSIS — Z5189 Encounter for other specified aftercare: Secondary | ICD-10-CM

## 2013-03-01 DIAGNOSIS — M25519 Pain in unspecified shoulder: Secondary | ICD-10-CM

## 2013-03-01 DIAGNOSIS — S46219A Strain of muscle, fascia and tendon of other parts of biceps, unspecified arm, initial encounter: Secondary | ICD-10-CM | POA: Insufficient documentation

## 2013-03-01 MED ORDER — CYCLOBENZAPRINE HCL 10 MG PO TABS: 10.0000 mg | ORAL_TABLET | Freq: Three times a day (TID) | ORAL | Status: DC | PRN

## 2013-03-01 MED ORDER — HYDROCODONE-ACETAMINOPHEN 7.5-325 MG PO TABS: 1.0000 | ORAL_TABLET | ORAL | Status: DC | PRN

## 2013-03-01 NOTE — Patient Instructions (Addendum)
Mri

## 2013-03-01 NOTE — Progress Notes (Signed)
Patient ID: Geoffrey Jackson, male   DOB: 1966/01/25, 47 y.o.   MRN: [id number] Chief Complaint  Patient presents with  . Follow-up    4 week recheck right shoulder.    History this patient had arthroscopic surgery of his right shoulder. Did well for short period of time then had a repeat arthroscopic procedure and open rotator cuff evaluation which resulted in the biceps tenotomy and rotator cuff repair  He did well but continues to have pain over the right biceps. He has fatigue and constant pain she's had 3 injections he went through a course of physical therapy with her home program has been on muscle relaxers as well as Norco for pain  He continues with persistent symptoms  He does not have any neck pain at this time there is no numbness or tingling  BP 182/94  Ht 6\' 1"  (1.854 m)  Wt 272 lb (123.378 kg)  BMI 35.89 kg/m2 General appearance is normal, the patient is alert and oriented x3 with normal mood and affect.  Right shoulder strength is normal he has tenderness over the proximal portion of the biceps in the bicipital groove is full range of motion of the shoulder no instability is weakness in his biceps. Scans intact pulses are good sensation is normal  Impression biceps tendon tear. I suspect that his biceps tenotomy is causing difficulty and will need biceps tenodesis surgery.  I will like to image the biceps in the bicipital groove to check for subscapularis tear as well as pectoralis tear and to evaluate the position and condition of the biceps tendon  MRI  Flexeril

## 2013-03-09 ENCOUNTER — Telehealth: Payer: Self-pay | Admitting: Radiology

## 2013-03-09 NOTE — Telephone Encounter (Signed)
I called to get a precert for MRI they wanted Dr. Romeo Apple to do a peer to peer call. The doctor to doctor review expires on 03-11-13. The # [phone], option #2.

## 2013-03-11 NOTE — Telephone Encounter (Signed)
Just tell him they did not approve it

## 2013-03-24 ENCOUNTER — Encounter (HOSPITAL_COMMUNITY): Payer: Self-pay

## 2013-04-13 ENCOUNTER — Encounter (HOSPITAL_COMMUNITY): Payer: Self-pay

## 2013-04-13 ENCOUNTER — Observation Stay (HOSPITAL_COMMUNITY)
Admission: EM | Admit: 2013-04-13 | Discharge: 2013-04-14 | Disposition: A | Discharge status: Home / Self Care | Payer: BC Managed Care – PPO | Attending: Family Medicine | Admitting: Family Medicine

## 2013-04-13 ENCOUNTER — Emergency Department (HOSPITAL_COMMUNITY): Payer: BC Managed Care – PPO

## 2013-04-13 DIAGNOSIS — R0789 Other chest pain: Principal | ICD-10-CM | POA: Insufficient documentation

## 2013-04-13 DIAGNOSIS — Z6836 Body mass index (BMI) 36.0-36.9, adult: Secondary | ICD-10-CM | POA: Insufficient documentation

## 2013-04-13 DIAGNOSIS — R42 Dizziness and giddiness: Secondary | ICD-10-CM | POA: Insufficient documentation

## 2013-04-13 DIAGNOSIS — I10 Essential (primary) hypertension: Secondary | ICD-10-CM | POA: Diagnosis present

## 2013-04-13 DIAGNOSIS — K219 Gastro-esophageal reflux disease without esophagitis: Secondary | ICD-10-CM | POA: Diagnosis present

## 2013-04-13 DIAGNOSIS — G4730 Sleep apnea, unspecified: Secondary | ICD-10-CM

## 2013-04-13 DIAGNOSIS — Z91199 Patient's noncompliance with other medical treatment and regimen due to unspecified reason: Secondary | ICD-10-CM | POA: Insufficient documentation

## 2013-04-13 DIAGNOSIS — E119 Type 2 diabetes mellitus without complications: Secondary | ICD-10-CM | POA: Insufficient documentation

## 2013-04-13 DIAGNOSIS — N400 Enlarged prostate without lower urinary tract symptoms: Secondary | ICD-10-CM | POA: Diagnosis present

## 2013-04-13 DIAGNOSIS — Z9119 Patient's noncompliance with other medical treatment and regimen: Secondary | ICD-10-CM | POA: Insufficient documentation

## 2013-04-13 DIAGNOSIS — E1169 Type 2 diabetes mellitus with other specified complication: Secondary | ICD-10-CM | POA: Diagnosis present

## 2013-04-13 DIAGNOSIS — Z79899 Other long term (current) drug therapy: Secondary | ICD-10-CM | POA: Insufficient documentation

## 2013-04-13 DIAGNOSIS — R0602 Shortness of breath: Secondary | ICD-10-CM | POA: Insufficient documentation

## 2013-04-13 DIAGNOSIS — E785 Hyperlipidemia, unspecified: Secondary | ICD-10-CM | POA: Diagnosis present

## 2013-04-13 DIAGNOSIS — E876 Hypokalemia: Secondary | ICD-10-CM | POA: Diagnosis present

## 2013-04-13 DIAGNOSIS — E669 Obesity, unspecified: Secondary | ICD-10-CM | POA: Diagnosis present

## 2013-04-13 DIAGNOSIS — IMO0001 Reserved for inherently not codable concepts without codable children: Secondary | ICD-10-CM

## 2013-04-13 DIAGNOSIS — R079 Chest pain, unspecified: Secondary | ICD-10-CM | POA: Diagnosis present

## 2013-04-13 LAB — LIPID PANEL
HDL: 40 mg/dL (ref >39)
LDL Cholesterol: 106 mg/dL — ABNORMAL HIGH (ref 0–99)
Total CHOL/HDL Ratio: 3.9 RATIO
Triglycerides: 52 mg/dL (ref <150)
VLDL: 10 mg/dL (ref 0–40)

## 2013-04-13 LAB — BASIC METABOLIC PANEL
BUN: 12 mg/dL (ref 6–23)
CO2: 27 mEq/L (ref 19–32)
GFR calc non Af Amer: 80 mL/min — ABNORMAL LOW (ref >90)
Glucose, Bld: 170 mg/dL — ABNORMAL HIGH (ref 70–99)
Potassium: 3 mEq/L — ABNORMAL LOW (ref 3.5–5.1)
Sodium: 143 mEq/L (ref 135–145)

## 2013-04-13 LAB — URINALYSIS, ROUTINE W REFLEX MICROSCOPIC
Glucose, UA: 100 mg/dL — AB
Leukocytes, UA: NEGATIVE
Protein, ur: NEGATIVE mg/dL
Specific Gravity, Urine: 1.02 (ref 1.005–1.030)
Urobilinogen, UA: 0.2 mg/dL (ref 0.0–1.0)

## 2013-04-13 LAB — CBC
Hemoglobin: 15.4 g/dL (ref 13.0–17.0)
MCHC: 33.8 g/dL (ref 30.0–36.0)
RBC: 4.95 MIL/uL (ref 4.22–5.81)
WBC: 5.4 10*3/uL (ref 4.0–10.5)

## 2013-04-13 LAB — GLUCOSE, CAPILLARY: Glucose-Capillary: 98 mg/dL (ref 70–99)

## 2013-04-13 LAB — URINE MICROSCOPIC-ADD ON

## 2013-04-13 MED ORDER — HYDROCODONE-ACETAMINOPHEN 5-325 MG PO TABS
1.0000 | ORAL_TABLET | ORAL | Status: DC | PRN
  Administered 2013-04-14: 2 via ORAL
  Filled 2013-04-13: qty 2

## 2013-04-13 MED ORDER — LISINOPRIL 10 MG PO TABS
20.0000 mg | ORAL_TABLET | Freq: Every day | ORAL | Status: DC
  Administered 2013-04-13 – 2013-04-14 (×2): 20 mg via ORAL
  Filled 2013-04-13 (×2): qty 2

## 2013-04-13 MED ORDER — ACETAMINOPHEN 500 MG PO TABS
1000.0000 mg | ORAL_TABLET | Freq: Once | ORAL | Status: AC
  Administered 2013-04-13: 1000 mg via ORAL
  Filled 2013-04-13: qty 2

## 2013-04-13 MED ORDER — SODIUM CHLORIDE 0.9 % IV SOLN: INTRAVENOUS | Status: DC

## 2013-04-13 MED ORDER — ACETAMINOPHEN 650 MG RE SUPP: 650.0000 mg | Freq: Four times a day (QID) | RECTAL | Status: DC | PRN

## 2013-04-13 MED ORDER — METOPROLOL SUCCINATE ER 50 MG PO TB24
50.0000 mg | ORAL_TABLET | Freq: Every day | ORAL | Status: DC
  Administered 2013-04-13 – 2013-04-14 (×2): 50 mg via ORAL
  Filled 2013-04-13 (×2): qty 1

## 2013-04-13 MED ORDER — ASPIRIN 81 MG PO CHEW
324.0000 mg | CHEWABLE_TABLET | Freq: Once | ORAL | Status: AC
  Administered 2013-04-13: 324 mg via ORAL
  Filled 2013-04-13: qty 4

## 2013-04-13 MED ORDER — SODIUM CHLORIDE 0.9 % IV SOLN
INTRAVENOUS | Status: DC
  Administered 2013-04-13: 13:00:00 via INTRAVENOUS

## 2013-04-13 MED ORDER — ONDANSETRON HCL 4 MG/2ML IJ SOLN: 4.0000 mg | Freq: Three times a day (TID) | INTRAMUSCULAR | Status: AC | PRN

## 2013-04-13 MED ORDER — INSULIN ASPART 100 UNIT/ML ~~LOC~~ SOLN
0.0000 [IU] | Freq: Three times a day (TID) | SUBCUTANEOUS | Status: DC
  Administered 2013-04-14: 2 [IU] via SUBCUTANEOUS
  Administered 2013-04-14: 1 [IU] via SUBCUTANEOUS

## 2013-04-13 MED ORDER — ATORVASTATIN CALCIUM 20 MG PO TABS
20.0000 mg | ORAL_TABLET | Freq: Every day | ORAL | Status: DC
  Administered 2013-04-13: 20 mg via ORAL
  Filled 2013-04-13: qty 1

## 2013-04-13 MED ORDER — NITROGLYCERIN 0.4 MG SL SUBL
0.4000 mg | SUBLINGUAL_TABLET | SUBLINGUAL | Status: AC | PRN
  Administered 2013-04-13 (×3): 0.4 mg via SUBLINGUAL
  Filled 2013-04-13: qty 25

## 2013-04-13 MED ORDER — POTASSIUM CHLORIDE CRYS ER 20 MEQ PO TBCR
40.0000 meq | EXTENDED_RELEASE_TABLET | ORAL | Status: AC
  Administered 2013-04-13 (×2): 40 meq via ORAL
  Filled 2013-04-13 (×2): qty 2

## 2013-04-13 MED ORDER — NITROGLYCERIN 0.4 MG SL SUBL: 0.4000 mg | SUBLINGUAL_TABLET | SUBLINGUAL | Status: DC | PRN

## 2013-04-13 MED ORDER — ACETAMINOPHEN 325 MG PO TABS
650.0000 mg | ORAL_TABLET | Freq: Four times a day (QID) | ORAL | Status: DC | PRN
  Administered 2013-04-13: 650 mg via ORAL
  Filled 2013-04-13 (×2): qty 2

## 2013-04-13 MED ORDER — ASPIRIN EC 325 MG PO TBEC
325.0000 mg | DELAYED_RELEASE_TABLET | Freq: Every day | ORAL | Status: DC
  Administered 2013-04-13 – 2013-04-14 (×2): 325 mg via ORAL
  Filled 2013-04-13 (×2): qty 1

## 2013-04-13 MED ORDER — ENOXAPARIN SODIUM 60 MG/0.6ML ~~LOC~~ SOLN
60.0000 mg | Freq: Every day | SUBCUTANEOUS | Status: DC
  Administered 2013-04-13 – 2013-04-14 (×2): 60 mg via SUBCUTANEOUS
  Filled 2013-04-13: qty 0.6

## 2013-04-13 MED ORDER — ONDANSETRON HCL 4 MG/2ML IJ SOLN
4.0000 mg | Freq: Once | INTRAMUSCULAR | Status: AC
  Administered 2013-04-13: 4 mg via INTRAVENOUS
  Filled 2013-04-13: qty 2

## 2013-04-13 MED ORDER — SODIUM CHLORIDE 0.9 % IJ SOLN
3.0000 mL | Freq: Two times a day (BID) | INTRAMUSCULAR | Status: DC
  Administered 2013-04-13 – 2013-04-14 (×2): 3 mL via INTRAVENOUS

## 2013-04-13 MED ORDER — MORPHINE SULFATE 2 MG/ML IJ SOLN: 1.0000 mg | INTRAMUSCULAR | Status: DC | PRN

## 2013-04-13 NOTE — ED Notes (Signed)
Made aware that urine sample is needed. Unable to obtain one at this time.

## 2013-04-13 NOTE — ED Notes (Signed)
Complain of hypertension, chest tightness and sweating

## 2013-04-13 NOTE — ED Provider Notes (Signed)
History     CSN: [id number]  Arrival date & time 04/13/13  4098   First MD Initiated Contact with Patient 04/13/13 [id number]      Chief Complaint  Patient presents with  . Hypertension    (Consider location/radiation/quality/duration/timing/severity/associated sxs/prior treatment) HPI Comments: Geoffrey Jackson is a 47 y.o. Male presenting with elevated blood pressure in association with chest tightness and pressure, describing intermittent stabs of pain in his left chest starting last night around 11 PM.  He also has experienced vague shortness of breath, nausea with 2 episodes of emesis and has had diaphoresis and numbness in his left hand which has been intermittent.  He does report episodes in the past the bag chest pressure, but states he is really not paid attention to these other episodes as they were very mild and resolved spontaneously.  He was at work this morning when his symptoms worsened and was promptly sent here for further evaluation.  He describes having a stress test early 2000 which was negative.  Cardiac Risk factors include hypertension, hypercholesterolemia and borderline diabetes.  He has taken no medications prior to arrival.  He has found no alleviators or aggravators of the symptoms.    The history is provided by the patient.    Past Medical History  Diagnosis Date  . GERD (gastroesophageal reflux disease)   . Hyperlipidemia   . Hypertension   . Low back pain   . Benign prostatic hypertrophy     Past Surgical History  Procedure Laterality Date  . A spinal surgery    . Shoulder arthroscopy    . Shoulder arthroscopy  05/21/2012    Procedure: ARTHROSCOPY SHOULDER;  Surgeon: [name], MD;  Location: [facility];  Service: Orthopedics;  Laterality: Right;  . Subacromial decompression  05/21/2012    Procedure: SUBACROMIAL DECOMPRESSION;  Surgeon: [name], MD;  Location: [facility];  Service: Orthopedics;  Laterality: Right;    No family history on  file.  History  Substance Use Topics  . Smoking status: Never Smoker   . Smokeless tobacco: Not on file  . Alcohol Use: No      Review of Systems  Constitutional: Negative for fever.  HENT: Negative for congestion, sore throat and neck pain.   Eyes: Negative.   Respiratory: Positive for chest tightness and shortness of breath.   Cardiovascular: Positive for chest pain. Negative for palpitations.  Gastrointestinal: Positive for nausea and vomiting. Negative for abdominal pain.  Genitourinary: Negative.   Musculoskeletal: Negative for joint swelling and arthralgias.  Skin: Negative.  Negative for rash and wound.  Neurological: Negative for dizziness, weakness, light-headedness, numbness and headaches.  Psychiatric/Behavioral: Negative.     Allergies  Demerol and Lansoprazole  Home Medications   Current Outpatient Rx  Name  Route  Sig  Dispense  Refill  . atorvastatin (LIPITOR) 20 MG tablet   Oral   Take 20 mg by mouth daily.           . hydrochlorothiazide 25 MG tablet   Oral   Take 25 mg by mouth daily.          Marland Kitchen HYDROcodone-acetaminophen (NORCO) 7.5-325 MG per tablet   Oral   Take 1 tablet by mouth every 4 (four) hours as needed for pain.   60 tablet   5     Note dose reduction   . ibuprofen (ADVIL,MOTRIN) 800 MG tablet   Oral   Take 800 mg by mouth every 8 (eight) hours as needed.         Marland Kitchen  lisinopril (PRINIVIL,ZESTRIL) 20 MG tablet   Oral   Take 20 mg by mouth daily.         . metFORMIN (GLUCOPHAGE) 500 MG tablet   Oral   Take 500 mg by mouth 2 (two) times daily with a meal.          . metoprolol (TOPROL XL) 50 MG 24 hr tablet   Oral   Take 50 mg by mouth daily.          . nebivolol (BYSTOLIC) 5 MG tablet   Oral   Take 5 mg by mouth daily.         Marland Kitchen olmesartan (BENICAR) 20 MG tablet   Oral   Take 20 mg by mouth daily.           BP 138/110  Pulse 66  Temp(Src) 98.4 F (36.9 C) (Oral)  Resp 18  Ht 6\' 1"  (1.854 m)  Wt 280  lb (127.007 kg)  BMI 36.95 kg/m2  SpO2 98%  Physical Exam  Nursing note and vitals reviewed. Constitutional: He appears well-developed and well-nourished.  Obese  HENT:  Head: Normocephalic and atraumatic.  Eyes: Conjunctivae are normal.  Neck: Normal range of motion.  Cardiovascular: Normal rate, regular rhythm, normal heart sounds and intact distal pulses.   Pulmonary/Chest: Effort normal and breath sounds normal. He has no wheezes.  Abdominal: Soft. Bowel sounds are normal. There is no tenderness.  Musculoskeletal: Normal range of motion.  Neurological: He is alert.  Skin: Skin is warm and dry.  Psychiatric: He has a normal mood and affect.    ED Course  Procedures (including critical care time)  Labs Reviewed  BASIC METABOLIC PANEL - Abnormal; Notable for the following:    Potassium 3.0 (*)    Glucose, Bld 170 (*)    GFR calc non Af Amer 80 (*)    All other components within normal limits  CBC  TROPONIN I  URINALYSIS, ROUTINE W REFLEX MICROSCOPIC   Dg Chest Portable 1 View  04/13/2013   *RADIOLOGY REPORT*  Clinical Data: Chest pain and vomiting.  PORTABLE CHEST - 1 VIEW  Comparison: 04/25/2007  Findings: Heart size and pulmonary vascularity are normal and the lungs are clear.  No acute osseous abnormality.  IMPRESSION: No acute disease in the chest.   Original Report Authenticated By: Francene Boyers, M.D.     1. Chest pain       MDM  Patients labs and/or radiological studies were viewed and considered during the medical decision making and disposition process.  Pt was also seen by Dr Manus Gunning.  Pt with increased risk factors for coronary disease.  Suspect possible unstable angina.  Spoke with Dr. Kerry Hough who will admit pt for pcp.   Date: 04/13/2013  Rate: 74  Rhythm: normal sinus rhythm  QRS Axis: normal  Intervals: QT prolonged  ST/T Wave abnormalities: nonspecific ST changes  Conduction Disutrbances:none  Narrative Interpretation:   Old EKG Reviewed:  changes noted         Burgess Amor, PA-C 04/13/13 1120

## 2013-04-13 NOTE — ED Notes (Signed)
Pt voices multiply complaints. Has been noncompliant with BPmed

## 2013-04-13 NOTE — ED Notes (Signed)
Pt reports left sided chest pain since last night and elevated BP.   Describes pain as heaviness and sharp pain under left breast area.  Reports last night the pain radiated to left arm but not presently.  Reports nausea and SOB associated with the heaviness.  Pt alert and oriented.

## 2013-04-13 NOTE — H&P (Signed)
[facility] History and Physical  YEHONATAN GRANDISON ZOX:[id number] DOB: 12-Jul-1966 DOA: 04/13/2013  Referring physician:  PCP: Isabella Stalling, MD  Specialists:   Chief Complaint: chest pain  HPI: DE JAWORSKI is a 47 y.o. male with past medical hx including HTN noncompliance with medications, DM, hyperlipidemia presents to ED cc chest pain. Pt reports last evening he felt "like my BP up " as he reports dizziness, nausea, diaphoresis. States BP 190/109. Associated symptoms include chest 'pressure and numbness of left hand. Denies palpitations or sob. Denies  Denies vomiting. States he did not feel much better this am but he went to work. Went to the RN at work and BP 190/100. He was driven to the ed. He reports not taking his BP meds because "he does not like to take medicine". In ED he was given NTG with relief. Trop neg and EKG with NSR with non-specific ST-T wave changes. Symptoms sudden persistent characterized as mild to moderate.TRH asked to admit   Review of Systems: The patient denies anorexia, fever, weight loss,, vision loss, decreased hearing, hoarseness, balance deficits, hemoptysis, abdominal pain, melena, hematochezia, severe indigestion/heartburn, hematuria, incontinence, genital sores, muscle weakness, suspicious skin lesions, transient blindness, difficulty walking, depression, unusual weight change, abnormal bleeding, enlarged lymph nodes, angioedema, and breast masses.    Past Medical History  Diagnosis Date  . GERD (gastroesophageal reflux disease)   . Hyperlipidemia   . Hypertension   . Low back pain   . Benign prostatic hypertrophy    Past Surgical History  Procedure Laterality Date  . A spinal surgery    . Shoulder arthroscopy    . Shoulder arthroscopy  05/21/2012    Procedure: ARTHROSCOPY SHOULDER;  Surgeon: [name], MD;  Location: [facility];  Service: Orthopedics;  Laterality: Right;  . Subacromial decompression  05/21/2012    Procedure:  SUBACROMIAL DECOMPRESSION;  Surgeon: [name], MD;  Location: [facility];  Service: Orthopedics;  Laterality: Right;   Social History:  reports that he has never smoked. He does not have any smokeless tobacco history on file. He reports that he does not drink alcohol or use illicit drugs. Lives with wife, employed as Geologist, engineering and coach Allergies  Allergen Reactions  . Demerol (Meperidine) Itching  . Lansoprazole Other (See Comments)    unknown    No family history on file. Mother deceased in 77s from MS. Father alive with kidney disease, DM. 5 siblings who medical hx + for DM, HTN, MS  Prior to Admission medications   Medication Sig Start Date End Date Taking? Authorizing Provider  atorvastatin (LIPITOR) 20 MG tablet Take 20 mg by mouth daily.     Yes Historical Provider, MD  hydrochlorothiazide 25 MG tablet Take 25 mg by mouth daily.    Yes Historical Provider, MD  HYDROcodone-acetaminophen (NORCO) 7.5-325 MG per tablet Take 1 tablet by mouth every 4 (four) hours as needed for pain. 03/01/13  Yes [name], MD  ibuprofen (ADVIL,MOTRIN) 800 MG tablet Take 800 mg by mouth every 8 (eight) hours as needed. 05/21/12  Yes [name], MD  lisinopril (PRINIVIL,ZESTRIL) 20 MG tablet Take 20 mg by mouth daily.   Yes Historical Provider, MD  metFORMIN (GLUCOPHAGE) 500 MG tablet Take 500 mg by mouth 2 (two) times daily with a meal.    Yes Historical Provider, MD  metoprolol (TOPROL XL) 50 MG 24 hr tablet Take 50 mg by mouth daily.    Yes Historical Provider, MD  nebivolol (BYSTOLIC)  5 MG tablet Take 5 mg by mouth daily.   Yes Historical Provider, MD  olmesartan (BENICAR) 20 MG tablet Take 20 mg by mouth daily.   Yes Historical Provider, MD   Physical Exam: Filed Vitals:   04/13/13 0944 04/13/13 1000 04/13/13 1019 04/13/13 1121  BP: 144/117 131/88 138/110 123/77  Pulse: 80  66   Temp:      TempSrc:      Resp:   18 16  Height:      Weight:      SpO2: 96%  98% 99%      General:  Obese alert irritable  Eyes: PERRL EOMI  ENT: ears clear, nose without drainage, mucus membranes mouth pink and moist  Neck: supple no JVD no lymphadenopathy  Cardiovascular: RRR no murmur no LE edema  Respiratory: normal effort BS clear bilaterally no wheeze no rhonchi  Abdomen: obese soft +BS non-tender to palpation  Skin: warm dry no rash/lesions  Musculoskeletal: no clubbing no cyanosis  Psychiatric: calm irritable  Neurologic: cranial nerve II-XII intact speech clear  Labs on Admission:  Basic Metabolic Panel:  Recent Labs Lab 04/13/13 0925  NA 143  K 3.0*  CL 105  CO2 27  GLUCOSE 170*  BUN 12  CREATININE 1.08  CALCIUM 8.8   Liver Function Tests: No results found for this basename: AST, ALT, ALKPHOS, BILITOT, PROT, ALBUMIN,  in the last 168 hours No results found for this basename: LIPASE, AMYLASE,  in the last 168 hours No results found for this basename: AMMONIA,  in the last 168 hours CBC:  Recent Labs Lab 04/13/13 0925  WBC 5.4  HGB 15.4  HCT 45.6  MCV 92.1  PLT 226   Cardiac Enzymes:  Recent Labs Lab 04/13/13 0925  TROPONINI <0.30    BNP (last 3 results) No results found for this basename: PROBNP,  in the last 8760 hours CBG: No results found for this basename: GLUCAP,  in the last 168 hours  Radiological Exams on Admission: Dg Chest Portable 1 View  04/13/2013   *RADIOLOGY REPORT*  Clinical Data: Chest pain and vomiting.  PORTABLE CHEST - 1 VIEW  Comparison: 04/25/2007  Findings: Heart size and pulmonary vascularity are normal and the lungs are clear.  No acute osseous abnormality.  IMPRESSION: No acute disease in the chest.   Original Report Authenticated By: Francene Boyers, M.D.    EKG: Independently reviewed. EKG with SR with ST -T changes  Assessment/Plan Principal Problem:   Chest pain: atypical. Will admit for observation. Risk factors include uncontrolled HTN, DM, hyperlipidemia, obesity. Will cycle CE,  repeat EKG in am. Will provide oxygen support and NTG and morphine as needed for pain. Will request cardiology consult. Likely will need stress test.  Active Problems:   DIABETES MELLITUS, TYPE II, UNCONTROLLED: will check hgA1c and use SSI for glycemic control. Carb modified diet    HYPERLIPIDEMIA: check lipid panel. Continue statin      HYPERTENSION: uncontrolled as pt not compliant with meds. Will resume anti hypertensive medications as recommended by cards.     GERD: stable continue PPI    BENIGN PROSTATIC HYPERTROPHY: stable at baseline    Hypokalemia: will replete and recheck.    cardiology  Code Status: full Family Communication: wife at bedside Disposition Plan: home when ready  Time spent: 60 minutes  Gwenyth Bender [facility] Pager [phone]  If 7PM-7AM, please contact night-coverage [email or web address] Password [facility] 04/13/2013, 1:27 PM   Attending note:  Patient seen and independently examined.  Above note reviewed and agree.  He has been admitted with chest pain.  Appreciate cardiology input.  Plans for stress test tomorrow noted.  He has been restarted on outpatient meds for HTN.  Per med rec, he is on 2 BB as well as both ACE and ARB.  We have restarted him on toprol and lisinopril. Hypokalemia will be corrected.  Dr. [name] will assume care of the patient at 7AM on 5/22.  Until then, [facility] will be available for any issues.  Jeison Delpilar

## 2013-04-13 NOTE — ED Provider Notes (Signed)
Medical screening examination/treatment/procedure(s) were conducted as a shared visit with non-physician practitioner(s) and myself.  I personally evaluated the patient during the encounter  Chest pressure with SOB and nausea, L arm numbness.  Noncompliant with BP meds. CTAB, RRR, no distress.  Glynn Octave, MD 04/13/13 1731

## 2013-04-13 NOTE — Consult Note (Signed)
Reason for Consult: Chest pain  Referring Physician: Kerry Hough  HPI: Geoffrey Jackson is a 47 year old male patient who was admitted with chest pain and elevated blood pressure who has a  history of hypertension, hyperlipidemia, diabetes mellitus, and obesity and sleep apnea and noncompliance. Cardiac enzymes are negative, potassium is low at 3.0 an EKG shows normal sinus rhythm with nonspecific ST-T wave changes.  Patient states last night he could tell his blood pressure was elevated and he became extremely nauseated. He then developed some chest heaviness and dull pressure. His blood pressure was 144/117 in the emergency room. He was given a sublingual nitroglycerin and now is pain-free and blood pressure has normalized. He was seen by cardiology back in 1999 and apparently had some type of stress test that was okay. The patient states he stopped taking his antihypertensive a while ago. 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 He is a nonsmoker. His father just had CABG his late 63s.  Past Medical History  Diagnosis Date  . GERD (gastroesophageal reflux disease)   . Hyperlipidemia   . Hypertension   . Low back pain   . Benign prostatic hypertrophy     Past Surgical History  Procedure Laterality Date  . A spinal surgery    . Shoulder arthroscopy    . Shoulder arthroscopy  05/21/2012    Procedure: ARTHROSCOPY SHOULDER;  Surgeon: [name], MD;  Location: [facility];  Service: Orthopedics;  Laterality: Right;  . Subacromial decompression  05/21/2012    Procedure: SUBACROMIAL DECOMPRESSION;  Surgeon: [name], MD;  Location: [facility];  Service: Orthopedics;  Laterality: Right;    Family history: Biological father just had CABG in his late 52s.  Social History:  reports that he has never smoked. He does not have any smokeless tobacco history on file. He reports that he does not drink alcohol or use illicit  drugs.  Allergies:  Allergies  Allergen Reactions  . Demerol (Meperidine) Itching  . Lansoprazole Other (See Comments)    unknown    Medications:  Scheduled Meds: . aspirin EC  325 mg Oral Daily  . enoxaparin (LOVENOX) injection  60 mg Subcutaneous Daily  . insulin aspart  0-9 Units Subcutaneous TID WC  . potassium chloride  40 mEq Oral Q4H  . sodium chloride  3 mL Intravenous Q12H  . [DISCONTINUED] sodium chloride   Intravenous STAT   Continuous Infusions: . sodium chloride     PRN Meds:.acetaminophen, acetaminophen, HYDROcodone-acetaminophen, morphine injection, nitroGLYCERIN, ondansetron (ZOFRAN) IV   Results for orders placed during the hospital encounter of 04/13/13 (from the past 48 hour(s))  CBC     Status: None   Collection Time    04/13/13  9:25 AM      Result Value Range   WBC 5.4  4.0 - 10.5 K/uL   RBC 4.95  4.22 - 5.81 MIL/uL   Hemoglobin 15.4  13.0 - 17.0 g/dL   HCT 40.9  81.1 - 91.4 %   MCV 92.1  78.0 - 100.0 fL   MCH 31.1  26.0 - 34.0 pg   MCHC 33.8  30.0 - 36.0 g/dL   RDW 78.2  95.6 - 21.3 %   Platelets 226  150 - 400 K/uL  TROPONIN I     Status: None   Collection Time    04/13/13  9:25 AM      Result Value Range   Troponin  I <0.30  <0.30 ng/mL   Comment:            Due to the release kinetics of cTnI,     a negative result within the first hours     of the onset of symptoms does not rule out     myocardial infarction with certainty.     If myocardial infarction is still suspected,     repeat the test at appropriate intervals.  BASIC METABOLIC PANEL     Status: Abnormal   Collection Time    04/13/13  9:25 AM      Result Value Range   Sodium 143  135 - 145 mEq/L   Potassium 3.0 (*) 3.5 - 5.1 mEq/L   Chloride 105  96 - 112 mEq/L   CO2 27  19 - 32 mEq/L   Glucose, Bld 170 (*) 70 - 99 mg/dL   BUN 12  6 - 23 mg/dL   Creatinine, Ser 1.61  0.50 - 1.35 mg/dL   Calcium 8.8  8.4 - 09.6 mg/dL   GFR calc non Af Amer 80 (*) >90 mL/min   GFR calc Af  Amer >90  >90 mL/min   Comment:            The eGFR has been calculated     using the CKD EPI equation.     This calculation has not been     validated in all clinical     situations.     eGFR's persistently     <90 mL/min signify     possible Chronic Kidney Disease.  URINALYSIS, ROUTINE W REFLEX MICROSCOPIC     Status: Abnormal   Collection Time    04/13/13 11:51 AM      Result Value Range   Color, Urine YELLOW  YELLOW   APPearance CLEAR  CLEAR   Specific Gravity, Urine 1.020  1.005 - 1.030   pH 6.0  5.0 - 8.0   Glucose, UA 100 (*) NEGATIVE mg/dL   Hgb urine dipstick TRACE (*) NEGATIVE   Bilirubin Urine NEGATIVE  NEGATIVE   Ketones, ur NEGATIVE  NEGATIVE mg/dL   Protein, ur NEGATIVE  NEGATIVE mg/dL   Urobilinogen, UA 0.2  0.0 - 1.0 mg/dL   Nitrite NEGATIVE  NEGATIVE   Leukocytes, UA NEGATIVE  NEGATIVE  URINE MICROSCOPIC-ADD ON     Status: None   Collection Time    04/13/13 11:51 AM      Result Value Range   RBC / HPF 0-2  <3 RBC/hpf    Dg Chest Portable 1 View  04/13/2013   *RADIOLOGY REPORT*  Clinical Data: Chest pain and vomiting.  PORTABLE CHEST - 1 VIEW  Comparison: 04/25/2007  Findings: Heart size and pulmonary vascularity are normal and the lungs are clear.  No acute osseous abnormality.  IMPRESSION: No acute disease in the chest.   Original Report Authenticated By: Francene Boyers, M.D.    ROS See HPI Eyes: Negative Ears:Negative for hearing loss, tinnitus Cardiovascular: Negative for palpitations,irregular heartbeat, dyspnea, dyspnea on exertion, near-syncope, orthopnea, paroxysmal nocturnal dyspnea and syncope,edema, claudication, cyanosis,.  Respiratory:   Negative for cough, hemoptysis, shortness of breath, sleep disturbances due to breathing, sputum production and wheezing.   Endocrine: Negative for cold intolerance and heat intolerance.  Hematologic/Lymphatic: Negative for adenopathy and bleeding problem. Does not bruise/bleed easily.  Musculoskeletal:  Negative.   Gastrointestinal: Negative for vomiting, reflux, abdominal pain, diarrhea, constipation.   Genitourinary: Negative for bladder incontinence, dysuria, flank pain, frequency, hematuria,  hesitancy, nocturia and urgency.  Neurological: Negative. Extreme amount of stress at home Allergic/Immunologic: Negative for environmental allergies.  Blood pressure 123/77, pulse 66, temperature 98.4 F (36.9 C), temperature source Oral, resp. rate 16, height 6\' 1"  (1.854 m), weight 280 lb (127.007 kg), SpO2 99.00%. Physical Exam PHYSICAL EXAM: Well-nournished, in no acute distress. Neck: No JVD, HJR, Bruit, or thyroid enlargement Lungs: No tachypnea, clear without wheezing, rales, or rhonchi Cardiovascular: RRR, PMI not displaced, positive S4, no murmur, bruit, thrill, or heave. Abdomen: BS normal. Soft without organomegaly, masses, lesions or tenderness. Extremities: without cyanosis, clubbing or edema. Good distal pulses bilateral SKin: Warm, no lesions or rashes  Musculoskeletal: No deformities Neuro: no focal signs   EKG shows normal sinus rhythm with nonspecific ST-T wave changes  Assessment/Plan: 1. Chest Pain:cardiac enzymes negative an EKG with nonspecific changes. We will schedule stress Myoview for the morning 2. Uncontrolled Hypertension secondary to noncompliance. Resume outpatient meds. 3. Obesity 4. Hyperlipidemia 5. Diabetes Mellitus, Type 2 6. Sleep Apnea  Jacolyn Reedy 04/13/2013, 12:31 PM    Attending note:  Patient seen and examined. Reviewed case with Ms. Geni Bers [facility]. Mr. Thrall presents with recent onset chest pain, also elevated blood pressure in the setting of medication noncompliance. He also reports significant psychosocial stressors related to family and his job. Additional risk factors reviewed above including type 2 diabetes mellitus and obstructive sleep apnea. He reports undergoing a stress test greater than 10 years ago that was normal. No recent interval  ischemic testing.  He is comfortable at the present time, blood pressure 137/74 and heart rate in the 60s in sinus rhythm. Lungs are clear, cardiac exam without gallop, no pitting edema. Initial lab work reassuring with troponin I level less than 0.30, LDL 106. ECG shows no acute ST segment changes.  Will proceed with an exercise Myoview tomorrow for ischemic evaluation. Further plans to follow.  Jonelle Sidle, M.D., F.A.C.C.

## 2013-04-13 NOTE — ED Notes (Signed)
Pt reports pain relieved after 3rd nitro

## 2013-04-14 ENCOUNTER — Observation Stay (HOSPITAL_COMMUNITY): Payer: BC Managed Care – PPO

## 2013-04-14 ENCOUNTER — Encounter (HOSPITAL_COMMUNITY): Payer: Self-pay

## 2013-04-14 DIAGNOSIS — R079 Chest pain, unspecified: Secondary | ICD-10-CM

## 2013-04-14 LAB — BASIC METABOLIC PANEL
CO2: 30 mEq/L (ref 19–32)
Calcium: 8.8 mg/dL (ref 8.4–10.5)
Creatinine, Ser: 1.27 mg/dL (ref 0.50–1.35)
GFR calc Af Amer: 76 mL/min — ABNORMAL LOW (ref >90)
GFR calc non Af Amer: 66 mL/min — ABNORMAL LOW (ref >90)
Sodium: 145 mEq/L (ref 135–145)

## 2013-04-14 LAB — CBC
MCV: 94.9 fL (ref 78.0–100.0)
Platelets: 222 10*3/uL (ref 150–400)
RDW: 13.5 % (ref 11.5–15.5)
WBC: 5.6 10*3/uL (ref 4.0–10.5)

## 2013-04-14 LAB — GLUCOSE, CAPILLARY: Glucose-Capillary: 139 mg/dL — ABNORMAL HIGH (ref 70–99)

## 2013-04-14 MED ORDER — ASPIRIN 325 MG PO TBEC: 325.0000 mg | DELAYED_RELEASE_TABLET | Freq: Every day | ORAL | Status: DC

## 2013-04-14 MED ORDER — TECHNETIUM TC 99M SESTAMIBI - CARDIOLITE
10.0000 | Freq: Once | INTRAVENOUS | Status: AC | PRN
  Administered 2013-04-14: 08:00:00 10 via INTRAVENOUS

## 2013-04-14 MED ORDER — TECHNETIUM TC 99M SESTAMIBI - CARDIOLITE
30.0000 | Freq: Once | INTRAVENOUS | Status: AC | PRN
  Administered 2013-04-14: 10:00:00 30 via INTRAVENOUS

## 2013-04-14 MED ORDER — AMLODIPINE BESYLATE 5 MG PO TABS: 5.0000 mg | ORAL_TABLET | Freq: Every day | ORAL | Status: DC

## 2013-04-14 MED ORDER — NITROGLYCERIN 0.4 MG SL SUBL: 0.4000 mg | SUBLINGUAL_TABLET | SUBLINGUAL | Status: DC | PRN

## 2013-04-14 MED ORDER — ATORVASTATIN CALCIUM 40 MG PO TABS: 40.0000 mg | ORAL_TABLET | Freq: Every day | ORAL | Status: DC

## 2013-04-14 MED ORDER — METOPROLOL TARTRATE 25 MG PO TABS: 25.0000 mg | ORAL_TABLET | Freq: Two times a day (BID) | ORAL | Status: DC

## 2013-04-14 MED ORDER — ONDANSETRON HCL 4 MG/2ML IJ SOLN: 4.0000 mg | Freq: Three times a day (TID) | INTRAMUSCULAR | Status: DC | PRN

## 2013-04-14 MED ORDER — SODIUM CHLORIDE 0.9 % IJ SOLN
INTRAMUSCULAR | Status: AC
  Filled 2013-04-14: qty 10

## 2013-04-14 MED ORDER — AMLODIPINE BESYLATE 5 MG PO TABS
5.0000 mg | ORAL_TABLET | Freq: Every day | ORAL | Status: DC
  Administered 2013-04-14: 5 mg via ORAL
  Filled 2013-04-14: qty 1

## 2013-04-14 MED ORDER — REGADENOSON 0.4 MG/5ML IV SOLN
INTRAVENOUS | Status: AC
  Filled 2013-04-14: qty 5

## 2013-04-14 NOTE — Progress Notes (Signed)
Patient d/c home with prescriptions and instructions. IV cath removed and intact. No pain/swelling at site.

## 2013-04-14 NOTE — Progress Notes (Addendum)
Stress Lab Nurses Notes - Geoffrey Jackson 04/14/2013 Reason for doing test: Chest Pain Type of test: Stress Cardiolite/ inpatient Room 222 Nurse performing test: Parke Poisson, RN Nuclear Medicine Tech: Lyndel Pleasure Echo Tech: Not Applicable MD performing test: R. Dietrich Pates Family MD: DonDeigo Test explained and consent signed: yes IV started: 20g jelco, IV in progress from floor and No redness or edema Symptoms: Fatigue in legs Treatment/Intervention: None Reason test stopped: fatigue After recovery IV was: No redness or edema and Saline Lock flushed Patient to return to Nuc. Med at :10:15 Patient discharged: Transported back to room 222 via wc Patient's Condition upon discharge was: stable Comments: During test peak BP 187/57 & HR 129.  Recovery BP 122/88 & HR 70.  Symptoms resolved in recovery. Erskine Speed T

## 2013-04-14 NOTE — Progress Notes (Signed)
Rosaria Ferries  47 y.o.  male  Subjective: Patient feels fine with no chest discomfort nor dyspnea since yesterday.  Allergy: Demerol and Lansoprazole  Objective: Vital signs in last 24 hours: Temp:  [97.8 F (36.6 C)-97.9 F (36.6 C)] 97.9 F (36.6 C) (05/22 0602) Pulse Rate:  [53-68] 68 (05/22 0602) Resp:  [16-20] 18 (05/22 0602) BP: (111-143)/(69-92) 111/85 mmHg (05/22 0602) SpO2:  [98 %] 98 % (05/22 0602)  127.007 kg (280 lb) Body mass index is 36.95 kg/(m^2).  Weight change:  Last BM Date: 04/12/13  Intake/Output from previous day: 05/21 0701 - 05/22 0700 In: 1294.2 [P.O.:480; I.V.:814.2] Out: 1 [Urine:1] Total I/O since admission:    General- Well developed; no acute distress; moderately overweight Neck- No JVD, no carotid bruits Lungs- clear lung fields; normal I:E ratio; surgical scar over the posterior thoracic spine Cardiovascular- normal PMI; normal S1 and S2 Abdomen- normal bowel sounds; soft and non-tender without masses or organomegaly Skin- Warm, no significant lesions Extremities- Nl distal pulses; no edema  Lab Results: Cardiac Markers:   Recent Labs  04/13/13 1546 04/13/13 2229  TROPONINI <0.30 <0.30   CBC:   Recent Labs  04/13/13 0925 04/14/13 0458  WBC 5.4 5.6  HGB 15.4 15.5  HCT 45.6 47.0  PLT 226 222   BMET:  Recent Labs  04/13/13 0925 04/14/13 0458  NA 143 145  K 3.0* 3.5  CL 105 107  CO2 27 30  GLUCOSE 170* 124*  BUN 12 13  CREATININE 1.08 1.27  CALCIUM 8.8 8.8  Lipid Panel     Component Value Date/Time   CHOL 156 04/13/2013 0959   TRIG 52 04/13/2013 0959   HDL 40 04/13/2013 0959   CHOLHDL 3.9 04/13/2013 0959   VLDL 10 04/13/2013 0959   LDLCALC 106* 04/13/2013 0959   EKG: Sinus bradycardia rate of 49 bpm; left atrial abnormality; minimal right ventricular conduction delay; leftward axis; T wave abnormality consistent with lateral ischemia or LVH.  Imaging Studies/Results: Dg Chest 04/13/2013   No acute disease in the  chest.  Imaging: Imaging results have been reviewed  Medications:  I have reviewed the patient's current medications. Scheduled: . aspirin EC  325 mg Oral Daily  . atorvastatin  20 mg Oral q1800  . insulin aspart  0-9 Units Subcutaneous TID WC  . lisinopril  20 mg Oral Daily  . metoprolol succinate  50 mg Oral Daily    Assessment/Plan: Chest pain: Myocardial infarction ruled out; stress nuclear study shows good exercise tolerance with no convincing evidence for ischemia or infarction. Left ventricular size is mildly increased and function at the lower limit of normal. Agree with plans for discharge today and cardiology followup within the next few weeks.  In the absence of known vascular disease, treatment with aspirin is not necessary.  Hypertension: Resting heart rate is slow, and we were barely able to achieve 75% of age-predicted maximum during exercise. Blood pressure response during stress test was mildly hypertensive. Patient has been treated with 2 beta blockers as an outpatient, metoprolol and nabivolol.  The former can be discontinued and amlodipine 5 mg per day started.  Hyperlipidemia: Lipid profile is good with current therapy. Since he is being treated with a statin, it would be worthwhile to achieve an even lower total and LDL cholesterol by increasing dose to 40 mg per day.  LOS: 1 day   Sterling Bing 04/14/2013, 11:30 AM

## 2013-04-14 NOTE — Progress Notes (Signed)
[id number] 

## 2013-04-14 NOTE — Progress Notes (Signed)
UR chart review completed.  

## 2013-04-14 NOTE — Care Management Note (Signed)
    Page 1 of 1   04/14/2013     1:18:34 PM   CARE MANAGEMENT NOTE 04/14/2013  Patient:  Geoffrey Jackson, Geoffrey Jackson   Account Number:  [id number]  Date Initiated:  04/14/2013  Documentation initiated by:  Sharrie Rothman  Subjective/Objective Assessment:   Pt admitted from home with CP. Pt lives with his wife and will return home at discharge. Pt is independent with ADL's.     Action/Plan:   Pt discharged home today. No CM needs noted.   Anticipated DC Date:  04/14/2013   Anticipated DC Plan:  HOME/SELF CARE      DC Planning Services  CM consult      Choice offered to / List presented to:             Status of service:  Completed, signed off Medicare Important Message given?   (If response is "NO", the following Medicare IM given date fields will be blank) Date Medicare IM given:   Date Additional Medicare IM given:    Discharge Disposition:  HOME/SELF CARE  Per UR Regulation:    If discussed at Long Length of Stay Meetings, dates discussed:    Comments:  04/14/13 1315 Arlyss Queen, RN BSN CM

## 2013-04-15 NOTE — Discharge Summary (Signed)
NAME:  DECLYN, DELSOL NO.:  [id number]  MEDICAL RECORD NO.:  [id number]  LOCATION:  A222                          FACILITY:  [facility]  PHYSICIAN:  Melvyn Novas, MDDATE OF BIRTH:  1966/07/09  DATE OF ADMISSION:  04/13/2013 DATE OF DISCHARGE:  05/22/2014LH                              DISCHARGE SUMMARY   The patient is a 47 year old black male with history of diabetes, hyperlipidemia, hypertension, who has chronic noncompliance with his medical regimen came in with retrosternal heaviness, was ruled out on the basis of myocardial infarction.  Troponins negative x2 and had a Cardiolite exercise evaluation which is reported as negative.  He is hemodynamically stable in the hospital.  He had mild hypokalemia of 3.0, was rectified with potassium chloride 40 mEq orally and stopped his hydrochlorothiazide.  The patient had no further complaints of chest pain and was subsequently discharged within 36 hours.  His discharge medicines included aspirin 325 mg p.o. daily, sublingual nitroglycerin 0.4 p.r.n., Norvasc 5 mg p.o. daily, Lipitor 40 mg p.o. daily, Glucophage 500 mg p.o. b.i.d., Toprol-XL 50 mg p.o. daily, lisinopril 20 mg p.o. daily, hydrocodone 7.5/325 q.i.d. p.r.n.  He will follow up in the office in 3 days' time for assessment of electrolytes and further investigation of chest pain if it continues.     Melvyn Novas, MD     RMD/MEDQ  D:  04/14/2013  T:  04/15/2013  Job:  [id number]

## 2013-04-15 NOTE — Progress Notes (Signed)
NAME:  Geoffrey Jackson, Geoffrey Jackson            ACCOUNT NO.:  [id number]  MEDICAL RECORD NO.:  [id number]  LOCATION:                                 FACILITY:  PHYSICIAN:  Melvyn Novas, MDDATE OF BIRTH:  12-22-1965  DATE OF PROCEDURE:  04/14/2013 DATE OF DISCHARGE:  04/14/2013                                PROGRESS NOTE   SUBJECTIVE:  The patient has chronic noncompliance, hypertension, type 2 diabetes, and hyperlipidemia was previously controlled on lisinopril 20, clonidine 0.1 p.o. b.i.d., and Norvasc 10, came in with some retrosternal pressure pain with multiple risk factors, was ruled out for myocardial infarction at base of 2- enzymes.  Had Cardiolite scan and treadmill exercise test this morning.  Unofficially, it is reported as negative, however, wait for official report.  Hemoglobin 15.5.  The patient is on diet control diabetes, was taken off of Lopressor due to weight loss, and appears to be hemodynamically stable.  OBJECTIVE:  VITAL SIGNS:  Blood pressure is 111/85, temperature 97.9, pulse 68 and regular, respiratory rate is 18, O2 saturation 98%. LUNGS:  Clear.  No rales, wheeze, or rhonchi appreciable HEART:  Unremarkable.  No S3 or S4.  No heaves, thrills, or rubs.  LABORATORY DATA:  Fasting blood sugar was 139, this morning, a.c. lunch glucoses 179.  ASSESSMENT/PLAN:  Right now is to get BMET in a.m.  Check potassium, he was given 40 mEq a day.  If results of exercise tolerance test are negative, we will discharge the patient on the following aspirin 81 mg p.o. daily, lisinopril 20 mg p.o. daily, Lopressor 25 mg p.o. b.i.d., Norvasc 5 mg p.o. daily, and Lipitor 40 mg p.o. daily.  He will follow up in the office in 1 week's time.     Melvyn Novas, MD     RMD/MEDQ  D:  04/14/2013  T:  04/15/2013  Job:  [id number]

## 2013-05-03 ENCOUNTER — Emergency Department (HOSPITAL_COMMUNITY)
Admission: EM | Admit: 2013-05-03 | Discharge: 2013-05-03 | Disposition: A | Discharge status: Home / Self Care | Payer: BC Managed Care – PPO | Attending: Emergency Medicine | Admitting: Emergency Medicine

## 2013-05-03 ENCOUNTER — Encounter (HOSPITAL_COMMUNITY): Payer: Self-pay | Admitting: *Deleted

## 2013-05-03 DIAGNOSIS — J0100 Acute maxillary sinusitis, unspecified: Secondary | ICD-10-CM | POA: Insufficient documentation

## 2013-05-03 DIAGNOSIS — Z87448 Personal history of other diseases of urinary system: Secondary | ICD-10-CM | POA: Insufficient documentation

## 2013-05-03 DIAGNOSIS — Z7982 Long term (current) use of aspirin: Secondary | ICD-10-CM | POA: Insufficient documentation

## 2013-05-03 DIAGNOSIS — E119 Type 2 diabetes mellitus without complications: Secondary | ICD-10-CM | POA: Insufficient documentation

## 2013-05-03 DIAGNOSIS — Z8719 Personal history of other diseases of the digestive system: Secondary | ICD-10-CM | POA: Insufficient documentation

## 2013-05-03 DIAGNOSIS — E785 Hyperlipidemia, unspecified: Secondary | ICD-10-CM | POA: Insufficient documentation

## 2013-05-03 DIAGNOSIS — J3489 Other specified disorders of nose and nasal sinuses: Secondary | ICD-10-CM | POA: Insufficient documentation

## 2013-05-03 DIAGNOSIS — Z79899 Other long term (current) drug therapy: Secondary | ICD-10-CM | POA: Insufficient documentation

## 2013-05-03 DIAGNOSIS — Z8739 Personal history of other diseases of the musculoskeletal system and connective tissue: Secondary | ICD-10-CM | POA: Insufficient documentation

## 2013-05-03 DIAGNOSIS — I10 Essential (primary) hypertension: Secondary | ICD-10-CM | POA: Insufficient documentation

## 2013-05-03 MED ORDER — OXYCODONE-ACETAMINOPHEN 5-325 MG PO TABS: 2.0000 | ORAL_TABLET | ORAL | Status: DC | PRN

## 2013-05-03 MED ORDER — CLINDAMYCIN HCL 150 MG PO CAPS: 150.0000 mg | ORAL_CAPSULE | Freq: Four times a day (QID) | ORAL | Status: DC

## 2013-05-03 MED ORDER — IBUPROFEN 800 MG PO TABS: 800.0000 mg | ORAL_TABLET | Freq: Once | ORAL | Status: DC

## 2013-05-03 MED ORDER — OXYCODONE-ACETAMINOPHEN 5-325 MG PO TABS
2.0000 | ORAL_TABLET | Freq: Once | ORAL | Status: AC
  Administered 2013-05-03: 2 via ORAL
  Filled 2013-05-03: qty 2

## 2013-05-03 NOTE — ED Notes (Signed)
Pt alert & oriented x4, stable gait. Patient given discharge instructions, paperwork & prescription(s). Patient  instructed to stop at the registration desk to finish any additional paperwork. Patient verbalized understanding. Pt left department w/ no further questions. 

## 2013-05-03 NOTE — ED Notes (Signed)
Facial pain at upper gums, behind nasal passages and behind eyes.  Pain greater on R.  No fever or chills.

## 2013-05-03 NOTE — ED Provider Notes (Signed)
History     CSN: [id number]  Arrival date & time 05/03/13  1622   First MD Initiated Contact with Patient 05/03/13 1657      Chief Complaint  Patient presents with  . Facial Pain    (Consider location/radiation/quality/duration/timing/severity/associated sxs/prior treatment) The history is provided by the patient.   patient here complaining of a two-day history of right-sided facial pain with nasal congestion and pain to his gums. Patient also notes discomfort over his right maxillary sinus as well. Denies any fever, chills, neck pain. Mild headache appreciated without emesis. No visual changes. Symptoms have been persistent and no medications used prior to arrival. No prior history of same.  Past Medical History  Diagnosis Date  . GERD (gastroesophageal reflux disease)   . Hyperlipidemia   . Hypertension   . Low back pain   . Benign prostatic hypertrophy   . Diabetes mellitus without complication     Past Surgical History  Procedure Laterality Date  . A spinal surgery    . Shoulder arthroscopy    . Shoulder arthroscopy  05/21/2012    Procedure: ARTHROSCOPY SHOULDER;  Surgeon: [name], MD;  Location: [facility];  Service: Orthopedics;  Laterality: Right;  . Subacromial decompression  05/21/2012    Procedure: SUBACROMIAL DECOMPRESSION;  Surgeon: [name], MD;  Location: [facility];  Service: Orthopedics;  Laterality: Right;    No family history on file.  History  Substance Use Topics  . Smoking status: Never Smoker   . Smokeless tobacco: Not on file  . Alcohol Use: No      Review of Systems  All other systems reviewed and are negative.    Allergies  Demerol and Lansoprazole  Home Medications   Current Outpatient Rx  Name  Route  Sig  Dispense  Refill  . amLODipine (NORVASC) 5 MG tablet   Oral   Take 1 tablet (5 mg total) by mouth daily.   30 tablet   3   . aspirin EC 325 MG EC tablet   Oral   Take 1 tablet (325 mg total) by mouth daily.   30 tablet   0   . atorvastatin (LIPITOR) 40 MG tablet   Oral   Take 1 tablet (40 mg total) by mouth daily at 6 PM.   30 tablet   3   . lisinopril (PRINIVIL,ZESTRIL) 20 MG tablet   Oral   Take 20 mg by mouth daily.         . metFORMIN (GLUCOPHAGE) 500 MG tablet   Oral   Take 500 mg by mouth 2 (two) times daily with a meal.          . metoprolol (TOPROL XL) 50 MG 24 hr tablet   Oral   Take 50 mg by mouth daily.          . ondansetron (ZOFRAN) 4 MG/2ML SOLN injection   Intravenous   Inject 2 mLs (4 mg total) into the vein every 8 (eight) hours as needed for nausea or vomiting.   2 mL   0   . nitroGLYCERIN (NITROSTAT) 0.4 MG SL tablet   Sublingual   Place 1 tablet (0.4 mg total) under the tongue every 5 (five) minutes as needed for chest pain.   30 tablet   12     BP 209/129  Pulse 86  Temp(Src) 98.2 F (36.8 C) (Oral)  Resp 17  Ht 6' (1.829 m)  Wt 275 lb (124.739 kg)  BMI 37.29 kg/m2  SpO2 99%  Physical Exam  Nursing note and vitals reviewed. Constitutional: He is oriented to person, place, and time. He appears well-developed and well-nourished.  Non-toxic appearance. No distress.  HENT:  Head: Normocephalic and atraumatic.    Eyes: Conjunctivae, EOM and lids are normal. Pupils are equal, round, and reactive to light.  Neck: Normal range of motion. Neck supple. No tracheal deviation present. No mass present.  Cardiovascular: Normal rate, regular rhythm and normal heart sounds.  Exam reveals no gallop.   No murmur heard. Pulmonary/Chest: Effort normal and breath sounds normal. No stridor. No respiratory distress. He has no decreased breath sounds. He has no wheezes. He has no rhonchi. He has no rales.  Abdominal: Soft. Normal appearance and bowel sounds are normal. He exhibits no distension. There is no tenderness. There is no rebound and no CVA tenderness.  Musculoskeletal: Normal range of motion. He exhibits no edema and no tenderness.  Neurological: He  is alert and oriented to person, place, and time. He has normal strength. No cranial nerve deficit or sensory deficit. GCS eye subscore is 4. GCS verbal subscore is 5. GCS motor subscore is 6.  Skin: Skin is warm and dry. No abrasion and no rash noted.  Psychiatric: He has a normal mood and affect. His speech is normal and behavior is normal.    ED Course  Procedures (including critical care time)  Labs Reviewed - No data to display No results found.   No diagnosis found.    MDM  Pt given pain meds here and he feels better--suspect sinusitis, will place on abx and d/c        Toy Baker, MD 05/03/13 [phone]

## 2013-05-06 ENCOUNTER — Emergency Department (HOSPITAL_COMMUNITY)
Admission: EM | Admit: 2013-05-06 | Discharge: 2013-05-06 | Disposition: A | Discharge status: Home / Self Care | Payer: BC Managed Care – PPO | Attending: Emergency Medicine | Admitting: Emergency Medicine

## 2013-05-06 ENCOUNTER — Emergency Department (HOSPITAL_COMMUNITY): Payer: BC Managed Care – PPO

## 2013-05-06 ENCOUNTER — Encounter (HOSPITAL_COMMUNITY): Payer: Self-pay | Admitting: *Deleted

## 2013-05-06 DIAGNOSIS — K029 Dental caries, unspecified: Secondary | ICD-10-CM | POA: Insufficient documentation

## 2013-05-06 DIAGNOSIS — E119 Type 2 diabetes mellitus without complications: Secondary | ICD-10-CM | POA: Insufficient documentation

## 2013-05-06 DIAGNOSIS — Z8739 Personal history of other diseases of the musculoskeletal system and connective tissue: Secondary | ICD-10-CM | POA: Insufficient documentation

## 2013-05-06 DIAGNOSIS — R51 Headache: Secondary | ICD-10-CM | POA: Insufficient documentation

## 2013-05-06 DIAGNOSIS — Z87448 Personal history of other diseases of urinary system: Secondary | ICD-10-CM | POA: Insufficient documentation

## 2013-05-06 DIAGNOSIS — Z79899 Other long term (current) drug therapy: Secondary | ICD-10-CM | POA: Insufficient documentation

## 2013-05-06 DIAGNOSIS — Z8719 Personal history of other diseases of the digestive system: Secondary | ICD-10-CM | POA: Insufficient documentation

## 2013-05-06 DIAGNOSIS — K047 Periapical abscess without sinus: Secondary | ICD-10-CM | POA: Insufficient documentation

## 2013-05-06 DIAGNOSIS — I10 Essential (primary) hypertension: Secondary | ICD-10-CM | POA: Insufficient documentation

## 2013-05-06 DIAGNOSIS — K219 Gastro-esophageal reflux disease without esophagitis: Secondary | ICD-10-CM | POA: Insufficient documentation

## 2013-05-06 DIAGNOSIS — J3489 Other specified disorders of nose and nasal sinuses: Secondary | ICD-10-CM | POA: Insufficient documentation

## 2013-05-06 DIAGNOSIS — Z7982 Long term (current) use of aspirin: Secondary | ICD-10-CM | POA: Insufficient documentation

## 2013-05-06 DIAGNOSIS — R197 Diarrhea, unspecified: Secondary | ICD-10-CM | POA: Insufficient documentation

## 2013-05-06 DIAGNOSIS — E785 Hyperlipidemia, unspecified: Secondary | ICD-10-CM | POA: Insufficient documentation

## 2013-05-06 MED ORDER — OXYCODONE-ACETAMINOPHEN 5-325 MG PO TABS: 1.0000 | ORAL_TABLET | ORAL | Status: DC | PRN

## 2013-05-06 MED ORDER — OXYCODONE-ACETAMINOPHEN 5-325 MG PO TABS
1.0000 | ORAL_TABLET | Freq: Once | ORAL | Status: AC
  Administered 2013-05-06: 1 via ORAL
  Filled 2013-05-06: qty 1

## 2013-05-06 NOTE — ED Notes (Signed)
Given Clindamycin for sinus infection x 3 days ago.  Started taking then, reporting facial swelling x 2 days and diarrhea. Denies difficulty breathing, denies throat swelling.

## 2013-05-06 NOTE — ED Notes (Addendum)
Taking clindamycin for sinus infection , feels his face is swollen , having diarrhea, - x2 today- and metallic taste in his mouth.  Feels there is swelling inside is nose. No resp distress.

## 2013-05-09 NOTE — ED Provider Notes (Signed)
History     CSN: [id number]  Arrival date & time 05/06/13  1844   First MD Initiated Contact with Patient 05/06/13 1946      Chief Complaint  Patient presents with  . Facial Swelling    (Consider location/radiation/quality/duration/timing/severity/associated sxs/prior treatment) HPI Comments: Geoffrey Jackson is a 47 y.o. Male presenting for for further evaluation of right-sided facial pain as he has now developed swelling as well.  He was seen here 3 days ago and was prescribed clindamycin for a suspected sinus infection which he continues to take, but is concerned because he is now developed localized pain and swelling at his right nasal fold which is very tender.  He denies dental problems or pain, although notes a chronically discolored right upper central incisor but has had no gingival swelling, drainage or dental abscesses or dental trauma.  He does report having a metallic taste in his mouth since starting the clindamycin and has also had loose stool, stating he had 2 soft bowel movements today.  He denies nausea and emesis.  Has also had no fevers or chills.  He was prescribed oxycodone at his previous visit which was helpful for his pain but has run out of this medication.   The history is provided by the patient.    Past Medical History  Diagnosis Date  . GERD (gastroesophageal reflux disease)   . Hyperlipidemia   . Hypertension   . Low back pain   . Benign prostatic hypertrophy   . Diabetes mellitus without complication     Past Surgical History  Procedure Laterality Date  . A spinal surgery    . Shoulder arthroscopy    . Shoulder arthroscopy  05/21/2012    Procedure: ARTHROSCOPY SHOULDER;  Surgeon: [name], MD;  Location: [facility];  Service: Orthopedics;  Laterality: Right;  . Subacromial decompression  05/21/2012    Procedure: SUBACROMIAL DECOMPRESSION;  Surgeon: [name], MD;  Location: [facility];  Service: Orthopedics;  Laterality: Right;    No  family history on file.  History  Substance Use Topics  . Smoking status: Never Smoker   . Smokeless tobacco: Not on file  . Alcohol Use: No      Review of Systems  Constitutional: Negative for fever.  HENT: Positive for facial swelling and sinus pressure. Negative for hearing loss, congestion, sore throat, rhinorrhea, neck pain, neck stiffness, dental problem, postnasal drip and tinnitus.   Eyes: Negative.   Respiratory: Negative for chest tightness and shortness of breath.   Cardiovascular: Negative for chest pain.  Gastrointestinal: Negative for nausea and abdominal pain.  Genitourinary: Negative.   Musculoskeletal: Negative for joint swelling and arthralgias.  Skin: Negative.  Negative for rash and wound.  Neurological: Negative for dizziness, weakness, light-headedness, numbness and headaches.  Psychiatric/Behavioral: Negative.     Allergies  Demerol and Lansoprazole  Home Medications   Current Outpatient Rx  Name  Route  Sig  Dispense  Refill  . amLODipine (NORVASC) 5 MG tablet   Oral   Take 1 tablet (5 mg total) by mouth daily.   30 tablet   3   . aspirin EC 325 MG EC tablet   Oral   Take 1 tablet (325 mg total) by mouth daily.   30 tablet   0   . atorvastatin (LIPITOR) 40 MG tablet   Oral   Take 1 tablet (40 mg total) by mouth daily at 6 PM.   30 tablet   3   .  clindamycin (CLEOCIN) 150 MG capsule   Oral   Take 1 capsule (150 mg total) by mouth every 6 (six) hours.   40 capsule   0   . lisinopril (PRINIVIL,ZESTRIL) 20 MG tablet   Oral   Take 20 mg by mouth daily.         . metFORMIN (GLUCOPHAGE) 500 MG tablet   Oral   Take 500 mg by mouth 2 (two) times daily with a meal.          . metoprolol (TOPROL XL) 50 MG 24 hr tablet   Oral   Take 50 mg by mouth daily.          . nitroGLYCERIN (NITROSTAT) 0.4 MG SL tablet   Sublingual   Place 1 tablet (0.4 mg total) under the tongue every 5 (five) minutes as needed for chest pain.   30  tablet   12   . ondansetron (ZOFRAN) 4 MG/2ML SOLN injection   Intravenous   Inject 2 mLs (4 mg total) into the vein every 8 (eight) hours as needed for nausea or vomiting.   2 mL   0   . oxyCODONE-acetaminophen (PERCOCET/ROXICET) 5-325 MG per tablet   Oral   Take 2 tablets by mouth every 4 (four) hours as needed for pain.   10 tablet   0   . oxyCODONE-acetaminophen (PERCOCET/ROXICET) 5-325 MG per tablet   Oral   Take 1 tablet by mouth every 4 (four) hours as needed for pain.   20 tablet   0     BP 157/95  Pulse 85  Temp(Src) 98.4 F (36.9 C) (Oral)  Resp 18  Ht 6\' 1"  (1.854 m)  Wt 275 lb (124.739 kg)  BMI 36.29 kg/m2  SpO2 100%  Physical Exam  Constitutional: He is oriented to person, place, and time. He appears well-developed and well-nourished. No distress.  HENT:  Head: Normocephalic and atraumatic.  Right Ear: Tympanic membrane and external ear normal.  Left Ear: Tympanic membrane and external ear normal.  Nose: Sinus tenderness present.    Mouth/Throat: Oropharynx is clear and moist and mucous membranes are normal. No oral lesions. Abnormal dentition. No dental abscesses, edematous or dental caries.  Tenderness and induration along right lateral nasal fold.  Tenderness tracks across his right maxilla, there is no appreciable edema or erythema except at the nasal fold.  He is healthy-appearing dentition except for right upper central incisor which is discolored, no gingival edema, drainage or obvious dental abscess.  Eyes: Conjunctivae are normal.  Neck: Normal range of motion. Neck supple.  Cardiovascular: Normal rate and normal heart sounds.   Pulmonary/Chest: Effort normal.  Abdominal: He exhibits no distension.  Musculoskeletal: Normal range of motion.  Lymphadenopathy:    He has no cervical adenopathy.  Neurological: He is alert and oriented to person, place, and time.  Skin: Skin is warm and dry. No erythema.  Psychiatric: He has a normal mood and  affect.    ED Course  Procedures (including critical care time)  Labs Reviewed - No data to display No results found.   1. Dental abscess       MDM  CT scan was reviewed and discussed with patient.  He was encouraged to continue taking his clindamycin, also suggested eating active yeast culture yogurt to  help prevent worsened diarrhea.  Encouraged warm compresses to face, prescribed oxycodone for continued pain relief.  He does have a dentist and will call on Monday for an appointment.  Patient was  discussed with Dr. Fonnie Jarvis who agrees with continuation of clindamycin.  The patient appears reasonably screened and/or stabilized for discharge and I doubt any other medical condition or other [facility] requiring further screening, evaluation, or treatment in the ED at this time prior to discharge.   Burgess Amor, PA-C 05/09/13 2129

## 2013-05-11 ENCOUNTER — Telehealth: Payer: Self-pay | Admitting: *Deleted

## 2013-05-11 NOTE — Telephone Encounter (Signed)
Reviewed pt Myoview results and noted Dr RR notations as follows:  Assessment/Plan:  Chest pain: Myocardial infarction ruled out; stress nuclear study shows good exercise tolerance with no convincing evidence for ischemia or infarction. Left ventricular size is mildly increased and function at the lower limit of normal. Agree with plans for discharge today and cardiology followup within the next few weeks. In the absence of known vascular disease, treatment with aspirin is not necessary.  Called pt to advise that he has an apt scheduled for DR RR on 05-13-13, pt advised he can not make this apt per starting a new job and will have to call back to the office when he gets his schedule straight, pt stated "I just thought this part was over" this nurse advised Dr Macarthur Critchley notations and that another nurse has been trying to contact him per notations made on result note as follows:  Notes Recorded by Charna Elizabeth, LPN on 11/29/1094 at 12:14 PM Patient called no answer.Left message on personal voice mail Dr.Rothbart reviewed results in hospital need to follow up with PCP. ------  Notes Recorded by Charna Elizabeth, LPN on 0/45/4098 at 9:51 AM Patient called no answer.[facility] ------  Notes Recorded by Charna Elizabeth, LPN on 12/12/1476 at 9:08 AM Patient called no answer.[facility]. ------  Notes Recorded by Dyann Kief, PA-C on 04/20/2013 at 8:07 AM Dr. Dietrich Pates already reviewed in hospital.Follow up with MD.  Pt understood and will contact to make a f/u apt asap, cancelled apt made for 05-13-13

## 2013-05-13 ENCOUNTER — Encounter: Payer: BC Managed Care – PPO | Admitting: Cardiology

## 2013-05-16 NOTE — ED Provider Notes (Signed)
Medical screening examination/treatment/procedure(s) were performed by non-physician practitioner and as supervising physician I was immediately available for consultation/collaboration.  Hurman Horn, MD 05/16/13 2251

## 2013-05-19 NOTE — ED Provider Notes (Signed)
Medical screening examination/treatment/procedure(s) were performed by non-physician practitioner and as supervising physician I was immediately available for consultation/collaboration.   Lakin Romer M Jaleen Finch, MD 05/19/13 1651 

## 2013-06-01 ENCOUNTER — Telehealth: Payer: Self-pay | Admitting: Orthopedic Surgery

## 2013-06-01 NOTE — Telephone Encounter (Signed)
The patient will need to have his MRI resubmitted for approval and after we get his MRI and then I'll set up an appointment for him

## 2013-06-01 NOTE — Telephone Encounter (Signed)
Routing Dr. Mort Sawyers note to [facility] and to Dr Romeo Apple - this MRI had gone into peer to peer/physician to physician review.  Can we ask patient to contact insurer regarding appeal process?  He would have received a letter at time of non-approval.

## 2013-06-01 NOTE — Telephone Encounter (Signed)
Spoke with patient today; he is asking when he is to have a follow up appointment regarding shoulder.  His last visit here was 03/01/13.  The MRI that was recommended at that time was not approved by insurance.  Please advise regarding follow up.  His home ph# is [phone].

## 2013-06-13 ENCOUNTER — Other Ambulatory Visit: Payer: Self-pay | Admitting: *Deleted

## 2013-06-13 DIAGNOSIS — G8929 Other chronic pain: Secondary | ICD-10-CM

## 2013-06-13 DIAGNOSIS — M25511 Pain in right shoulder: Secondary | ICD-10-CM

## 2013-06-13 MED ORDER — HYDROCODONE-ACETAMINOPHEN 7.5-325 MG PO TABS: 1.0000 | ORAL_TABLET | ORAL | Status: DC | PRN

## 2013-06-14 ENCOUNTER — Telehealth: Payer: Self-pay | Admitting: Orthopedic Surgery

## 2013-06-16 ENCOUNTER — Other Ambulatory Visit: Payer: Self-pay | Admitting: *Deleted

## 2013-06-16 DIAGNOSIS — M75101 Unspecified rotator cuff tear or rupture of right shoulder, not specified as traumatic: Secondary | ICD-10-CM

## 2013-06-16 MED ORDER — IBUPROFEN 800 MG PO TABS: 800.0000 mg | ORAL_TABLET | Freq: Three times a day (TID) | ORAL | Status: DC | PRN

## 2013-06-16 NOTE — Telephone Encounter (Signed)
Refilled Ibuprofen 800 mg

## 2013-07-18 ENCOUNTER — Other Ambulatory Visit: Payer: Self-pay | Admitting: Orthopedic Surgery

## 2013-07-18 DIAGNOSIS — M25521 Pain in right elbow: Secondary | ICD-10-CM

## 2013-08-18 ENCOUNTER — Other Ambulatory Visit: Payer: Self-pay | Admitting: Orthopedic Surgery

## 2013-08-18 DIAGNOSIS — G894 Chronic pain syndrome: Secondary | ICD-10-CM

## 2013-08-18 MED ORDER — HYDROCODONE-ACETAMINOPHEN 10-325 MG PO TABS: 1.0000 | ORAL_TABLET | ORAL | Status: DC | PRN

## 2013-09-06 ENCOUNTER — Telehealth: Payer: Self-pay | Admitting: Orthopedic Surgery

## 2013-09-06 NOTE — Telephone Encounter (Signed)
Patient has stopped in office today,09/06/13; requests to speak with Dr. Romeo Apple; states has done some physical therapy as recommended; said has not heard back from Dr. Romeo Apple in regard to the therapy report.  Patient's report from [facility] [place], is scanned into chart;  Indicates 1 Therapy visit, done on 08/18/13, with recommendation 2x per week for 4 weeks.  No other visits have been done so far, per facility (I called and spoke with Erie Noe). Patient wishes to wait today, if possible, to speak with Dr. Romeo Apple.  Copy of therapy note is printed for Dr to review if needed.  Patient's ph #[phone]

## 2013-09-27 ENCOUNTER — Other Ambulatory Visit: Payer: Self-pay | Admitting: *Deleted

## 2013-09-27 ENCOUNTER — Telehealth: Payer: Self-pay | Admitting: Orthopedic Surgery

## 2013-09-27 DIAGNOSIS — M25511 Pain in right shoulder: Secondary | ICD-10-CM

## 2013-09-27 MED ORDER — HYDROCODONE-ACETAMINOPHEN 10-325 MG PO TABS: 1.0000 | ORAL_TABLET | ORAL | Status: DC | PRN

## 2013-09-27 NOTE — Telephone Encounter (Signed)
Refilled medication and Geoffrey Jackson is working on MRI

## 2013-09-27 NOTE — Telephone Encounter (Signed)
Received call from patient, has question about (1) MRI - asking if it has been scheduled - please advise patient.  + (2) asking about medication refill - Hydrocodone/Norco 10/325 - please advise patient. [facility] # [phone]

## 2013-09-27 NOTE — Telephone Encounter (Signed)
Called back to patient at [facility] [phone], notified per Tammy Long, LPN, prescription is ready for pick-up, and also that MRI authorization is in progress.

## 2013-09-27 NOTE — Telephone Encounter (Signed)
Is it due yes refill it  No don't refill it  Geoffrey Jackson order mri right shoulder biceps tendon injury

## 2013-09-27 NOTE — Telephone Encounter (Signed)
Routing to Dr. Romeo Apple to advise about medication

## 2013-10-05 ENCOUNTER — Encounter (HOSPITAL_COMMUNITY): Payer: Self-pay | Admitting: Emergency Medicine

## 2013-10-05 ENCOUNTER — Emergency Department (HOSPITAL_COMMUNITY)
Admission: EM | Admit: 2013-10-05 | Discharge: 2013-10-05 | Disposition: A | Discharge status: Home / Self Care | Payer: Worker's Compensation | Attending: Emergency Medicine | Admitting: Emergency Medicine

## 2013-10-05 DIAGNOSIS — R42 Dizziness and giddiness: Secondary | ICD-10-CM | POA: Insufficient documentation

## 2013-10-05 DIAGNOSIS — I10 Essential (primary) hypertension: Secondary | ICD-10-CM | POA: Insufficient documentation

## 2013-10-05 DIAGNOSIS — E119 Type 2 diabetes mellitus without complications: Secondary | ICD-10-CM | POA: Insufficient documentation

## 2013-10-05 DIAGNOSIS — Z7982 Long term (current) use of aspirin: Secondary | ICD-10-CM | POA: Insufficient documentation

## 2013-10-05 DIAGNOSIS — T40904A Poisoning by unspecified psychodysleptics [hallucinogens], undetermined, initial encounter: Secondary | ICD-10-CM | POA: Insufficient documentation

## 2013-10-05 DIAGNOSIS — Z87448 Personal history of other diseases of urinary system: Secondary | ICD-10-CM | POA: Insufficient documentation

## 2013-10-05 DIAGNOSIS — Y9389 Activity, other specified: Secondary | ICD-10-CM | POA: Insufficient documentation

## 2013-10-05 DIAGNOSIS — Z8719 Personal history of other diseases of the digestive system: Secondary | ICD-10-CM | POA: Insufficient documentation

## 2013-10-05 DIAGNOSIS — R51 Headache: Secondary | ICD-10-CM | POA: Insufficient documentation

## 2013-10-05 DIAGNOSIS — T40901A Poisoning by unspecified psychodysleptics [hallucinogens], accidental (unintentional), initial encounter: Secondary | ICD-10-CM | POA: Insufficient documentation

## 2013-10-05 DIAGNOSIS — Z79899 Other long term (current) drug therapy: Secondary | ICD-10-CM | POA: Insufficient documentation

## 2013-10-05 DIAGNOSIS — Y9229 Other specified public building as the place of occurrence of the external cause: Secondary | ICD-10-CM | POA: Insufficient documentation

## 2013-10-05 DIAGNOSIS — E785 Hyperlipidemia, unspecified: Secondary | ICD-10-CM | POA: Insufficient documentation

## 2013-10-05 DIAGNOSIS — Z8739 Personal history of other diseases of the musculoskeletal system and connective tissue: Secondary | ICD-10-CM | POA: Insufficient documentation

## 2013-10-05 LAB — COMPREHENSIVE METABOLIC PANEL
AST: 23 U/L (ref 0–37)
Albumin: 3.7 g/dL (ref 3.5–5.2)
BUN: 12 mg/dL (ref 6–23)
CO2: 26 mEq/L (ref 19–32)
Chloride: 106 mEq/L (ref 96–112)
Creatinine, Ser: 1.04 mg/dL (ref 0.50–1.35)
GFR calc Af Amer: >90 mL/min (ref >90)
Glucose, Bld: 288 mg/dL — ABNORMAL HIGH (ref 70–99)
Potassium: 3.3 mEq/L — ABNORMAL LOW (ref 3.5–5.1)
Total Bilirubin: 0.4 mg/dL (ref 0.3–1.2)
Total Protein: 7.2 g/dL (ref 6.0–8.3)

## 2013-10-05 LAB — CBC WITH DIFFERENTIAL/PLATELET
Basophils Absolute: 0 10*3/uL (ref 0.0–0.1)
Eosinophils Relative: 0 % (ref 0–5)
HCT: 42.5 % (ref 39.0–52.0)
Lymphocytes Relative: 36 % (ref 12–46)
Lymphs Abs: 1.4 10*3/uL (ref 0.7–4.0)
MCV: 91.8 fL (ref 78.0–100.0)
Monocytes Absolute: 0.3 10*3/uL (ref 0.1–1.0)
Neutro Abs: 2.1 10*3/uL (ref 1.7–7.7)
Platelets: 207 10*3/uL (ref 150–400)
RBC: 4.63 MIL/uL (ref 4.22–5.81)
RDW: 13.4 % (ref 11.5–15.5)
WBC: 3.8 10*3/uL — ABNORMAL LOW (ref 4.0–10.5)

## 2013-10-05 LAB — RAPID URINE DRUG SCREEN, HOSP PERFORMED
Amphetamines: NOT DETECTED
Benzodiazepines: NOT DETECTED
Cocaine: NOT DETECTED
Opiates: NOT DETECTED
Tetrahydrocannabinol: POSITIVE — AB

## 2013-10-05 LAB — URINALYSIS, ROUTINE W REFLEX MICROSCOPIC
Bilirubin Urine: NEGATIVE
Glucose, UA: >1000 mg/dL — AB
Leukocytes, UA: NEGATIVE
pH: 6 (ref 5.0–8.0)

## 2013-10-05 MED ORDER — ONDANSETRON HCL 4 MG PO TABS: 4.0000 mg | ORAL_TABLET | Freq: Four times a day (QID) | ORAL | Status: DC

## 2013-10-05 MED ORDER — ONDANSETRON 8 MG PO TBDP
8.0000 mg | ORAL_TABLET | Freq: Once | ORAL | Status: AC
  Administered 2013-10-05: 8 mg via ORAL
  Filled 2013-10-05: qty 1
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
## 2013-10-25 ENCOUNTER — Ambulatory Visit (INDEPENDENT_AMBULATORY_CARE_PROVIDER_SITE_OTHER): Payer: BC Managed Care – PPO | Admitting: Orthopedic Surgery

## 2013-10-25 ENCOUNTER — Ambulatory Visit: Payer: Self-pay | Admitting: Orthopedic Surgery

## 2013-10-25 VITALS — BP 184/116 | Ht 73.0 in | Wt 268.0 lb

## 2013-10-25 DIAGNOSIS — S46211D Strain of muscle, fascia and tendon of other parts of biceps, right arm, subsequent encounter: Secondary | ICD-10-CM

## 2013-10-25 DIAGNOSIS — S46219A Strain of muscle, fascia and tendon of other parts of biceps, unspecified arm, initial encounter: Secondary | ICD-10-CM | POA: Insufficient documentation

## 2013-10-25 DIAGNOSIS — Z5189 Encounter for other specified aftercare: Secondary | ICD-10-CM

## 2013-10-25 DIAGNOSIS — M25511 Pain in right shoulder: Secondary | ICD-10-CM

## 2013-10-25 DIAGNOSIS — M25519 Pain in unspecified shoulder: Secondary | ICD-10-CM

## 2013-10-25 MED ORDER — HYDROCODONE-ACETAMINOPHEN 10-325 MG PO TABS: 1.0000 | ORAL_TABLET | ORAL | Status: DC | PRN

## 2013-10-25 NOTE — Progress Notes (Signed)
Patient ID: Geoffrey Jackson, male   DOB: 1966/10/28, 47 y.o.   MRN: [id number]  Chief Complaint  Patient presents with  . Follow-up    Right shoulder    The patient did complain of pain over the right biceps tendon and muscle. He complains of fatigue over the muscle.  He had right shoulder arthroscopy rotator cuff repair biceps tenotomy. This was his second shoulder surgery for pain the first one was more for instability-like symptoms.  At this point he has failed physical therapy ibuprofen and Norco 10 mg he's had greater than 6 weeks of treatment he's had 2 injections one in the shoulder when the biceps tendon he has not improved he has been back to me after therapy failed and now she didn't have an MRI to evaluate his biceps tenotomy for possible biceps tenodesis

## 2013-11-03 ENCOUNTER — Telehealth: Payer: Self-pay | Admitting: Orthopedic Surgery

## 2013-11-03 NOTE — Telephone Encounter (Signed)
As of 10/31/13, [employer] was contacted to request authorization for MRI of right shoulder,biceps tendon, without contrast,CPT 73221.  Previously, this service had been denied, at which time patient was contacted by letter from insurer- he had corresponded back to insurer at that time.  Insurer requested clinicals to be faxed - this was done on 10/31/13.  On 11/01/13, a faxed letter was received from [employer]'s "[employer]" Pre-authorization department, with Authorization: [id number].  Call was made back to this department, authorization was confirmed, and "good for 30 days."  MRI scheduled at [facility] on 11/07/13, 6:00pm, 5:30pm registration.  Patient is to follow up here for results - he was called and notified. On 11/03/13, call was received from [facility], Morrie Sheldon, ph# [phone], stating that the authorization just received, shows "expired" 10/27/13.    - I called back to [facility] and also viewed status of MRI authorization online.  Per Luretha Rued, advised to directly speak with [facility] "Appeals", and provide this authorization #, and request further advice.  Voice message was left.  Patient aware of status.

## 2013-11-04 NOTE — Telephone Encounter (Signed)
Received call back from Abran Duke, [employer] appeals nurse at [facility], who verified that the MRI is in fact approved, under the Authorization # [id number], valid for 30 days, from 11/01/13 through 11/30/13 .  She states this is correct, per letter received as noted.  If any question  - facility may contact her direct ph# [phone].

## 2013-11-07 ENCOUNTER — Ambulatory Visit (HOSPITAL_COMMUNITY)
Admission: RE | Admit: 2013-11-07 | Discharge: 2013-11-07 | Disposition: A | Discharge status: Home / Self Care | Payer: BC Managed Care – PPO | Source: Ambulatory Visit | Attending: Orthopedic Surgery | Admitting: Orthopedic Surgery

## 2013-11-07 DIAGNOSIS — S46211D Strain of muscle, fascia and tendon of other parts of biceps, right arm, subsequent encounter: Secondary | ICD-10-CM

## 2013-11-08 ENCOUNTER — Ambulatory Visit: Payer: Self-pay | Admitting: Orthopedic Surgery

## 2013-11-08 NOTE — Telephone Encounter (Signed)
11/08/13 - Patient called to relay that MRI, scheduled for last evening, 11/07/13, [facility], was unable to be done; states he was "too big for machine."   Option: try to re-schedule at [facility].  Called [employer] direct party for authorization - left voice message to call back to advise if authorization valid at this facility.

## 2013-11-14 NOTE — Telephone Encounter (Signed)
11/14/13 - MRI has been re-scheduled at [facility], per [facility], ph# [phone], for open MRI, with same authorization, for:  date of 11/25/13, 8:30p.m, registration 8:00p.m.  Patient to follow up here for results.  He has been notified of appointment.  Authorization # [id number], valid for 30 days, from 11/01/13 through 11/30/13 . This was verified per Cicero Duck T, [employer] ([employer]'s utilization management) states this is correct, per letter received as noted. If any question - facility may contact her direct ph# [phone].

## 2013-11-22 ENCOUNTER — Ambulatory Visit: Payer: BC Managed Care – PPO | Admitting: Orthopedic Surgery

## 2013-11-25 ENCOUNTER — Ambulatory Visit: Admission: RE | Admit: 2013-11-25 | Payer: BC Managed Care – PPO | Source: Ambulatory Visit

## 2013-11-25 ENCOUNTER — Other Ambulatory Visit: Payer: Self-pay

## 2013-11-25 ENCOUNTER — Other Ambulatory Visit: Payer: Self-pay | Admitting: Orthopedic Surgery

## 2013-11-25 ENCOUNTER — Ambulatory Visit
Admission: RE | Admit: 2013-11-25 | Discharge: 2013-11-25 | Disposition: A | Discharge status: Home / Self Care | Payer: BC Managed Care – PPO | Source: Ambulatory Visit | Attending: Orthopedic Surgery | Admitting: Orthopedic Surgery

## 2013-11-25 DIAGNOSIS — S46211D Strain of muscle, fascia and tendon of other parts of biceps, right arm, subsequent encounter: Secondary | ICD-10-CM

## 2013-11-29 ENCOUNTER — Encounter: Payer: Self-pay | Admitting: Orthopedic Surgery

## 2013-11-29 ENCOUNTER — Ambulatory Visit (INDEPENDENT_AMBULATORY_CARE_PROVIDER_SITE_OTHER): Payer: BC Managed Care – PPO | Admitting: Orthopedic Surgery

## 2013-11-29 VITALS — Ht 73.0 in | Wt 268.0 lb

## 2013-11-29 DIAGNOSIS — M25519 Pain in unspecified shoulder: Secondary | ICD-10-CM

## 2013-11-29 DIAGNOSIS — M25511 Pain in right shoulder: Secondary | ICD-10-CM

## 2013-11-29 MED ORDER — HYDROCODONE-ACETAMINOPHEN 10-325 MG PO TABS: 1.0000 | ORAL_TABLET | ORAL | Status: DC | PRN

## 2013-11-30 ENCOUNTER — Other Ambulatory Visit: Payer: Self-pay | Admitting: *Deleted

## 2013-11-30 DIAGNOSIS — M62838 Other muscle spasm: Secondary | ICD-10-CM

## 2013-11-30 MED ORDER — CYCLOBENZAPRINE HCL 10 MG PO TABS: 10.0000 mg | ORAL_TABLET | Freq: Three times a day (TID) | ORAL | Status: DC | PRN

## 2013-11-30 NOTE — Progress Notes (Signed)
Patient ID: Geoffrey FerriesStephen D Emerick, male   DOB: [birth year], 48 y.o.   MRN: [id number] Chief Complaint  Patient presents with  . 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 There is some fluid in the biceps tendon sheath which may indicate inflammation  MR report has been reviewed.  The patient's pain continues to be in the anterior shoulder and biceps tendon area in the bicipital groove. There may be disease tendon still in the sheath.  I've discussed with him treatment options and he will need to discuss this with his employer as he will need some time out of work  Review of systems the patient also is complaining of back problems status post back surgery by Dr. Danielle DessElsner and will be referred to him if any problems or rise from this area  Ht 6\' 1"  (1.854 m)  Wt 268 lb (121.564 kg)  BMI 35.37 kg/m2 General appearance is normal, the patient is alert and oriented x3 with normal mood and affect. Tenderness in the bicipital groove Painful for elevation and abduction  A.c. joint arthrosis Biceps tendinitis  Surgery at the patient's convenience

## 2013-12-06 ENCOUNTER — Emergency Department (HOSPITAL_COMMUNITY)
Admission: EM | Admit: 2013-12-06 | Discharge: 2013-12-06 | Disposition: A | Discharge status: Home / Self Care | Payer: BC Managed Care – PPO | Attending: Emergency Medicine | Admitting: Emergency Medicine

## 2013-12-06 ENCOUNTER — Encounter (HOSPITAL_COMMUNITY): Payer: Self-pay | Admitting: Emergency Medicine

## 2013-12-06 ENCOUNTER — Emergency Department (HOSPITAL_COMMUNITY): Payer: BC Managed Care – PPO

## 2013-12-06 DIAGNOSIS — I10 Essential (primary) hypertension: Secondary | ICD-10-CM | POA: Insufficient documentation

## 2013-12-06 DIAGNOSIS — R69 Illness, unspecified: Secondary | ICD-10-CM

## 2013-12-06 DIAGNOSIS — Z79899 Other long term (current) drug therapy: Secondary | ICD-10-CM | POA: Insufficient documentation

## 2013-12-06 DIAGNOSIS — J111 Influenza due to unidentified influenza virus with other respiratory manifestations: Secondary | ICD-10-CM | POA: Insufficient documentation

## 2013-12-06 DIAGNOSIS — Z87448 Personal history of other diseases of urinary system: Secondary | ICD-10-CM | POA: Insufficient documentation

## 2013-12-06 DIAGNOSIS — Z7982 Long term (current) use of aspirin: Secondary | ICD-10-CM | POA: Insufficient documentation

## 2013-12-06 DIAGNOSIS — R112 Nausea with vomiting, unspecified: Secondary | ICD-10-CM | POA: Insufficient documentation

## 2013-12-06 DIAGNOSIS — R52 Pain, unspecified: Secondary | ICD-10-CM | POA: Insufficient documentation

## 2013-12-06 DIAGNOSIS — E860 Dehydration: Secondary | ICD-10-CM | POA: Insufficient documentation

## 2013-12-06 DIAGNOSIS — Z8719 Personal history of other diseases of the digestive system: Secondary | ICD-10-CM | POA: Insufficient documentation

## 2013-12-06 DIAGNOSIS — R739 Hyperglycemia, unspecified: Secondary | ICD-10-CM

## 2013-12-06 DIAGNOSIS — E785 Hyperlipidemia, unspecified: Secondary | ICD-10-CM | POA: Insufficient documentation

## 2013-12-06 DIAGNOSIS — J329 Chronic sinusitis, unspecified: Secondary | ICD-10-CM | POA: Insufficient documentation

## 2013-12-06 DIAGNOSIS — E119 Type 2 diabetes mellitus without complications: Secondary | ICD-10-CM | POA: Insufficient documentation

## 2013-12-06 LAB — URINALYSIS, ROUTINE W REFLEX MICROSCOPIC
Bilirubin Urine: NEGATIVE
Glucose, UA: >1000 mg/dL — AB
KETONES UR: NEGATIVE mg/dL
Leukocytes, UA: NEGATIVE
NITRITE: NEGATIVE
Protein, ur: NEGATIVE mg/dL
SPECIFIC GRAVITY, URINE: 1.02 (ref 1.005–1.030)
UROBILINOGEN UA: 0.2 mg/dL (ref 0.0–1.0)
pH: 7 (ref 5.0–8.0)

## 2013-12-06 LAB — CBC WITH DIFFERENTIAL/PLATELET
BASOS ABS: 0 10*3/uL (ref 0.0–0.1)
Basophils Relative: 0 % (ref 0–1)
EOS PCT: 4 % (ref 0–5)
Eosinophils Absolute: 0.2 10*3/uL (ref 0.0–0.7)
HCT: 44.7 % (ref 39.0–52.0)
Hemoglobin: 14.5 g/dL (ref 13.0–17.0)
LYMPHS PCT: 39 % (ref 12–46)
Lymphs Abs: 1.6 10*3/uL (ref 0.7–4.0)
MCH: 30.3 pg (ref 26.0–34.0)
MCHC: 32.4 g/dL (ref 30.0–36.0)
MCV: 93.5 fL (ref 78.0–100.0)
Monocytes Absolute: 0.5 10*3/uL (ref 0.1–1.0)
Monocytes Relative: 13 % — ABNORMAL HIGH (ref 3–12)
NEUTROS ABS: 1.8 10*3/uL (ref 1.7–7.7)
Neutrophils Relative %: 45 % (ref 43–77)
PLATELETS: 147 10*3/uL — AB (ref 150–400)
RBC: 4.78 MIL/uL (ref 4.22–5.81)
RDW: 12.8 % (ref 11.5–15.5)
WBC: 4.1 10*3/uL (ref 4.0–10.5)

## 2013-12-06 LAB — GLUCOSE, CAPILLARY: Glucose-Capillary: 236 mg/dL — ABNORMAL HIGH (ref 70–99)

## 2013-12-06 LAB — BASIC METABOLIC PANEL
BUN: 13 mg/dL (ref 6–23)
CALCIUM: 9 mg/dL (ref 8.4–10.5)
CO2: 32 mEq/L (ref 19–32)
CREATININE: 1.02 mg/dL (ref 0.50–1.35)
Chloride: 104 mEq/L (ref 96–112)
GFR calc Af Amer: >90 mL/min (ref >90)
GFR calc non Af Amer: 86 mL/min — ABNORMAL LOW (ref >90)
Glucose, Bld: 288 mg/dL — ABNORMAL HIGH (ref 70–99)
Potassium: 3.7 mEq/L (ref 3.7–5.3)
Sodium: 145 mEq/L (ref 137–147)

## 2013-12-06 LAB — URINE MICROSCOPIC-ADD ON

## 2013-12-06 MED ORDER — OSELTAMIVIR PHOSPHATE 75 MG PO CAPS: 75.0000 mg | ORAL_CAPSULE | Freq: Two times a day (BID) | ORAL | Status: DC

## 2013-12-06 MED ORDER — SODIUM CHLORIDE 0.9 % IV BOLUS (SEPSIS)
1000.0000 mL | Freq: Once | INTRAVENOUS | Status: AC
  Administered 2013-12-06: 1000 mL via INTRAVENOUS

## 2013-12-06 MED ORDER — ONDANSETRON HCL 4 MG/2ML IJ SOLN
4.0000 mg | Freq: Once | INTRAMUSCULAR | Status: AC
  Administered 2013-12-06: 4 mg via INTRAVENOUS
  Filled 2013-12-06: qty 2

## 2013-12-06 MED ORDER — ONDANSETRON 4 MG PO TBDP: 4.0000 mg | ORAL_TABLET | Freq: Three times a day (TID) | ORAL | Status: DC | PRN

## 2013-12-06 MED ORDER — AZITHROMYCIN 250 MG PO TABS: 250.0000 mg | ORAL_TABLET | Freq: Every day | ORAL | Status: DC

## 2013-12-06 MED ORDER — NAPROXEN 500 MG PO TABS: 500.0000 mg | ORAL_TABLET | Freq: Two times a day (BID) | ORAL | Status: DC

## 2013-12-06 NOTE — ED Notes (Signed)
Patient complaining of vomiting, body aches, cough, and fever x 4 days.

## 2013-12-06 NOTE — Discharge Instructions (Signed)
Please take the following medications  #1 Naprosyn twice daily for body aches and fever  #2 Zithromax once daily for sinus infection  #3 Zofran every 6 hours as needed for nausea  #4 Tamiflu twice daily for flulike symptoms (will have minimal effect on your symptoms)   Return to the hospital for severe or worsening symptoms including uncontrolled vomiting, high fevers, worsening difficulty breathing.  Please call your doctor for a followup appointment within 24-48 hours. When you talk to your doctor please let them know that you were seen in the emergency department and have them acquire all of your records so that they can discuss the findings with you and formulate a treatment plan to fully care for your new and ongoing problems.

## 2013-12-06 NOTE — ED Provider Notes (Signed)
CSN: [id number]     Arrival date & time 12/06/13  0331 History   First MD Initiated Contact with Patient 12/06/13 0424     Chief Complaint  Patient presents with  . Emesis  . Fever  . Generalized Body Aches   (Consider location/radiation/quality/duration/timing/severity/associated sxs/prior Treatment) HPI Comments: 48 year old male with a history of diabetes and hypertension who presents with a complaint of nausea vomiting fevers chills and myalgias. He states this started 3 days ago, the symptoms have been persistent, nothing makes it better or worse, it is associated with watery and loose stools which are nonbloody and sinus pressure and tenderness in his upper cheeks and behind his eyes. He denies a stiff neck, denies rashes, denies swelling. He has been coughing up green phlegm and blowing thick mucus out of his nose. The symptoms are now severe. He vomited 4 times between 1:00 and 3:00 in the morning  Patient is a 48 y.o. male presenting with vomiting and fever. The history is provided by the patient and the spouse.  Emesis Fever Associated symptoms: vomiting     Past Medical History  Diagnosis Date  . GERD (gastroesophageal reflux disease)   . Hyperlipidemia   . Hypertension   . Low back pain   . Benign prostatic hypertrophy   . Diabetes mellitus without complication    Past Surgical History  Procedure Laterality Date  . A spinal surgery      Dr Danielle DessElsner   . Shoulder arthroscopy    . Shoulder arthroscopy  05/21/2012    Procedure: ARTHROSCOPY SHOULDER;  Surgeon: Vickki HearingStanley E Harrison, MD;  Location: [facility];  Service: Orthopedics;  Laterality: Right;  . Subacromial decompression  05/21/2012    Procedure: SUBACROMIAL DECOMPRESSION;  Surgeon: Vickki HearingStanley E Harrison, MD;  Location: [facility];  Service: Orthopedics;  Laterality: Right;   History reviewed. No pertinent family history. History  Substance Use Topics  . Smoking status: Never Smoker   . Smokeless tobacco: Not on file  .  Alcohol Use: Yes     Comment: occasionally    Review of Systems  Constitutional: Positive for fever.  Gastrointestinal: Positive for vomiting.  All other systems reviewed and are negative.    Allergies  Demerol and Lansoprazole  Home Medications   Current Outpatient Rx  Name  Route  Sig  Dispense  Refill  . amLODipine (NORVASC) 5 MG tablet   Oral   Take 1 tablet (5 mg total) by mouth daily.   30 tablet   3   . aspirin EC 325 MG EC tablet   Oral   Take 1 tablet (325 mg total) by mouth daily.   30 tablet   0   . atorvastatin (LIPITOR) 40 MG tablet   Oral   Take 1 tablet (40 mg total) by mouth daily at 6 PM.   30 tablet   3   . azithromycin (ZITHROMAX Z-PAK) 250 MG tablet   Oral   Take 1 tablet (250 mg total) by mouth daily. 500mg  PO day 1, then 250mg  PO days 205   6 tablet   0   . cyclobenzaprine (FLEXERIL) 10 MG tablet   Oral   Take 1 tablet (10 mg total) by mouth 3 (three) times daily as needed. For muscle spasms   60 tablet   5   . HYDROcodone-acetaminophen (NORCO) 10-325 MG per tablet   Oral   Take 1 tablet by mouth every 4 (four) hours as needed.   180 tablet   0   .  ibuprofen (ADVIL,MOTRIN) 800 MG tablet   Oral   Take 800 mg by mouth every 8 (eight) hours as needed for moderate pain.         Marland Kitchen lisinopril (PRINIVIL,ZESTRIL) 20 MG tablet   Oral   Take 20 mg by mouth daily.         . metFORMIN (GLUCOPHAGE) 500 MG tablet   Oral   Take 500 mg by mouth 2 (two) times daily with a meal.          . metoprolol (TOPROL XL) 50 MG 24 hr tablet   Oral   Take 50 mg by mouth daily.          . naproxen (NAPROSYN) 500 MG tablet   Oral   Take 1 tablet (500 mg total) by mouth 2 (two) times daily with a meal.   30 tablet   0   . nitroGLYCERIN (NITROSTAT) 0.4 MG SL tablet   Sublingual   Place 1 tablet (0.4 mg total) under the tongue every 5 (five) minutes as needed for chest pain.   30 tablet   12   . ondansetron (ZOFRAN ODT) 4 MG disintegrating  tablet   Oral   Take 1 tablet (4 mg total) by mouth every 8 (eight) hours as needed for nausea.   10 tablet   0   . ondansetron (ZOFRAN) 4 MG tablet   Oral   Take 1 tablet (4 mg total) by mouth every 6 (six) hours.   12 tablet   0   . oseltamivir (TAMIFLU) 75 MG capsule   Oral   Take 1 capsule (75 mg total) by mouth every 12 (twelve) hours.   10 capsule   0   . pravastatin (PRAVACHOL) 40 MG tablet   Oral   Take 40 mg by mouth at bedtime.          BP 146/107  Pulse 92  Temp(Src) 98.5 F (36.9 C) (Oral)  Resp 22  Ht 6\' 5"  (1.956 m)  Wt 265 lb (120.203 kg)  BMI 31.42 kg/m2  SpO2 98% Physical Exam  Nursing note and vitals reviewed. Constitutional: He appears well-developed and well-nourished. No distress.  HENT:  Head: Normocephalic and atraumatic.  Mouth/Throat: Oropharynx is clear and moist. No oropharyngeal exudate.  TM's clear bilaterally, nares with swollen turbinates and d/c, OP clear  Eyes: Conjunctivae and EOM are normal. Pupils are equal, round, and reactive to light. Right eye exhibits no discharge. Left eye exhibits no discharge. No scleral icterus.  Neck: Normal range of motion. Neck supple. No JVD present. No thyromegaly present.  Cardiovascular: Normal rate, regular rhythm, normal heart sounds and intact distal pulses.  Exam reveals no gallop and no friction rub.   No murmur heard. Pulmonary/Chest: Effort normal and breath sounds normal. No respiratory distress. He has no wheezes. He has no rales.  Abdominal: Soft. He exhibits no distension and no mass. Bowel sounds are increased. There is no tenderness.  Musculoskeletal: Normal range of motion. He exhibits no edema and no tenderness.  Lymphadenopathy:    He has no cervical adenopathy.  Neurological: He is alert. Coordination normal.  Skin: Skin is warm and dry. No rash noted. No erythema.  Psychiatric: He has a normal mood and affect. His behavior is normal.    ED Course  Procedures (including  critical care time) Labs Review Labs Reviewed  BASIC METABOLIC PANEL - Abnormal; Notable for the following:    Glucose, Bld 288 (*)    GFR calc non  Af Amer 86 (*)    All other components within normal limits  CBC WITH DIFFERENTIAL - Abnormal; Notable for the following:    Platelets 147 (*)    Monocytes Relative 13 (*)    All other components within normal limits  URINALYSIS, ROUTINE W REFLEX MICROSCOPIC - Abnormal; Notable for the following:    Glucose, UA >1000 (*)    Hgb urine dipstick TRACE (*)    All other components within normal limits  GLUCOSE, CAPILLARY - Abnormal; Notable for the following:    Glucose-Capillary 236 (*)    All other components within normal limits  URINE MICROSCOPIC-ADD ON - Abnormal; Notable for the following:    Bacteria, UA FEW (*)    All other components within normal limits   Imaging Review Dg Chest 2 View  12/06/2013   CLINICAL DATA:  Flu-like symptoms.  Cough, fever.  EXAM: CHEST  2 VIEW  COMPARISON:  Chest radiograph Apr 13, 2013  FINDINGS: Cardiomediastinal silhouette is unremarkable. The lungs are clear without pleural effusions or focal consolidations. Pulmonary vasculature is unremarkable. Trachea projects midline and there is no pneumothorax. Soft tissue planes and included osseous structures are nonsuspicious. Mild degenerative change of the thoracic spine.  IMPRESSION: No acute cardiopulmonary process.   Electronically Signed   By: Awilda Metro   On: 12/06/2013 06:47    EKG Interpretation   None       MDM   1. Influenza-like illness   2. Sinusitis   3. Hyperglycemia   4. Dehydration    Pt has ongoing n/v/d - has f/c and likely sinusitis - his vital signs appear more stable without tachycardia, without hypoxia, with normal temperature. We'll check a chest x-ray to rule out infection in the chest such as pneumonia though I suspect this is a viral flu like illness with a secondary sinusitis. He is a diabetic and is unsure of his blood  sugar levels, states he has been urinating frequently, but check for UTI as well as for hyperglycemia.  Mr. Featherly has improved after getting IV fluids, Zofran, chest x-ray without acute infiltrates.  Described findings to pt who is in agreement for d/c.  Meds given in ED:  Medications  ondansetron (ZOFRAN) injection 4 mg (4 mg Intravenous Given 12/06/13 0509)  sodium chloride 0.9 % bolus 1,000 mL (1,000 mLs Intravenous New Bag/Given 12/06/13 0509)    New Prescriptions   AZITHROMYCIN (ZITHROMAX Z-PAK) 250 MG TABLET    Take 1 tablet (250 mg total) by mouth daily. 500mg  PO day 1, then 250mg  PO days 205   NAPROXEN (NAPROSYN) 500 MG TABLET    Take 1 tablet (500 mg total) by mouth 2 (two) times daily with a meal.   ONDANSETRON (ZOFRAN ODT) 4 MG DISINTEGRATING TABLET    Take 1 tablet (4 mg total) by mouth every 8 (eight) hours as needed for nausea.   OSELTAMIVIR (TAMIFLU) 75 MG CAPSULE    Take 1 capsule (75 mg total) by mouth every 12 (twelve) hours.      Vida Roller, MD 12/06/13 [phone]

## 2013-12-27 ENCOUNTER — Telehealth: Payer: Self-pay | Admitting: Orthopedic Surgery

## 2013-12-27 NOTE — Telephone Encounter (Signed)
Geoffrey DunnStephen Jackson wants a prescription for Hydrocodone

## 2013-12-27 NOTE — Telephone Encounter (Signed)
Patient is on Hydrocodone 10/325 mg.

## 2013-12-28 ENCOUNTER — Other Ambulatory Visit: Payer: Self-pay | Admitting: *Deleted

## 2013-12-28 DIAGNOSIS — M25511 Pain in right shoulder: Secondary | ICD-10-CM

## 2013-12-28 MED ORDER — HYDROCODONE-ACETAMINOPHEN 10-325 MG PO TABS: 1.0000 | ORAL_TABLET | ORAL | Status: DC | PRN

## 2013-12-28 NOTE — Telephone Encounter (Signed)
yes

## 2013-12-28 NOTE — Telephone Encounter (Signed)
Refilled per Dr. Harrison 

## 2013-12-29 NOTE — Telephone Encounter (Signed)
Prescription picked up by the patient °

## 2014-02-01 ENCOUNTER — Telehealth: Payer: Self-pay | Admitting: Orthopedic Surgery

## 2014-02-01 ENCOUNTER — Other Ambulatory Visit: Payer: Self-pay | Admitting: *Deleted

## 2014-02-01 DIAGNOSIS — M25511 Pain in right shoulder: Secondary | ICD-10-CM

## 2014-02-01 MED ORDER — HYDROCODONE-ACETAMINOPHEN 10-325 MG PO TABS: 1.0000 | ORAL_TABLET | ORAL | Status: DC | PRN

## 2014-02-01 NOTE — Telephone Encounter (Signed)
yes

## 2014-02-01 NOTE — Telephone Encounter (Signed)
Geoffrey DunnStephen Chewning wants a prescription for Hydrocodone 10/325

## 2014-02-02 NOTE — Telephone Encounter (Signed)
Refilled and left message for patient to pick up. 

## 2014-02-14 ENCOUNTER — Ambulatory Visit: Payer: BC Managed Care – PPO | Admitting: Orthopedic Surgery

## 2014-02-28 ENCOUNTER — Telehealth: Payer: Self-pay | Admitting: Orthopedic Surgery

## 2014-02-28 NOTE — Telephone Encounter (Signed)
Patient called to request refill on pain medication: HYDROcodone-acetaminophen (NORCO) 10-325 MG per tablet [[id number]][[id number]] His next scheduled appointment is 03/09/14.  His phone # is [phone].

## 2014-02-28 NOTE — Telephone Encounter (Signed)
Routing to Dr Harrison 

## 2014-03-02 ENCOUNTER — Emergency Department (HOSPITAL_COMMUNITY): Payer: BC Managed Care – PPO

## 2014-03-02 ENCOUNTER — Emergency Department (HOSPITAL_COMMUNITY)
Admission: EM | Admit: 2014-03-02 | Discharge: 2014-03-02 | Disposition: A | Discharge status: Home / Self Care | Payer: BC Managed Care – PPO | Attending: Emergency Medicine | Admitting: Emergency Medicine

## 2014-03-02 ENCOUNTER — Encounter (HOSPITAL_COMMUNITY): Payer: Self-pay | Admitting: Emergency Medicine

## 2014-03-02 DIAGNOSIS — R109 Unspecified abdominal pain: Secondary | ICD-10-CM

## 2014-03-02 DIAGNOSIS — Z7982 Long term (current) use of aspirin: Secondary | ICD-10-CM | POA: Insufficient documentation

## 2014-03-02 DIAGNOSIS — M549 Dorsalgia, unspecified: Secondary | ICD-10-CM

## 2014-03-02 DIAGNOSIS — Z792 Long term (current) use of antibiotics: Secondary | ICD-10-CM | POA: Insufficient documentation

## 2014-03-02 DIAGNOSIS — E119 Type 2 diabetes mellitus without complications: Secondary | ICD-10-CM | POA: Insufficient documentation

## 2014-03-02 DIAGNOSIS — E785 Hyperlipidemia, unspecified: Secondary | ICD-10-CM | POA: Insufficient documentation

## 2014-03-02 DIAGNOSIS — W2209XA Striking against other stationary object, initial encounter: Secondary | ICD-10-CM | POA: Insufficient documentation

## 2014-03-02 DIAGNOSIS — Y939 Activity, unspecified: Secondary | ICD-10-CM | POA: Insufficient documentation

## 2014-03-02 DIAGNOSIS — I10 Essential (primary) hypertension: Secondary | ICD-10-CM | POA: Insufficient documentation

## 2014-03-02 DIAGNOSIS — K625 Hemorrhage of anus and rectum: Secondary | ICD-10-CM | POA: Insufficient documentation

## 2014-03-02 DIAGNOSIS — Y929 Unspecified place or not applicable: Secondary | ICD-10-CM | POA: Insufficient documentation

## 2014-03-02 DIAGNOSIS — IMO0002 Reserved for concepts with insufficient information to code with codable children: Secondary | ICD-10-CM | POA: Insufficient documentation

## 2014-03-02 DIAGNOSIS — Z8719 Personal history of other diseases of the digestive system: Secondary | ICD-10-CM | POA: Insufficient documentation

## 2014-03-02 DIAGNOSIS — S298XXA Other specified injuries of thorax, initial encounter: Secondary | ICD-10-CM

## 2014-03-02 DIAGNOSIS — Z87448 Personal history of other diseases of urinary system: Secondary | ICD-10-CM | POA: Insufficient documentation

## 2014-03-02 DIAGNOSIS — S3981XA Other specified injuries of abdomen, initial encounter: Secondary | ICD-10-CM | POA: Insufficient documentation

## 2014-03-02 DIAGNOSIS — Z79899 Other long term (current) drug therapy: Secondary | ICD-10-CM | POA: Insufficient documentation

## 2014-03-02 LAB — COMPREHENSIVE METABOLIC PANEL
ALBUMIN: 3.8 g/dL (ref 3.5–5.2)
ALT: 28 U/L (ref 0–53)
AST: 25 U/L (ref 0–37)
Alkaline Phosphatase: 102 U/L (ref 39–117)
BUN: 16 mg/dL (ref 6–23)
CALCIUM: 9.3 mg/dL (ref 8.4–10.5)
CO2: 26 mEq/L (ref 19–32)
CREATININE: 1.04 mg/dL (ref 0.50–1.35)
Chloride: 105 mEq/L (ref 96–112)
GFR calc non Af Amer: 84 mL/min — ABNORMAL LOW (ref >90)
GLUCOSE: 157 mg/dL — AB (ref 70–99)
Potassium: 3.6 mEq/L — ABNORMAL LOW (ref 3.7–5.3)
Sodium: 143 mEq/L (ref 137–147)
TOTAL PROTEIN: 7.7 g/dL (ref 6.0–8.3)
Total Bilirubin: 0.4 mg/dL (ref 0.3–1.2)

## 2014-03-02 LAB — CBC WITH DIFFERENTIAL/PLATELET
BASOS PCT: 0 % (ref 0–1)
Basophils Absolute: 0 10*3/uL (ref 0.0–0.1)
EOS ABS: 0.1 10*3/uL (ref 0.0–0.7)
EOS PCT: 1 % (ref 0–5)
HCT: 43.6 % (ref 39.0–52.0)
HEMOGLOBIN: 14.7 g/dL (ref 13.0–17.0)
LYMPHS ABS: 2.4 10*3/uL (ref 0.7–4.0)
Lymphocytes Relative: 46 % (ref 12–46)
MCH: 30.8 pg (ref 26.0–34.0)
MCHC: 33.7 g/dL (ref 30.0–36.0)
MCV: 91.2 fL (ref 78.0–100.0)
MONO ABS: 0.3 10*3/uL (ref 0.1–1.0)
MONOS PCT: 7 % (ref 3–12)
Neutro Abs: 2.3 10*3/uL (ref 1.7–7.7)
Neutrophils Relative %: 45 % (ref 43–77)
Platelets: 197 10*3/uL (ref 150–400)
RBC: 4.78 MIL/uL (ref 4.22–5.81)
RDW: 13.4 % (ref 11.5–15.5)
WBC: 5.1 10*3/uL (ref 4.0–10.5)

## 2014-03-02 LAB — POC OCCULT BLOOD, ED: Fecal Occult Bld: NEGATIVE

## 2014-03-02 LAB — CBG MONITORING, ED: GLUCOSE-CAPILLARY: 145 mg/dL — AB (ref 70–99)

## 2014-03-02 MED ORDER — OXYCODONE-ACETAMINOPHEN 5-325 MG PO TABS
2.0000 | ORAL_TABLET | Freq: Once | ORAL | Status: AC
  Administered 2014-03-02: 2 via ORAL
  Filled 2014-03-02: qty 2

## 2014-03-02 NOTE — ED Notes (Signed)
Pt received discharge instructions, verbalized understanding and has no further questions. Pt ambulated to exit in stable condition.  Advised to return to emergency department with new or worsening symptoms.  

## 2014-03-02 NOTE — ED Notes (Signed)
Pt reports over the past 2 days has had pain in sides and in lower back.  Reports left side worse than r.  Reports has had a total of 5 episodes of blood in stool.  Pt says blood is all in commode and at times is dark red and other times is bright red.  Denies n/v.

## 2014-03-02 NOTE — ED Provider Notes (Signed)
CSN: [id number]     Arrival date & time 03/02/14  1532 History  This chart was scribed for Joya Gaskins, MD by Bennett Scrape, ED Scribe. This patient was seen in room APA09/APA09 and the patient's care was started at 4:13 PM.   Chief Complaint  Patient presents with  . Abdominal Pain  . GI Bleeding      Patient is a 48 y.o. male presenting with hematochezia. The history is provided by the patient. No language interpreter was used.  Rectal Bleeding Quality:  Bright red and black and tarry Amount:  Moderate Duration:  2 days Chronicity:  New Context: defecation   Ineffective treatments:  None tried Associated symptoms: abdominal pain   Associated symptoms: no fever, no loss of consciousness and no vomiting   Risk factors: NSAID use   Risk factors: no anticoagulant use     HPI Comments: Geoffrey Jackson is a 48 y.o. male who presents to the Emergency Department complaining of 5 episodes of hematochezia for the past 2 days. Pt states that BMs were dark red at first but became bright red today. He reports associated LLQ abdominal pain and left lower back pain described as burning spasms for the past week after his porch collapsed with him standing on it. The pains are worsened with straining for a BM. He denies any prior episodes or prior abdominal surgeries. He denies being on any anticoagulants but admits to occasional excessive use of Tylenol.  He denies any CP, nausea, emesis or lightheadededness.   PCP is Dr. Delbert Harness   Past Medical History  Diagnosis Date  . GERD (gastroesophageal reflux disease)   . Hyperlipidemia   . Hypertension   . Low back pain   . Benign prostatic hypertrophy   . Diabetes mellitus without complication    Past Surgical History  Procedure Laterality Date  . A spinal surgery      Dr Danielle Dess   . Shoulder arthroscopy    . Shoulder arthroscopy  05/21/2012    Procedure: ARTHROSCOPY SHOULDER;  Surgeon: [name], MD;  Location: [facility];   Service: Orthopedics;  Laterality: Right;  . Subacromial decompression  05/21/2012    Procedure: SUBACROMIAL DECOMPRESSION;  Surgeon: [name], MD;  Location: [facility];  Service: Orthopedics;  Laterality: Right;   No family history on file. History  Substance Use Topics  . Smoking status: Never Smoker   . Smokeless tobacco: Not on file  . Alcohol Use: Yes     Comment: occasionally    Review of Systems  Constitutional: Negative for fever.  Cardiovascular: Negative for chest pain.  Gastrointestinal: Positive for abdominal pain, blood in stool and hematochezia. Negative for vomiting and diarrhea.  Musculoskeletal: Positive for back pain.  Neurological: Negative for loss of consciousness.  All other systems reviewed and are negative.   Allergies  Demerol and Lansoprazole  Home Medications   Current Outpatient Rx  Name  Route  Sig  Dispense  Refill  . amLODipine (NORVASC) 5 MG tablet   Oral   Take 1 tablet (5 mg total) by mouth daily.   30 tablet   3   . aspirin EC 325 MG EC tablet   Oral   Take 1 tablet (325 mg total) by mouth daily.   30 tablet   0   . atorvastatin (LIPITOR) 40 MG tablet   Oral   Take 1 tablet (40 mg total) by mouth daily at 6 PM.   30 tablet  3   . azithromycin (ZITHROMAX Z-PAK) 250 MG tablet   Oral   Take 1 tablet (250 mg total) by mouth daily. 500mg  PO day 1, then 250mg  PO days 205   6 tablet   0   . cyclobenzaprine (FLEXERIL) 10 MG tablet   Oral   Take 1 tablet (10 mg total) by mouth 3 (three) times daily as needed. For muscle spasms   60 tablet   5   . HYDROcodone-acetaminophen (NORCO) 10-325 MG per tablet   Oral   Take 1 tablet by mouth every 4 (four) hours as needed.   180 tablet   0   . ibuprofen (ADVIL,MOTRIN) 800 MG tablet   Oral   Take 800 mg by mouth every 8 (eight) hours as needed for moderate pain.         Marland Kitchen. lisinopril (PRINIVIL,ZESTRIL) 20 MG tablet   Oral   Take 20 mg by mouth daily.         . metFORMIN  (GLUCOPHAGE) 500 MG tablet   Oral   Take 500 mg by mouth 2 (two) times daily with a meal.          . metoprolol (TOPROL XL) 50 MG 24 hr tablet   Oral   Take 50 mg by mouth daily.          . naproxen (NAPROSYN) 500 MG tablet   Oral   Take 1 tablet (500 mg total) by mouth 2 (two) times daily with a meal.   30 tablet   0   . nitroGLYCERIN (NITROSTAT) 0.4 MG SL tablet   Sublingual   Place 1 tablet (0.4 mg total) under the tongue every 5 (five) minutes as needed for chest pain.   30 tablet   12   . ondansetron (ZOFRAN ODT) 4 MG disintegrating tablet   Oral   Take 1 tablet (4 mg total) by mouth every 8 (eight) hours as needed for nausea.   10 tablet   0   . ondansetron (ZOFRAN) 4 MG tablet   Oral   Take 1 tablet (4 mg total) by mouth every 6 (six) hours.   12 tablet   0   . oseltamivir (TAMIFLU) 75 MG capsule   Oral   Take 1 capsule (75 mg total) by mouth every 12 (twelve) hours.   10 capsule   0   . pravastatin (PRAVACHOL) 40 MG tablet   Oral   Take 40 mg by mouth at bedtime.          Triage Vitals: BP 162/106  Pulse 85  Resp 20  Ht 6' (1.829 m)  Wt 275 lb (124.739 kg)  BMI 37.29 kg/m2  SpO2 97%  Physical Exam  Nursing note and vitals reviewed.  CONSTITUTIONAL: Well developed/well nourished HEAD: Normocephalic/atraumatic EYES: EOMI/PERRL ENMT: Mucous membranes moist NECK: supple no meningeal signs SPINE:entire spine non-tender, lumbar paraspinal tenderness, no bruising/crepitance/stepoffs noted to spine CV: S1/S2 noted, no murmurs/rubs/gallops noted CHEST: Left lower costal tenderness to palpation  LUNGS: Lungs are clear to auscultation bilaterally, no apparent distress ABDOMEN: soft, nontender, no rebound or guarding GU:no cva tenderness, no external hemorrhoids noted, no gross blood in the rectal vault, hemoccult negative, chaperone present  NEURO: Pt is awake/alert, moves all extremitiesx4 EXTREMITIES: pulses normal, full ROM SKIN: warm, color  normal PSYCH: no abnormalities of mood noted  ED Course  Procedures (including critical care time)  DIAGNOSTIC STUDIES: Oxygen Saturation is 97% on RA, adequate by my interpretation.    COORDINATION OF  CARE: 4:20 PM-Discussed treatment plan which includes CBC, CMP and UA with pt at bedside and pt agreed to plan.  No signs of acute GI bleed.  Will need outpatient followup Will need to stop NSAIDs Imaging pending to r/o any injury from recent fall  5:54 PM Imaging negative I feel he is safe/stable for outpatient management  Labs Review Labs Reviewed  COMPREHENSIVE METABOLIC PANEL - Abnormal; Notable for the following:    Potassium 3.6 (*)    Glucose, Bld 157 (*)    GFR calc non Af Amer 84 (*)    All other components within normal limits  CBG MONITORING, ED - Abnormal; Notable for the following:    Glucose-Capillary 145 (*)    All other components within normal limits  CBC WITH DIFFERENTIAL  URINALYSIS, ROUTINE W REFLEX MICROSCOPIC  CBG MONITORING, ED  POC OCCULT BLOOD, ED   Imaging Review Dg Ribs Unilateral W/chest Left  03/02/2014   CLINICAL DATA:  Traumatic injury and pain  EXAM: LEFT RIBS AND CHEST - 3+ VIEW  COMPARISON:  None.  FINDINGS: Cardiac shadow is within normal limits. The lungs are well aerated bilaterally. No pneumothorax or sizable effusion is seen. The rib fractures are seen.  IMPRESSION: No acute rib fracture is identified.   Electronically Signed   By: Alcide Clever M.D.   On: 03/02/2014 17:29   Dg Lumbar Spine Complete  03/02/2014   CLINICAL DATA:  Traumatic injury and pain  EXAM: LUMBAR SPINE - COMPLETE 4+ VIEW  COMPARISON:  None.  FINDINGS: Five lumbar type vertebral bodies are well visualized. Vertebral body height is well maintained. Multilevel osteophytic changes are noted anteriorly. No spondylolysis or spondylolisthesis is seen. Mild disc space narrowing is noted at L4-5. No acute fracture is seen.  IMPRESSION: No acute abnormality is identified.    Electronically Signed   By: Alcide Clever M.D.   On: 03/02/2014 17:29     EKG Interpretation   Date/Time:  Thursday March 02 2014 15:42:30 EDT Ventricular Rate:  82 PR Interval:  152 QRS Duration: 90 QT Interval:  420 QTC Calculation: 490 R Axis:   -4 Text Interpretation:  Normal sinus rhythm Possible Left atrial enlargement  Nonspecific ST and T wave abnormality Prolonged QT Abnormal ECG When  compared with ECG of 14-Apr-2013 04:52, Vent. rate has increased BY  33  BPM T wave amplitude has decreased in Inferior leads T wave inversion no  longer evident in Lateral leads QT has lengthened Confirmed by Bebe Shaggy   MD, Dorinda Hill ([id number]) on 03/02/2014 3:55:30 PM      MDM   Final diagnoses:  Abdominal pain  Blunt chest trauma  Rectal bleeding  Back pain    I personally performed the services described in this documentation, which was scribed in my presence. The recorded information has been reviewed and is accurate.        Joya Gaskins, MD 03/02/14 1754

## 2014-03-02 NOTE — Discharge Instructions (Signed)
SEEK IMMEDIATE MEDICAL ATTENTION IF: °New numbness, tingling, weakness, or problem with the use of your arms or legs.  °Severe back pain not relieved with medications.  °Change in bowel or bladder control (if you lose control of stool or urine, or if you are unable to urinate) °Increasing pain in any areas of the body (such as chest or abdominal pain).  °Shortness of breath, dizziness or fainting.  °Nausea (feeling sick to your stomach), vomiting, fever, or sweats. ° °Abdominal (belly) pain can be caused by many things. any cases can be observed and treated at home after initial evaluation in the emergency department. Even though you are being discharged home, abdominal pain can be unpredictable. Therefore, you need a repeated exam if your pain does not resolve, returns, or worsens. Most patients with abdominal pain don't have to be admitted to the hospital or have surgery, but serious problems like appendicitis and gallbladder attacks can start out as nonspecific pain. Many abdominal conditions cannot be diagnosed in one visit, so follow-up evaluations are very important. °SEEK IMMEDIATE MEDICAL ATTENTION IF: °The pain does not go away or becomes severe, particularly over the next 8-12 hours.  °A temperature above 100.4F develops.  °Repeated vomiting occurs (multiple episodes).  °The pain becomes localized to portions of the abdomen. The right side could possibly be appendicitis. In an adult, the left lower portion of the abdomen could be colitis or diverticulitis.  °Blood is being passed in stools or vomit (bright red or black tarry stools).  °Return also if you develop chest pain, difficulty breathing, dizziness or fainting, or become confused, poorly responsive, or inconsolable. ° °

## 2014-03-09 ENCOUNTER — Ambulatory Visit (INDEPENDENT_AMBULATORY_CARE_PROVIDER_SITE_OTHER): Payer: BC Managed Care – PPO | Admitting: Orthopedic Surgery

## 2014-03-09 VITALS — BP 146/94 | Ht 72.0 in | Wt 275.0 lb

## 2014-03-09 DIAGNOSIS — M549 Dorsalgia, unspecified: Secondary | ICD-10-CM

## 2014-03-09 DIAGNOSIS — M25519 Pain in unspecified shoulder: Secondary | ICD-10-CM

## 2014-03-09 DIAGNOSIS — M25511 Pain in right shoulder: Secondary | ICD-10-CM

## 2014-03-09 MED ORDER — HYDROCODONE-ACETAMINOPHEN 10-325 MG PO TABS: 1.0000 | ORAL_TABLET | ORAL | Status: DC | PRN

## 2014-03-09 MED ORDER — METHOCARBAMOL 750 MG PO TABS: 750.0000 mg | ORAL_TABLET | Freq: Four times a day (QID) | ORAL | Status: DC

## 2014-03-09 NOTE — Progress Notes (Signed)
Patient ID: Geoffrey FerriesStephen D Jackson, male   DOB: July 12, 1966, 48 y.o.   MRN: [id number] Chief Complaint  Patient presents with  . Back Pain    Low back pain, Injury 2 weeks ago    New injury. 48 year old male status post thoracic disc surgery presents with new injury after falling through some steps. He complains of back pain and left rib cage pain. Date of injury was 2 weeks ago. Complains of sharp stabbing burning pain 10 constant went to the emergency room because of blood in his stool. 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 smoking or alcohol use  Review of systems blurred vision blood in the stool frequency urgency most pain joint pain all other systems were negative  Vital signs:  BP 146/94  Ht 6' (1.829 m)  Wt 275 lb (124.739 kg)  BMI 37.29 kg/m2  General the patient is well-developed and well-nourished grooming and hygiene are normal Oriented x3 Mood and affect normal Ambulation normal  Inspection of the legs : Full range of motion All joints are stable Motor exam is normal Skin clean dry and intact  Rib cage tenderness left rib cage  Lower back tenderness reflexes 2+ equal normal straight leg raise is negative  Cardiovascular exam is normal Sensory exam normal  Encounter Diagnoses  Name Primary?  . Back pain Yes  . Right shoulder pain    thoracic rib contusion  Recommend Robaxin 750 mg Continue hydrocodone Physical therapy for 4 weeks  Come back in 4 weeks

## 2014-03-09 NOTE — Telephone Encounter (Signed)
Received prescription at office visit 03/09/14

## 2014-03-09 NOTE — Patient Instructions (Signed)
Call to arrange therapy 

## 2014-03-22 ENCOUNTER — Telehealth: Payer: Self-pay | Admitting: Orthopedic Surgery

## 2014-03-22 NOTE — Telephone Encounter (Signed)
Please call [name] at [facility] at [phone].  He has a question about the therapy order for Geoffrey DunnStephen Jackson.

## 2014-03-22 NOTE — Telephone Encounter (Signed)
Returned call to Madelaine BhatAdam, therapist, he stated he did not think Jeannett SeniorStephen needed the TENS unit or Dexamethasone, but if Dr. Romeo AppleHarrison wanted it , he would need written script. I advised I thought Dr. Romeo AppleHarrison was ok with whatever treatment he thought was necessary to treat patient.

## 2014-04-03 ENCOUNTER — Other Ambulatory Visit: Payer: Self-pay | Admitting: Orthopedic Surgery

## 2014-04-03 ENCOUNTER — Telehealth: Payer: Self-pay | Admitting: Orthopedic Surgery

## 2014-04-03 DIAGNOSIS — M25511 Pain in right shoulder: Secondary | ICD-10-CM

## 2014-04-03 MED ORDER — HYDROCODONE-ACETAMINOPHEN 10-325 MG PO TABS: 1.0000 | ORAL_TABLET | ORAL | Status: DC | PRN

## 2014-04-03 NOTE — Telephone Encounter (Signed)
Patient called to request refill of pain medication: HYDROcodone-acetaminophen (NORCO) 10-325 MG per tablet.  His next scheduled follow up appointment is 04/13/14, following physical therapy. His phone # is [phone].

## 2014-04-03 NOTE — Telephone Encounter (Signed)
Routing to Dr Harrison 

## 2014-04-03 NOTE — Telephone Encounter (Signed)
Left message for patient that prescription would not available for pick up until Thursday 04/06/14.

## 2014-04-03 NOTE — Telephone Encounter (Signed)
His is due Thursday Do not give until thurs

## 2014-04-13 ENCOUNTER — Ambulatory Visit: Payer: BC Managed Care – PPO | Admitting: Orthopedic Surgery

## 2014-05-02 ENCOUNTER — Encounter: Payer: Self-pay | Admitting: Orthopedic Surgery

## 2014-05-02 ENCOUNTER — Encounter: Payer: BC Managed Care – PPO | Admitting: Orthopedic Surgery

## 2014-05-02 NOTE — Progress Notes (Signed)
This encounter was created in error - please disregard.

## 2014-05-08 ENCOUNTER — Telehealth: Payer: Self-pay | Admitting: Orthopedic Surgery

## 2014-05-08 NOTE — Telephone Encounter (Signed)
Patient called and requested a refill on both hydrocodone and flexoril. Patient phone is [phone]

## 2014-05-08 NOTE — Telephone Encounter (Signed)
Routing to Dr Harrison 

## 2014-05-09 ENCOUNTER — Other Ambulatory Visit: Payer: Self-pay | Admitting: *Deleted

## 2014-05-09 ENCOUNTER — Other Ambulatory Visit: Payer: Self-pay | Admitting: Orthopedic Surgery

## 2014-05-09 DIAGNOSIS — M62838 Other muscle spasm: Secondary | ICD-10-CM

## 2014-05-09 DIAGNOSIS — M25511 Pain in right shoulder: Secondary | ICD-10-CM

## 2014-05-09 MED ORDER — HYDROCODONE-ACETAMINOPHEN 10-325 MG PO TABS: 1.0000 | ORAL_TABLET | ORAL | Status: DC | PRN

## 2014-05-09 MED ORDER — CYCLOBENZAPRINE HCL 10 MG PO TABS: 10.0000 mg | ORAL_TABLET | Freq: Three times a day (TID) | ORAL | Status: DC | PRN

## 2014-05-09 NOTE — Telephone Encounter (Signed)
Dr. Romeo AppleHarrison refilled, and patient notified prescription is ready for pick up.

## 2014-05-30 ENCOUNTER — Ambulatory Visit: Payer: BC Managed Care – PPO | Admitting: Orthopedic Surgery

## 2014-06-12 ENCOUNTER — Other Ambulatory Visit: Payer: Self-pay | Admitting: Orthopedic Surgery

## 2014-06-12 ENCOUNTER — Telehealth: Payer: Self-pay | Admitting: *Deleted

## 2014-06-12 DIAGNOSIS — M25511 Pain in right shoulder: Secondary | ICD-10-CM

## 2014-06-12 MED ORDER — HYDROCODONE-ACETAMINOPHEN 10-325 MG PO TABS: 1.0000 | ORAL_TABLET | ORAL | Status: DC | PRN

## 2014-06-12 NOTE — Telephone Encounter (Signed)
Patient requesting refill on Hydrocodone

## 2014-06-19 ENCOUNTER — Other Ambulatory Visit: Payer: Self-pay | Admitting: *Deleted

## 2014-06-19 MED ORDER — IBUPROFEN 800 MG PO TABS: 800.0000 mg | ORAL_TABLET | Freq: Three times a day (TID) | ORAL | Status: DC | PRN

## 2014-06-27 ENCOUNTER — Ambulatory Visit (INDEPENDENT_AMBULATORY_CARE_PROVIDER_SITE_OTHER): Payer: BC Managed Care – PPO | Admitting: Orthopedic Surgery

## 2014-06-27 VITALS — BP 180/116 | Ht 72.0 in | Wt 275.0 lb

## 2014-06-27 DIAGNOSIS — M25511 Pain in right shoulder: Secondary | ICD-10-CM

## 2014-06-27 DIAGNOSIS — M25519 Pain in unspecified shoulder: Secondary | ICD-10-CM

## 2014-06-27 MED ORDER — HYDROCODONE-ACETAMINOPHEN 10-325 MG PO TABS: 1.0000 | ORAL_TABLET | ORAL | Status: DC | PRN

## 2014-06-27 NOTE — Progress Notes (Signed)
Chief Complaint  Patient presents with  . Follow-up    6 month follow up right shoulder    The patient comes in today after multiple canceled or rescheduled visits he still has biceps tendon pain after biceps tenotomy. He probably need a biceps tenodesis.  He wants his pain medication refilled is having some difficulties with his back and radicular symptoms into his left leg after therapy for a traumatic injury to the lower back and mid thoracic region. He had an thoracic spine surgery many years ago. I informed him that he will need to see his back doctor for further care regarding that  As far as the shoulder goes and the arm goes on the right he has tenderness in the biceps tendon and biceps tendon sheath and I think a biceps tenodesis would do him well when he can schedule it.  Meds ordered this encounter  Medications  . HYDROcodone-acetaminophen (NORCO) 10-325 MG per tablet    Sig: Take 1 tablet by mouth every 4 (four) hours as needed.    Dispense:  180 tablet    Refill:  0

## 2014-08-08 ENCOUNTER — Other Ambulatory Visit: Payer: Self-pay | Admitting: *Deleted

## 2014-08-08 ENCOUNTER — Telehealth: Payer: Self-pay | Admitting: Orthopedic Surgery

## 2014-08-08 DIAGNOSIS — M25511 Pain in right shoulder: Secondary | ICD-10-CM

## 2014-08-08 MED ORDER — HYDROCODONE-ACETAMINOPHEN 10-325 MG PO TABS: 1.0000 | ORAL_TABLET | ORAL | Status: DC | PRN

## 2014-08-08 NOTE — Telephone Encounter (Signed)
Prescription available for pick up, patient aware 

## 2014-08-08 NOTE — Telephone Encounter (Signed)
Patient called to request refill, pain medication: Hydrocodone/Norco 10-325.  His ph# is [phone]

## 2014-09-06 ENCOUNTER — Telehealth: Payer: Self-pay | Admitting: Orthopedic Surgery

## 2014-09-06 ENCOUNTER — Other Ambulatory Visit: Payer: Self-pay | Admitting: *Deleted

## 2014-09-06 DIAGNOSIS — M25511 Pain in right shoulder: Secondary | ICD-10-CM

## 2014-09-06 MED ORDER — HYDROCODONE-ACETAMINOPHEN 10-325 MG PO TABS: 1.0000 | ORAL_TABLET | ORAL | Status: DC | PRN

## 2014-09-06 NOTE — Telephone Encounter (Signed)
Patient is asking for a refill on pain medication HYDROcodone-acetaminophen (NORCO) 10-325 MG per tablet please advise, he is having Rt Shoulder Pain

## 2014-09-07 NOTE — Telephone Encounter (Signed)
Patient picked up Rx

## 2014-09-07 NOTE — Telephone Encounter (Signed)
Prescription available for pick up, called patient, no answer 

## 2014-10-06 ENCOUNTER — Telehealth: Payer: Self-pay | Admitting: Orthopedic Surgery

## 2014-10-06 NOTE — Telephone Encounter (Signed)
Patient is calling asking for a refill on pain medicationHYDROcodone-acetaminophen (NORCO) 10-325 MG per tablet , please advise?

## 2014-10-09 ENCOUNTER — Other Ambulatory Visit: Payer: Self-pay | Admitting: *Deleted

## 2014-10-09 DIAGNOSIS — M25511 Pain in right shoulder: Secondary | ICD-10-CM

## 2014-10-09 MED ORDER — HYDROCODONE-ACETAMINOPHEN 10-325 MG PO TABS: 1.0000 | ORAL_TABLET | ORAL | Status: DC | PRN

## 2014-10-09 NOTE — Telephone Encounter (Signed)
PATIENT PICKED UP RX 

## 2014-10-09 NOTE — Telephone Encounter (Signed)
Prescription available for pick up, called patient, no answer 

## 2014-10-24 ENCOUNTER — Encounter: Payer: Self-pay | Admitting: Orthopedic Surgery

## 2014-10-24 ENCOUNTER — Ambulatory Visit (INDEPENDENT_AMBULATORY_CARE_PROVIDER_SITE_OTHER): Payer: BC Managed Care – PPO | Admitting: Orthopedic Surgery

## 2014-10-24 VITALS — BP 200/112 | Ht 72.0 in | Wt 275.0 lb

## 2014-10-24 DIAGNOSIS — M25511 Pain in right shoulder: Secondary | ICD-10-CM

## 2014-10-24 DIAGNOSIS — M7521 Bicipital tendinitis, right shoulder: Secondary | ICD-10-CM

## 2014-10-24 MED ORDER — HYDROCODONE-ACETAMINOPHEN 10-325 MG PO TABS: 1.0000 | ORAL_TABLET | ORAL | Status: AC | PRN

## 2014-10-24 NOTE — Patient Instructions (Signed)
Surgery Dec 18

## 2014-10-25 ENCOUNTER — Encounter: Payer: Self-pay | Admitting: Orthopedic Surgery

## 2014-10-25 ENCOUNTER — Other Ambulatory Visit: Payer: Self-pay | Admitting: *Deleted

## 2014-10-25 NOTE — Progress Notes (Signed)
Patient ID: Geoffrey Jackson, male   DOB: 1966-08-17, 48 y.o.   MRN: [id number]   Chief Complaint  Patient presents with  . Follow-up    3 month recheck Rt shoulder    HPI Geoffrey Jackson is a 48 y.o. male.   HPI Chief complaint continued pain right shoulder  History Geoffrey Jackson had arthroscopy of the right shoulder for instability symptoms back in 2011. 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 He has been on the schedule for surgery in the past and one occasion had a blood pressure issue. He has also had difficulties with his work schedule and trying to schedule surgery. He is now ready to have surgery due to continued pain  Past Medical History  Diagnosis Date  . GERD (gastroesophageal reflux disease)   . Hyperlipidemia   . Hypertension   . Low back pain   . Benign prostatic hypertrophy   . Diabetes mellitus without complication     Past Surgical History  Procedure Laterality Date  . A spinal surgery      Dr Danielle DessElsner   . Shoulder arthroscopy    . Shoulder arthroscopy  05/21/2012    Procedure: ARTHROSCOPY SHOULDER;  Surgeon: Vickki HearingStanley E Harrison, MD;  Location: [facility];  Service: Orthopedics;  Laterality: Right;  . Subacromial decompression  05/21/2012    Procedure: SUBACROMIAL DECOMPRESSION;  Surgeon: Vickki HearingStanley E Harrison, MD;  Location: [facility];  Service: Orthopedics;  Laterality: Right;    No family history on file.  Social History History  Substance Use Topics  . Smoking status: Never Smoker   . Smokeless tobacco: Not on file  . Alcohol Use: Yes     Comment:  occasionally    Allergies  Allergen Reactions  . Demerol [Meperidine] Itching  . Lansoprazole Other (See Comments)    unknown    Current Outpatient Prescriptions  Medication Sig Dispense Refill  . cyclobenzaprine (FLEXERIL) 10 MG tablet Take 1 tablet (10 mg total) by mouth 3 (three) times daily as needed. For muscle spasms 60 tablet 5  . HYDROcodone-acetaminophen (NORCO) 10-325 MG per tablet Take 1 tablet by mouth every 4 (four) hours as needed. 72 tablet 0  . ibuprofen (ADVIL,MOTRIN) 800 MG tablet Take 1 tablet (800 mg total) by mouth every 8 (eight) hours as needed for moderate pain. 90 tablet 5  . amLODipine (NORVASC) 5 MG tablet Take 1 tablet (5 mg total) by mouth daily. (Patient not taking: Reported on 10/24/2014) 30 tablet 3  . aspirin EC 325 MG EC tablet Take 1 tablet (325 mg total) by mouth daily. (Patient not taking: Reported on 10/24/2014) 30 tablet 0  . atorvastatin (LIPITOR) 40 MG tablet Take 1 tablet (40 mg total) by mouth daily at 6 PM. (Patient not taking: Reported on 10/24/2014) 30 tablet 3  . lisinopril (PRINIVIL,ZESTRIL) 20 MG tablet Take 20 mg by mouth daily.    . metFORMIN (GLUCOPHAGE) 500 MG tablet Take 500 mg by mouth 2 (two) times daily with a meal.     . methocarbamol (ROBAXIN) 750 MG tablet Take 1 tablet (750 mg total) by mouth 4 (four) times  daily. (Patient not taking: Reported on 10/24/2014) 60 tablet 2  . metoprolol (TOPROL XL) 50 MG 24 hr tablet Take 50 mg by mouth daily.     . nitroGLYCERIN (NITROSTAT) 0.4 MG SL tablet Place 1 tablet (0.4 mg total) under the tongue every 5 (five) minutes as needed for chest pain. (Patient not taking: Reported on 10/24/2014) 30 tablet 12  . ondansetron (ZOFRAN) 4 MG tablet Take 1 tablet (4 mg total) by mouth every 6 (six) hours. (Patient not taking: Reported on 10/24/2014) 12 tablet 0  . pravastatin (PRAVACHOL) 40 MG tablet Take 40 mg by mouth at bedtime.     No current facility-administered medications for this visit.    Review of  Systems Review of Systems  Constitutional: Negative.   Eyes: Negative.   Respiratory: Negative.   Cardiovascular: Negative.   Gastrointestinal: Negative.   Endocrine: Negative.   Genitourinary: Negative.   Musculoskeletal: Positive for back pain and neck pain.  Skin: Negative.   Allergic/Immunologic: Negative.   Neurological: Positive for headaches.  Hematological: Negative.   Psychiatric/Behavioral: Negative.     Blood pressure 200/112, height 6' (1.829 m), weight 275 lb (124.739 kg).  Physical Exam Physical Exam  Data Reviewed Previous MRI from 2015. 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 Rotator cuff degeneration right shoulder    Plan    Open biceps tenodesis right shoulder       Fuller CanadaStanley Harrison 10/25/2014, 8:36 AM

## 2014-10-27 ENCOUNTER — Telehealth: Payer: Self-pay | Admitting: Orthopedic Surgery

## 2014-10-27 NOTE — Telephone Encounter (Signed)
Regarding out-patient surgery scheduled 11/10/14 at [facility], planned CPT codes [id number], [phone], contacted insurer, [address] [employer] [place] [phone]; per Laurina BustleShamika M,  no pre-authorization required.  Reference is her name, and today's date 10/27/14 at 9:54a.m.

## 2014-11-02 NOTE — Patient Instructions (Addendum)
PATIENT INSTRUCTIONS POST-ANESTHESIA  IMMEDIATELY FOLLOWING SURGERY:  Do not drive or operate machinery for the first twenty four hours after surgery.  Do not make any important decisions for twenty four hours after surgery or while taking narcotic pain medications or sedatives.  If you develop intractable nausea and vomiting or a severe headache please notify your doctor immediately.  FOLLOW-UP:  Please make an appointment with your surgeon as instructed. You do not need to follow up with anesthesia unless specifically instructed to do so.  WOUND CARE INSTRUCTIONS (if applicable):  Keep a dry clean dressing on the anesthesia/puncture wound site if there is drainage.  Once the wound has quit draining you may leave it open to air.  Generally you should leave the bandage intact for twenty four hours unless there is drainage.  If the epidural site drains for more than 36-48 hours please call the anesthesia department.  QUESTIONS?:  Please feel free to call your physician or the hospital operator if you have any questions, and they will be happy to assist you.         Geoffrey FerriesStephen D Jackson  11/02/2014   Your procedure is scheduled on:   11/10/2014  [facility]  [facility]  [facility].  Call this number if you have problems the morning of surgery: [phone]   Remember:   Do not eat food or drink liquids after midnight.   Take these medicines the morning of surgery with A SIP OF WATER: amlodipine, benicar, bystolic, metoprolol, flexaril, hydrocodone, lisinopril, robaxin, zofran   Do not wear jewelry, make-up or nail polish.  Do not wear lotions, powders, or perfumes.   Do not shave 48 hours prior to surgery. Men may shave face and neck.  Do not bring valuables to the hospital.  [facility] any belongings or valuables.               Contacts, dentures or bridgework may not be worn into surgery.  Leave suitcase in the car. After surgery it may be brought to your  room.  For patients admitted to the hospital, discharge time is determined by your treatment team.               Patients discharged the day of surgery will not be allowed to drive home.  Name and phone number of your driver: family  Special Instructions: Shower using CHG 2 nights before surgery and the night before surgery.  If you shower the day of surgery use CHG.  Use special wash - you have one bottle of CHG for all showers.  You should use approximately 1/3 of the bottle for each shower.   Please read over the following fact sheets that you were given: Pain Booklet, Coughing and Deep Breathing, Surgical Site Infection Prevention, Anesthesia Post-op Instructions and Care and Recovery After Surgery Bicipital Tendonitis Bicipital tendonitis refers to redness, soreness, and swelling (inflammation) or irritation of the bicep tendon. The biceps muscle is located between the elbow and shoulder of the inner arm. The tendon heads, similar to pieces of rope, connect the bicep muscle to the shoulder socket. They are called short head and long head tendons. When tendonitis occurs, the long head tendon is inflamed and swollen, and may be thickened or partially torn.  Bicipital tendonitis can occur with other problems as well, such as arthritis in the shoulder or acromioclavicular joints, tears in the tendons, or other rotator cuff problems.  CAUSES  Overuse of of  the arms for overhead activities is the major cause of tendonitis. Many athletes, such as swimmers, baseball players, and tennis players are prone to bicipital tendonitis. Jobs that require manual labor or routine chores, especially chores involving overhead activities can result in overuse and tendonitis. SYMPTOMS Symptoms may include:  Pain in and around the front of the shoulder. Pain may be worse with overhead motion.  Pain or aching that radiates down the arm.  Clicking or shifting sensations in the shoulder. DIAGNOSIS Your caregiver may  perform the following:  Physical exam and tests of the biceps and shoulder to observe range of motion, strength, and stability.  X-rays or magnetic resonance imaging (MRI) to confirm the diagnosis. In most common cases, these tests are not necessary. Since other problems may exist in the shoulder or rotator cuff, additional tests may be recommended. TREATMENT Treatment may include the following:  Medications  Your caregiver may prescribe over-the-counter pain relievers.  Steroid injections, such as cortisone, may be recommended. These may help to reduce inflammation and pain.  Physical Therapy - Your caregiver may recommend gentle exercises with the arm. These can help restore strength and range of motion. They may be done at home or with a physical therapist's supervision and input.  Surgery - Arthroscopic or open surgery sometimes is necessary. Surgery may include:  Reattachment or repair of the tendon at the shoulder socket.  Removal of the damaged section of the tendon.  Anchoring the tendon to a different area of the shoulder (tenodesis). HOME CARE INSTRUCTIONS   Avoid overhead motion of the affected arm or any other motion that causes pain.  Take medication for pain as directed. Do not take these for more than 3 weeks, unless directed to do so by your caregiver.  Ice the affected area for 20 minutes at a time, 3-4 times per day. Place a towel on the skin over the painful area and the ice or cold pack over the towel. Do not place ice directly on the skin.  Perform gentle exercises at home as directed. These will increase strength and flexibility. PREVENTION  Modify your activities as much as possible to protect your arm. A physical therapist or sports medicine physician can help you understand options for safe motion.  Avoid repetitive overhead pulling, lifting, reaching, and throwing until your caregiver tells you it is ok to resume these activities. SEEK MEDICAL CARE  IF:  Your pain worsens.  You have difficulty moving the affected arm.  You have trouble performing any of the self-care instructions. MAKE SURE YOU:   Understand these instructions.  Will watch your condition.  Will get help right away if you are not doing well or get worse. Document Released: 12/13/2010 Document Revised: 02/02/2012 Document Reviewed: 12/13/2010 [facility] Patient Information 2015 [employer] [place]. This information is not intended to replace advice given to you by your health care provider. Make sure you discuss any questions you have with your health care provider.

## 2014-11-03 NOTE — H&P (Signed)
Patient ID: Geoffrey Jackson, male   DOB: 05-Mar-1966, 48 y.o.   MRN: [id number]   Chief Complaint  Patient presents with  . Pre-op HP    Rt shoulder pain     HPI Geoffrey Jackson is a 48 y.o. male.   HPI Chief complaint continued pain right shoulder  History Geoffrey Jackson had arthroscopy of the right shoulder for instability symptoms back in 2011. He followed that with a arthroscopy of the right shoulder in 2013 which include biceps tenotomy subacromial decompression and rotator cuff debridement  He continued to have pain in 2015 and had an MRI which showed a small PASTA (partial articular surface tendon avulsion)  lesion. He also had moderate acromio-clavicular joint arthritis though asymptomatic and had glenohumeral mild degenerative changes  We have treated him with physical therapy, injection, adequate pain medication including Norco 10 mg. He has been on the schedule for surgery in the past and one occasion had a blood pressure issue. He has also had difficulties with his work schedule and trying to schedule surgery. He is now ready to have surgery due to continued pain  Past Medical History  Diagnosis Date  . GERD (gastroesophageal reflux disease)   . Hyperlipidemia   . Hypertension   . Low back pain   . Benign prostatic hypertrophy   . Diabetes mellitus without complication     Past Surgical History  Procedure Laterality Date  . A spinal surgery      Dr Danielle DessElsner   . Shoulder arthroscopy    . Shoulder arthroscopy  05/21/2012    Procedure: ARTHROSCOPY SHOULDER;  Surgeon: Vickki HearingStanley E Harrison, MD;  Location: [facility];  Service: Orthopedics;  Laterality: Right;  . Subacromial decompression  05/21/2012    Procedure: SUBACROMIAL DECOMPRESSION;  Surgeon: Vickki HearingStanley E Harrison, MD;  Location: [facility];  Service: Orthopedics;  Laterality: Right;    No family history on file.  Social History History  Substance Use Topics  . Smoking status: Never Smoker   . Smokeless tobacco: Not on  file  . Alcohol Use: Yes     Comment: occasionally    Allergies  Allergen Reactions  . Demerol [Meperidine] Itching  . Lansoprazole Other (See Comments)    unknown    Current Outpatient Prescriptions  Medication Sig Dispense Refill  . cyclobenzaprine (FLEXERIL) 10 MG tablet Take 1 tablet (10 mg total) by mouth 3 (three) times daily as needed. For muscle spasms 60 tablet 5  . HYDROcodone-acetaminophen (NORCO) 10-325 MG per tablet Take 1 tablet by mouth every 4 (four) hours as needed. 72 tablet 0  . ibuprofen (ADVIL,MOTRIN) 800 MG tablet Take 1 tablet (800 mg total) by mouth every 8 (eight) hours as needed for moderate pain. 90 tablet 5  . amLODipine (NORVASC) 5 MG tablet Take 1 tablet (5 mg total) by mouth daily. (Patient not taking: Reported on 10/24/2014) 30 tablet 3  . aspirin EC 325 MG EC tablet Take 1 tablet (325 mg total) by mouth daily. (Patient not taking: Reported on 10/24/2014) 30 tablet 0  . atorvastatin (LIPITOR) 40 MG tablet Take 1 tablet (40 mg total) by mouth daily at 6 PM. (Patient not taking: Reported on 10/24/2014) 30 tablet 3  . lisinopril (PRINIVIL,ZESTRIL) 20 MG tablet Take 20 mg by mouth daily.    . metFORMIN (GLUCOPHAGE) 500 MG tablet Take 500 mg by mouth 2 (two) times daily with a meal.     . methocarbamol (ROBAXIN) 750 MG tablet Take 1 tablet (750 mg total) by  mouth 4 (four) times daily. (Patient not taking: Reported on 10/24/2014) 60 tablet 2  . metoprolol (TOPROL XL) 50 MG 24 hr tablet Take 50 mg by mouth daily.     . nitroGLYCERIN (NITROSTAT) 0.4 MG SL tablet Place 1 tablet (0.4 mg total) under the tongue every 5 (five) minutes as needed for chest pain. (Patient not taking: Reported on 10/24/2014) 30 tablet 12  . ondansetron (ZOFRAN) 4 MG tablet Take 1 tablet (4 mg total) by mouth every 6 (six) hours. (Patient not taking: Reported on 10/24/2014) 12 tablet 0  . pravastatin (PRAVACHOL) 40 MG tablet Take 40 mg by mouth at bedtime.     No current facility-administered  medications for this visit.    Review of Systems Review of Systems  Constitutional: Negative.   Eyes: Negative.   Respiratory: Negative.   Cardiovascular: Negative.   Gastrointestinal: Negative.   Endocrine: Negative.   Genitourinary: Negative.   Musculoskeletal: Positive for back pain and neck pain.  Skin: Negative.   Allergic/Immunologic: Negative.   Neurological: Positive for headaches.  Hematological: Negative.   Psychiatric/Behavioral: Negative.     Blood pressure 200/112, height 6' (1.829 m), weight 275 lb (124.739 kg).  Physical Exam Physical Exam Somewhat larger well-developed well-nourished male note congenital abnormalities. He is oriented 3 his mood is pleasant his affect is upbeat his gait is normal  He has no problems in his lower extremities at this time  His left shoulder range of motion is full stability tests were normal cuff strength is grade 5 skin is intact he has good radial pulse normal sensation no lymphadenopathy and no palpable tenderness  The right shoulder of course is a different story has palpable tenderness in the rotator cuff interval and over the biceps tendon and the biceps sheath this is most notable with the arm externally rotated. He has no weakness in his rotator cuff however. His shoulder is very stable.. Acromial palpation reveals no tenderness. Full forward elevation is noted as well as external and internal rotation although somewhat painful. Skin is intact previous incisions are normal pulses are good lymph nodes are negative sensation is intact reflexes are 2+ and symmetric    Data Reviewed Previous MRI from 2015. 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 Rotator cuff degeneration right shoulder    Plan    Open biceps tenodesis right shoulder       Fuller CanadaStanley Harrison 10/25/2014, 8:36 AM

## 2014-11-06 ENCOUNTER — Other Ambulatory Visit: Payer: Self-pay

## 2014-11-06 ENCOUNTER — Encounter (HOSPITAL_COMMUNITY)
Admission: RE | Admit: 2014-11-06 | Discharge: 2014-11-06 | Disposition: A | Discharge status: Home / Self Care | Payer: BC Managed Care – PPO | Source: Ambulatory Visit | Attending: Orthopedic Surgery | Admitting: Orthopedic Surgery

## 2014-11-06 ENCOUNTER — Encounter (HOSPITAL_COMMUNITY): Payer: Self-pay

## 2014-11-06 DIAGNOSIS — Z01812 Encounter for preprocedural laboratory examination: Secondary | ICD-10-CM | POA: Diagnosis present

## 2014-11-06 LAB — BASIC METABOLIC PANEL
Anion gap: 13 (ref 5–15)
BUN: 14 mg/dL (ref 6–23)
CALCIUM: 9.3 mg/dL (ref 8.4–10.5)
CO2: 25 meq/L (ref 19–32)
Chloride: 104 mEq/L (ref 96–112)
Creatinine, Ser: 1.03 mg/dL (ref 0.50–1.35)
GFR calc Af Amer: >90 mL/min (ref >90)
GFR calc non Af Amer: 84 mL/min — ABNORMAL LOW (ref >90)
Glucose, Bld: 354 mg/dL — ABNORMAL HIGH (ref 70–99)
Potassium: 3.9 mEq/L (ref 3.7–5.3)
SODIUM: 142 meq/L (ref 137–147)

## 2014-11-06 LAB — HEMOGLOBIN AND HEMATOCRIT, BLOOD
HEMATOCRIT: 43.8 % (ref 39.0–52.0)
Hemoglobin: 14.8 g/dL (ref 13.0–17.0)

## 2014-11-07 NOTE — Progress Notes (Signed)
Called Dr. Carmina Millerondiegos office, pt's PCP, and left message for return call due to increased blood sugar at time of pre-op appt for surgery on 11/10/14. Pt stated during interview that since he had lost weight he was no longer diabetic and had not been taking him diabetic medications (Metformin 500mg  PO BID on file as last current diabetic medications). Charlene Peeler, front office staff returned call and said they would try to get in touch with pt and have him come into the office tomorrow morning for a check up concerning his blood sugar.

## 2014-11-08 NOTE — Pre-Procedure Instructions (Signed)
Notified Dr. Jayme CloudGonzalez of blood glucose of 354 mg/dl. He asked that I let Dr. Romeo AppleHarrison know. Notified him. He stated that it would not keep him from performing the surgery if Dr. Jayme CloudGonzalez was ok with it. Left message with Dr. Otilio SaberonDiego's office to call.

## 2014-11-10 ENCOUNTER — Encounter (HOSPITAL_COMMUNITY): Payer: Self-pay

## 2014-11-10 ENCOUNTER — Ambulatory Visit (HOSPITAL_COMMUNITY)
Admission: RE | Admit: 2014-11-10 | Discharge: 2014-11-10 | Disposition: A | Discharge status: Home / Self Care | Payer: BC Managed Care – PPO | Source: Ambulatory Visit | Attending: Orthopedic Surgery | Admitting: Orthopedic Surgery

## 2014-11-10 ENCOUNTER — Ambulatory Visit (HOSPITAL_COMMUNITY): Payer: BC Managed Care – PPO | Admitting: Anesthesiology

## 2014-11-10 ENCOUNTER — Encounter (HOSPITAL_COMMUNITY)
Admission: RE | Disposition: A | Discharge status: Home / Self Care | Payer: Self-pay | Source: Ambulatory Visit | Attending: Orthopedic Surgery

## 2014-11-10 DIAGNOSIS — Z888 Allergy status to other drugs, medicaments and biological substances status: Secondary | ICD-10-CM | POA: Insufficient documentation

## 2014-11-10 DIAGNOSIS — N400 Enlarged prostate without lower urinary tract symptoms: Secondary | ICD-10-CM | POA: Insufficient documentation

## 2014-11-10 DIAGNOSIS — K219 Gastro-esophageal reflux disease without esophagitis: Secondary | ICD-10-CM | POA: Diagnosis not present

## 2014-11-10 DIAGNOSIS — E119 Type 2 diabetes mellitus without complications: Secondary | ICD-10-CM | POA: Diagnosis not present

## 2014-11-10 DIAGNOSIS — I10 Essential (primary) hypertension: Secondary | ICD-10-CM | POA: Insufficient documentation

## 2014-11-10 DIAGNOSIS — M545 Low back pain: Secondary | ICD-10-CM | POA: Insufficient documentation

## 2014-11-10 DIAGNOSIS — M7520 Bicipital tendinitis, unspecified shoulder: Secondary | ICD-10-CM | POA: Insufficient documentation

## 2014-11-10 DIAGNOSIS — M7521 Bicipital tendinitis, right shoulder: Secondary | ICD-10-CM | POA: Insufficient documentation

## 2014-11-10 DIAGNOSIS — E785 Hyperlipidemia, unspecified: Secondary | ICD-10-CM | POA: Diagnosis not present

## 2014-11-10 HISTORY — PX: BICEPT TENODESIS: SHX5116

## 2014-11-10 LAB — GLUCOSE, CAPILLARY
Glucose-Capillary: 149 mg/dL — ABNORMAL HIGH (ref 70–99)
Glucose-Capillary: 189 mg/dL — ABNORMAL HIGH (ref 70–99)

## 2014-11-10 SURGERY — TENODESIS, BICEPS
Anesthesia: General | Site: Shoulder | Laterality: Right

## 2014-11-10 MED ORDER — DEXTROSE 5 % IV SOLN
3.0000 g | INTRAVENOUS | Status: AC
  Administered 2014-11-10: 3 g via INTRAVENOUS
  Filled 2014-11-10: qty 3000

## 2014-11-10 MED ORDER — EPHEDRINE SULFATE 50 MG/ML IJ SOLN
INTRAMUSCULAR | Status: AC
  Filled 2014-11-10: qty 1

## 2014-11-10 MED ORDER — CEFAZOLIN SODIUM 1-5 GM-% IV SOLN
INTRAVENOUS | Status: AC
  Filled 2014-11-10: qty 50

## 2014-11-10 MED ORDER — OXYCODONE HCL 5 MG PO TABS
ORAL_TABLET | ORAL | Status: AC
  Filled 2014-11-10: qty 1

## 2014-11-10 MED ORDER — PROMETHAZINE HCL 12.5 MG PO TABS: 12.5000 mg | ORAL_TABLET | Freq: Four times a day (QID) | ORAL | Status: DC | PRN

## 2014-11-10 MED ORDER — SODIUM CHLORIDE 0.9 % IJ SOLN
INTRAMUSCULAR | Status: AC
  Filled 2014-11-10: qty 10

## 2014-11-10 MED ORDER — MIDAZOLAM HCL 2 MG/2ML IJ SOLN
1.0000 mg | INTRAMUSCULAR | Status: DC | PRN
  Administered 2014-11-10: 2 mg via INTRAVENOUS

## 2014-11-10 MED ORDER — ACETAMINOPHEN 500 MG PO TABS
500.0000 mg | ORAL_TABLET | Freq: Once | ORAL | Status: AC
  Administered 2014-11-10: 500 mg via ORAL
  Filled 2014-11-10: qty 1

## 2014-11-10 MED ORDER — CHLORHEXIDINE GLUCONATE 4 % EX LIQD: 60.0000 mL | Freq: Once | CUTANEOUS | Status: DC

## 2014-11-10 MED ORDER — FENTANYL CITRATE 0.05 MG/ML IJ SOLN
INTRAMUSCULAR | Status: AC
  Filled 2014-11-10: qty 2

## 2014-11-10 MED ORDER — LACTATED RINGERS IV SOLN
INTRAVENOUS | Status: DC
  Administered 2014-11-10 (×2): via INTRAVENOUS

## 2014-11-10 MED ORDER — OXYCODONE HCL 5 MG PO TABS
5.0000 mg | ORAL_TABLET | Freq: Once | ORAL | Status: AC
  Administered 2014-11-10: 5 mg via ORAL

## 2014-11-10 MED ORDER — OXYCODONE-ACETAMINOPHEN 5-325 MG PO TABS: 1.0000 | ORAL_TABLET | ORAL | Status: DC | PRN

## 2014-11-10 MED ORDER — SUCCINYLCHOLINE CHLORIDE 20 MG/ML IJ SOLN
INTRAMUSCULAR | Status: AC
  Filled 2014-11-10: qty 1

## 2014-11-10 MED ORDER — ONDANSETRON HCL 4 MG/2ML IJ SOLN
INTRAMUSCULAR | Status: AC
  Filled 2014-11-10: qty 2

## 2014-11-10 MED ORDER — PROPOFOL 10 MG/ML IV BOLUS
INTRAVENOUS | Status: AC
  Filled 2014-11-10: qty 20

## 2014-11-10 MED ORDER — MIDAZOLAM HCL 2 MG/2ML IJ SOLN
INTRAMUSCULAR | Status: AC
  Filled 2014-11-10: qty 2

## 2014-11-10 MED ORDER — FENTANYL CITRATE 0.05 MG/ML IJ SOLN
INTRAMUSCULAR | Status: DC | PRN
  Administered 2014-11-10: 50 ug via INTRAVENOUS
  Administered 2014-11-10: 25 ug via INTRAVENOUS
  Administered 2014-11-10: 50 ug via INTRAVENOUS
  Administered 2014-11-10 (×3): 25 ug via INTRAVENOUS
  Administered 2014-11-10: 50 ug via INTRAVENOUS

## 2014-11-10 MED ORDER — FENTANYL CITRATE 0.05 MG/ML IJ SOLN
25.0000 ug | INTRAMUSCULAR | Status: DC | PRN
  Administered 2014-11-10: 25 ug via INTRAVENOUS
  Administered 2014-11-10 (×2): 50 ug via INTRAVENOUS
  Administered 2014-11-10: 25 ug via INTRAVENOUS
  Administered 2014-11-10: 50 ug via INTRAVENOUS
  Filled 2014-11-10: qty 2

## 2014-11-10 MED ORDER — SUCCINYLCHOLINE CHLORIDE 20 MG/ML IJ SOLN
INTRAMUSCULAR | Status: DC | PRN
  Administered 2014-11-10: 140 mg via INTRAVENOUS

## 2014-11-10 MED ORDER — EPHEDRINE SULFATE 50 MG/ML IJ SOLN
INTRAMUSCULAR | Status: DC | PRN
  Administered 2014-11-10 (×2): 5 mg via INTRAVENOUS
  Administered 2014-11-10: 10 mg via INTRAVENOUS
  Administered 2014-11-10 (×5): 5 mg via INTRAVENOUS

## 2014-11-10 MED ORDER — GLYCOPYRROLATE 0.2 MG/ML IJ SOLN
INTRAMUSCULAR | Status: AC
  Filled 2014-11-10: qty 2

## 2014-11-10 MED ORDER — KETOROLAC TROMETHAMINE 30 MG/ML IJ SOLN
INTRAMUSCULAR | Status: AC
  Filled 2014-11-10: qty 1

## 2014-11-10 MED ORDER — KETOROLAC TROMETHAMINE 30 MG/ML IJ SOLN
30.0000 mg | Freq: Once | INTRAMUSCULAR | Status: AC
  Administered 2014-11-10: 30 mg via INTRAVENOUS

## 2014-11-10 MED ORDER — ROCURONIUM BROMIDE 100 MG/10ML IV SOLN
INTRAVENOUS | Status: DC | PRN
  Administered 2014-11-10: 5 mg via INTRAVENOUS
  Administered 2014-11-10: 25 mg via INTRAVENOUS
  Administered 2014-11-10: 10 mg via INTRAVENOUS

## 2014-11-10 MED ORDER — LIDOCAINE HCL (CARDIAC) 10 MG/ML IV SOLN
INTRAVENOUS | Status: DC | PRN
  Administered 2014-11-10: 20 mg via INTRAVENOUS

## 2014-11-10 MED ORDER — ONDANSETRON HCL 4 MG/2ML IJ SOLN
4.0000 mg | Freq: Once | INTRAMUSCULAR | Status: AC
  Administered 2014-11-10: 4 mg via INTRAVENOUS

## 2014-11-10 MED ORDER — FENTANYL CITRATE 0.05 MG/ML IJ SOLN
INTRAMUSCULAR | Status: AC
  Filled 2014-11-10: qty 5

## 2014-11-10 MED ORDER — BUPIVACAINE-EPINEPHRINE 0.5% -1:200000 IJ SOLN
INTRAMUSCULAR | Status: DC | PRN
  Administered 2014-11-10: 60 mL

## 2014-11-10 MED ORDER — GLYCOPYRROLATE 0.2 MG/ML IJ SOLN
INTRAMUSCULAR | Status: DC | PRN
  Administered 2014-11-10: 0.4 mg via INTRAVENOUS

## 2014-11-10 MED ORDER — LIDOCAINE HCL (PF) 1 % IJ SOLN
INTRAMUSCULAR | Status: AC
Start: 2014-11-10 — End: 2014-11-10
  Filled 2014-11-10: qty 5

## 2014-11-10 MED ORDER — ONDANSETRON HCL 4 MG/2ML IJ SOLN
4.0000 mg | Freq: Once | INTRAMUSCULAR | Status: AC | PRN
  Administered 2014-11-10: 4 mg via INTRAVENOUS
  Filled 2014-11-10: qty 2

## 2014-11-10 MED ORDER — PROPOFOL 10 MG/ML IV BOLUS
INTRAVENOUS | Status: DC | PRN
  Administered 2014-11-10: 20 mg via INTRAVENOUS
  Administered 2014-11-10: 150 mg via INTRAVENOUS

## 2014-11-10 MED ORDER — ROCURONIUM BROMIDE 50 MG/5ML IV SOLN
INTRAVENOUS | Status: AC
  Filled 2014-11-10: qty 1

## 2014-11-10 MED ORDER — SODIUM CHLORIDE 0.9 % IR SOLN
Status: DC | PRN
  Administered 2014-11-10: 1000 mL

## 2014-11-10 MED ORDER — NEOSTIGMINE METHYLSULFATE 10 MG/10ML IV SOLN
INTRAVENOUS | Status: DC | PRN
  Administered 2014-11-10: 2 mg via INTRAVENOUS

## 2014-11-10 MED ORDER — SUCCINYLCHOLINE CHLORIDE 20 MG/ML IJ SOLN
INTRAMUSCULAR | Status: AC
  Filled 2014-11-10: qty 2

## 2014-11-10 MED ORDER — METHOCARBAMOL 1000 MG/10ML IJ SOLN
500.0000 mg | Freq: Once | INTRAVENOUS | Status: AC
  Administered 2014-11-10: 500 mg via INTRAVENOUS
  Filled 2014-11-10: qty 5

## 2014-11-10 MED ORDER — LIDOCAINE HCL (PF) 1 % IJ SOLN
INTRAMUSCULAR | Status: AC
  Filled 2014-11-10: qty 5

## 2014-11-10 MED ORDER — BUPIVACAINE-EPINEPHRINE (PF) 0.5% -1:200000 IJ SOLN
INTRAMUSCULAR | Status: AC
  Filled 2014-11-10: qty 60

## 2014-11-10 MED ORDER — CEFAZOLIN SODIUM-DEXTROSE 2-3 GM-% IV SOLR
INTRAVENOUS | Status: AC
  Filled 2014-11-10: qty 50

## 2014-11-10 SURGICAL SUPPLY — 56 items
ANCHOR SUT SWIVELLOK BIO (Anchor) ×3 IMPLANT
BAG HAMPER (MISCELLANEOUS) ×3 IMPLANT
BANDAGE ELASTIC 4 VELCRO NS (GAUZE/BANDAGES/DRESSINGS) IMPLANT
BANDAGE ESMARK 4X12 BL STRL LF (DISPOSABLE) ×1 IMPLANT
BLADE 10 SAFETY STRL DISP (BLADE) ×3 IMPLANT
BNDG COHESIVE 4X5 TAN STRL (GAUZE/BANDAGES/DRESSINGS) ×3 IMPLANT
BNDG ESMARK 4X12 BLUE STRL LF (DISPOSABLE) ×3
CHLORAPREP W/TINT 26ML (MISCELLANEOUS) ×3 IMPLANT
CLOTH BEACON ORANGE TIMEOUT ST (SAFETY) ×3 IMPLANT
COVER LIGHT HANDLE STERIS (MISCELLANEOUS) ×6 IMPLANT
CUFF TOURNIQUET SINGLE 18IN (TOURNIQUET CUFF) IMPLANT
CUFF TOURNIQUET SINGLE 24IN (TOURNIQUET CUFF) IMPLANT
DECANTER SPIKE VIAL GLASS SM (MISCELLANEOUS) ×3 IMPLANT
DRAPE ORTHO 2.5IN SPLIT 77X108 (DRAPES) ×2 IMPLANT
DRAPE ORTHO SPLIT 77X108 STRL (DRAPES) ×4
DRESSING ALLEVYN BORDER HEEL (GAUZE/BANDAGES/DRESSINGS) IMPLANT
DRSG MEPILEX BORDER 4X12 (GAUZE/BANDAGES/DRESSINGS) ×3 IMPLANT
ELECT REM PT RETURN 9FT ADLT (ELECTROSURGICAL) ×3
ELECTRODE REM PT RTRN 9FT ADLT (ELECTROSURGICAL) ×1 IMPLANT
GAUZE SPONGE 4X4 12PLY STRL (GAUZE/BANDAGES/DRESSINGS) IMPLANT
GAUZE XEROFORM 5X9 LF (GAUZE/BANDAGES/DRESSINGS) IMPLANT
GLOVE BIOGEL M 6.5 STRL (GLOVE) ×3 IMPLANT
GLOVE BIOGEL M 7.0 STRL (GLOVE) ×3 IMPLANT
GLOVE INDICATOR 7.0 STRL GRN (GLOVE) ×6 IMPLANT
GLOVE SKINSENSE NS SZ8.0 LF (GLOVE) ×2
GLOVE SKINSENSE STRL SZ8.0 LF (GLOVE) ×1 IMPLANT
GLOVE SS N UNI LF 8.5 STRL (GLOVE) ×3 IMPLANT
GOWN STRL REUS W/TWL LRG LVL3 (GOWN DISPOSABLE) ×6 IMPLANT
GOWN STRL REUS W/TWL XL LVL3 (GOWN DISPOSABLE) ×3 IMPLANT
INST SET MINOR BONE (KITS) ×3 IMPLANT
KIT BIO-TENODESIS 3X8 DISP (MISCELLANEOUS) ×2
KIT INSRT BABSR STRL DISP BTN (MISCELLANEOUS) ×1 IMPLANT
KIT ROOM TURNOVER APOR (KITS) ×3 IMPLANT
MANIFOLD NEPTUNE II (INSTRUMENTS) ×3 IMPLANT
NEEDLE HYPO 21X1.5 SAFETY (NEEDLE) ×3 IMPLANT
NEEDLE MAYO 6 CRC TAPER PT (NEEDLE) IMPLANT
NS IRRIG 1000ML POUR BTL (IV SOLUTION) ×3 IMPLANT
PACK BASIC LIMB (CUSTOM PROCEDURE TRAY) IMPLANT
PAD ABD 5X9 TENDERSORB (GAUZE/BANDAGES/DRESSINGS) IMPLANT
PAD ARMBOARD 7.5X6 YLW CONV (MISCELLANEOUS) ×3 IMPLANT
PAD CAST 4YDX4 CTTN HI CHSV (CAST SUPPLIES) IMPLANT
PADDING CAST COTTON 4X4 STRL (CAST SUPPLIES)
PASSER SUT CAPTURE FIRST (SUTURE) IMPLANT
SET BASIN LINEN APH (SET/KITS/TRAYS/PACK) ×3 IMPLANT
SLING ARM LRG ADULT FOAM STRAP (SOFTGOODS) ×3 IMPLANT
SPONGE LAP 18X18 X RAY DECT (DISPOSABLE) ×6 IMPLANT
STAPLER VISISTAT 35W (STAPLE) ×3 IMPLANT
SUT MON AB 0 CT1 (SUTURE) ×3 IMPLANT
SUT MON AB 2-0 SH 27 (SUTURE) ×2
SUT MON AB 2-0 SH27 (SUTURE) ×1 IMPLANT
SUT SILK 2 0 (SUTURE)
SUT SILK 2-0 18XBRD TIE 12 (SUTURE) IMPLANT
SYR 30ML LL (SYRINGE) ×3 IMPLANT
SYR BULB IRRIGATION 50ML (SYRINGE) ×3 IMPLANT
SYRINGE 10CC LL (SYRINGE) ×3 IMPLANT
TOWEL OR 17X26 4PK STRL BLUE (TOWEL DISPOSABLE) ×3 IMPLANT

## 2014-11-10 NOTE — Anesthesia Postprocedure Evaluation (Signed)
  Anesthesia Post-op Note  Patient: Geoffrey Jackson  Procedure(s) Performed: Procedure(s) with comments: BICEPS TENODESIS (Right) - biceps tendon  Patient Location: PACU  Anesthesia Type:General  Level of Consciousness: awake and patient cooperative  Airway and Oxygen Therapy: Patient Spontanous Breathing  Post-op Pain: moderate  Post-op Assessment: Post-op Vital signs reviewed, Patient's Cardiovascular Status Stable, Respiratory Function Stable, Patent Airway and No signs of Nausea or vomiting  Post-op Vital Signs: Reviewed and stable   Complications: No apparent anesthesia complications

## 2014-11-10 NOTE — Anesthesia Procedure Notes (Signed)
Procedure Name: Intubation Date/Time: 11/10/2014 7:44 AM Performed by: Franco NonesYATES, Don Tiu S Pre-anesthesia Checklist: Patient identified, Patient being monitored, Timeout performed, Emergency Drugs available and Suction available Patient Re-evaluated:Patient Re-evaluated prior to inductionOxygen Delivery Method: Circle System Utilized Preoxygenation: Pre-oxygenation with 100% oxygen Intubation Type: IV induction, Rapid sequence and Cricoid Pressure applied Laryngoscope Size: Miller and 2 Grade View: Grade II Tube type: Oral Tube size: 7.0 mm Number of attempts: 1 Airway Equipment and Method: stylet and Oral airway Placement Confirmation: ETT inserted through vocal cords under direct vision,  positive ETCO2 and breath sounds checked- equal and bilateral Secured at: 22 cm Tube secured with: Tape Dental Injury: Teeth and Oropharynx as per pre-operative assessment

## 2014-11-10 NOTE — Op Note (Signed)
11/10/2014  9:13 AM  PATIENT:  Geoffrey FerriesStephen D Frisk  48 y.o. male  PRE-OPERATIVE DIAGNOSIS:  Biceps tendinitis  POST-OPERATIVE DIAGNOSIS:  Biceps tendinitis  PROCEDURE:  Procedure(s) with comments: BICEPS TENODESIS (Right) - biceps tendon  SURGEON:  Surgeon(s) and Role:    * Vickki HearingStanley E Harrison, MD - Primary  PHYSICIAN ASSISTANT:   ASSISTANTS: betty ashley   ANESTHESIA:   general  EBL:  Total I/O In: 1000 [I.V.:1000] Out: 10 [Blood:10]  BLOOD ADMINISTERED:none  DRAINS: none   LOCAL MEDICATIONS USED:  MARCAINE     SPECIMEN:  No Specimen  DISPOSITION OF SPECIMEN:  N/A  COUNTS:  YES  TOURNIQUET:  * No tourniquets in log *  DICTATION: .Dragon Dictation  PLAN OF CARE: Discharge to home after PACU  PATIENT DISPOSITION:  PACU - hemodynamically stable.   Delay start of Pharmacological VTE agent (>24hrs) due to surgical blood loss or risk of bleeding: not applicable  Details of procedure  The patient was identified in the preop holding area using 2 approved identification markers. The right shoulder was marked as a surgical site confirmed with the patient and chart review was completed. He was taken to the operating room given the appropriate amount of Ancef based on his weight of 264 pounds  He was placed in the modified beachchair position after adequate general anesthesia was administered via intubation.  The right arm was prepped and draped sterilely. Timeout was appropriately completed.  A deltopectoral incision was made over the right shoulder. Subtendinous tissue was divided. The deltoid muscle was retracted laterally pectoralis was retracted inferiorly and medially cephalic vein was preserved. Blunt dissection was carried out deep retractors were placed. The biceps tendon was identified by finger palpation the tendon sheath was opened. Blunt and sharp dissection was carried out to obtain the proximal portion of the biceps tendon which had scarred above the supine  approved.  Fiber loop suture was placed in the tendon approximately 25 mm of tendon and remaining tendon was excised. The biceps tendon sheath was opened and the tendon was tenodesed using the musculotendinous junction border as a landmark. Appropriate tension was placed on the tendon to estimate the tenodesis site.  Subperiosteal dissection was carried out and the attachment/tenodesis site was prepared by first measuring the tendon which measures 6 mm. Appropriate pin and over reaming was performed using a cortical reaming. Tenodesis screw was placed and then a second suture was passed and tied over the screw for secondary fixation. Attention was applied to the tendon indicating proper fixation. Range of motion of the elbow and arm are proper and appropriate.  The wound was irrigated and closed with 0 and 2-0 Monocryl and skin staples adding Marcaine with epinephrine for postoperative anesthesia  Patient was placed in a sling after the sterile dressing was placed.  Extubation and returned to recovery room in stable condition

## 2014-11-10 NOTE — Interval H&P Note (Signed)
History and Physical Interval Note:  11/10/2014 7:21 AM  Geoffrey Jackson  has presented today for surgery, with the diagnosis of Biceps tendinitis  The various methods of treatment have been discussed with the patient and family. After consideration of risks, benefits and other options for treatment, the patient has consented to  Procedure(s): BICEPS TENODESIS (Right) as a surgical intervention .  The patient's history has been reviewed, patient examined, no change in status, stable for surgery.  I have reviewed the patient's chart and labs.  Questions were answered to the patient's satisfaction.     Fuller CanadaStanley Harrison

## 2014-11-10 NOTE — Anesthesia Preprocedure Evaluation (Signed)
Anesthesia Evaluation  Patient identified by MRN, date of birth, ID band Patient awake    Reviewed: Allergy & Precautions, H&P , NPO status , Patient's Chart, lab work & pertinent test results, reviewed documented beta blocker date and time   Airway Mallampati: II  TM Distance: >3 FB Neck ROM: Full    Dental no notable dental hx.    Pulmonary sleep apnea ,    Pulmonary exam normal       Cardiovascular hypertension, Pt. on medications and Pt. on home beta blockers Rhythm:Regular Rate:Normal     Neuro/Psych  Headaches,  Neuromuscular disease negative psych ROS   GI/Hepatic GERD-  Medicated,  Endo/Other  diabetes, Type 2, Oral Hypoglycemic Agents  Renal/GU      Musculoskeletal  (+) Arthritis -, Osteoarthritis,    Abdominal (+) + obese,  Abdomen: soft.    Peds  Hematology negative hematology ROS (+)   Anesthesia Other Findings   Reproductive/Obstetrics                             Anesthesia Physical Anesthesia Plan  ASA: III  Anesthesia Plan: General   Post-op Pain Management:    Induction: Intravenous, Rapid sequence and Cricoid pressure planned  Airway Management Planned: Oral ETT  Additional Equipment:   Intra-op Plan:   Post-operative Plan: Extubation in OR  Informed Consent: I have reviewed the patients History and Physical, chart, labs and discussed the procedure including the risks, benefits and alternatives for the proposed anesthesia with the patient or authorized representative who has indicated his/her understanding and acceptance.   Dental advisory given  Plan Discussed with: CRNA  Anesthesia Plan Comments:         Anesthesia Quick Evaluation

## 2014-11-10 NOTE — Transfer of Care (Addendum)
Immediate Anesthesia Transfer of Care Note  Patient: Rosaria FerriesStephen D Homan  Procedure(s) Performed: Procedure(s) (LRB): BICEPS TENODESIS (Right)  Patient Location: PACU  Anesthesia Type: General  Level of Consciousness: awake  Airway & Oxygen Therapy: Patient Spontanous Breathing and non-rebreather face mask  Post-op Assessment: Report given to PACU RN, Post -op Vital signs reviewed and stable and Patient moving all extremities  Post vital signs: Reviewed and stable  Complications: No apparent anesthesia complications

## 2014-11-10 NOTE — Brief Op Note (Signed)
11/10/2014  9:13 AM  PATIENT:  Geoffrey Jackson  48 y.o. male  PRE-OPERATIVE DIAGNOSIS:  Biceps tendinitis  POST-OPERATIVE DIAGNOSIS:  Biceps tendinitis  PROCEDURE:  Procedure(s) with comments: BICEPS TENODESIS (Right) - biceps tendon  SURGEON:  Surgeon(s) and Role:    * Stanley E Harrison, MD - Primary  PHYSICIAN ASSISTANT:   ASSISTANTS: betty ashley   ANESTHESIA:   general  EBL:  Total I/O In: 1000 [I.V.:1000] Out: 10 [Blood:10]  BLOOD ADMINISTERED:none  DRAINS: none   LOCAL MEDICATIONS USED:  MARCAINE     SPECIMEN:  No Specimen  DISPOSITION OF SPECIMEN:  N/A  COUNTS:  YES  TOURNIQUET:  * No tourniquets in log *  DICTATION: .Dragon Dictation  PLAN OF CARE: Discharge to home after PACU  PATIENT DISPOSITION:  PACU - hemodynamically stable.   Delay start of Pharmacological VTE agent (>24hrs) due to surgical blood loss or risk of bleeding: not applicable  Details of procedure  The patient was identified in the preop holding area using 2 approved identification markers. The right shoulder was marked as a surgical site confirmed with the patient and chart review was completed. He was taken to the operating room given the appropriate amount of Ancef based on his weight of 264 pounds  He was placed in the modified beachchair position after adequate general anesthesia was administered via intubation.  The right arm was prepped and draped sterilely. Timeout was appropriately completed.  A deltopectoral incision was made over the right shoulder. Subtendinous tissue was divided. The deltoid muscle was retracted laterally pectoralis was retracted inferiorly and medially cephalic vein was preserved. Blunt dissection was carried out deep retractors were placed. The biceps tendon was identified by finger palpation the tendon sheath was opened. Blunt and sharp dissection was carried out to obtain the proximal portion of the biceps tendon which had scarred above the supine  approved.  Fiber loop suture was placed in the tendon approximately 25 mm of tendon and remaining tendon was excised. The biceps tendon sheath was opened and the tendon was tenodesed using the musculotendinous junction border as a landmark. Appropriate tension was placed on the tendon to estimate the tenodesis site.  Subperiosteal dissection was carried out and the attachment/tenodesis site was prepared by first measuring the tendon which measures 6 mm. Appropriate pin and over reaming was performed using a cortical reaming. Tenodesis screw was placed and then a second suture was passed and tied over the screw for secondary fixation. Attention was applied to the tendon indicating proper fixation. Range of motion of the elbow and arm are proper and appropriate.  The wound was irrigated and closed with 0 and 2-0 Monocryl and skin staples adding Marcaine with epinephrine for postoperative anesthesia  Patient was placed in a sling after the sterile dressing was placed.  Extubation and returned to recovery room in stable condition 

## 2014-11-10 NOTE — Discharge Instructions (Signed)
Incision Care °An incision is when a surgeon cuts into your body tissues. After surgery, the incision needs to be cared for properly to prevent infection.  °HOME CARE INSTRUCTIONS  °· Take all medicine as directed by your caregiver. Only take over-the-counter or prescription medicines for pain, discomfort, or fever as directed by your caregiver. °· Do not remove your bandage (dressing) or get your incision wet until your surgeon gives you permission. In the event that your dressing becomes wet, dirty, or starts to smell, change the dressing and call your surgeon for instructions as soon as possible. °· Take showers. Do not take tub baths, swim, or do anything that may soak the wound until it is healed. °· Resume your normal diet and activities as directed or allowed. °· Avoid lifting any weight until you are instructed otherwise. °· Use anti-itch antihistamine medicine as directed by your caregiver. The wound may itch when it is healing. Do not pick or scratch at the wound. °· Follow up with your caregiver for stitch (suture) or staple removal as directed. °· Drink enough fluids to keep your urine clear or pale yellow. °SEEK MEDICAL CARE IF:  °· You have redness, swelling, or increasing pain in the wound that is not controlled with medicine. °· You have drainage, blood, or pus coming from the wound that lasts longer than 1 day. °· You develop muscle aches, chills, or a general ill feeling. °· You notice a bad smell coming from the wound or dressing. °· Your wound edges separate after the sutures, staples, or skin adhesive strips have been removed. °· You develop persistent nausea or vomiting. °SEEK IMMEDIATE MEDICAL CARE IF:  °· You have a fever. °· You develop a rash. °· You develop dizzy episodes or faint while standing. °· You have difficulty breathing. °· You develop any reaction or side effects to medicine given. °MAKE SURE YOU:  °· Understand these instructions. °· Will watch your condition. °· Will get help  right away if you are not doing well or get worse. °Document Released: 05/30/2005 Document Revised: 02/02/2012 Document Reviewed: 01/04/2014 °Exit[employer]® Patient Information ©2015 [employer]. This information is not intended to replace advice given to you by your health care provider. Make sure you discuss any questions you have with your health care provider. ° ° °

## 2014-11-13 ENCOUNTER — Ambulatory Visit (INDEPENDENT_AMBULATORY_CARE_PROVIDER_SITE_OTHER): Payer: Self-pay | Admitting: Orthopedic Surgery

## 2014-11-13 ENCOUNTER — Encounter: Payer: Self-pay | Admitting: Orthopedic Surgery

## 2014-11-13 VITALS — BP 177/119 | Ht 73.0 in | Wt 264.0 lb

## 2014-11-13 DIAGNOSIS — S46111D Strain of muscle, fascia and tendon of long head of biceps, right arm, subsequent encounter: Secondary | ICD-10-CM

## 2014-11-13 DIAGNOSIS — S46211D Strain of muscle, fascia and tendon of other parts of biceps, right arm, subsequent encounter: Secondary | ICD-10-CM

## 2014-11-13 MED ORDER — OXYCODONE-ACETAMINOPHEN 5-325 MG PO TABS: 1.0000 | ORAL_TABLET | ORAL | Status: DC | PRN

## 2014-11-13 NOTE — Progress Notes (Signed)
Patient ID: Geoffrey FerriesStephen D Cherne, male   DOB: 10-Aug-1966, 48 y.o.   MRN: [id number] Postop visit #1 postop day #3  11/10/2014  9:13 AM  PATIENT:  Geoffrey FerriesStephen D Mcphee  48 y.o. male  PRE-OPERATIVE DIAGNOSIS:  Biceps tendinitis  POST-OPERATIVE DIAGNOSIS:  Biceps tendinitis  PROCEDURE:  Procedure(s) with comments: BICEPS TENODESIS (Right) - biceps tendon  The operative findings included a scarred biceps tendon just above the bicipital groove tenosynovitis of the tendon  The wound is clean the dressing has been changed. He will come back on postop day #13 for staple removal

## 2014-11-14 ENCOUNTER — Encounter (HOSPITAL_COMMUNITY): Payer: Self-pay | Admitting: Orthopedic Surgery

## 2014-11-20 ENCOUNTER — Other Ambulatory Visit (HOSPITAL_COMMUNITY): Payer: BC Managed Care – PPO

## 2014-11-23 ENCOUNTER — Encounter: Payer: Self-pay | Admitting: Orthopedic Surgery

## 2014-11-23 ENCOUNTER — Ambulatory Visit (INDEPENDENT_AMBULATORY_CARE_PROVIDER_SITE_OTHER): Payer: Self-pay | Admitting: Orthopedic Surgery

## 2014-11-23 VITALS — BP 167/109 | Ht 73.0 in | Wt 264.0 lb

## 2014-11-23 DIAGNOSIS — S46111D Strain of muscle, fascia and tendon of long head of biceps, right arm, subsequent encounter: Secondary | ICD-10-CM

## 2014-11-23 DIAGNOSIS — S46211D Strain of muscle, fascia and tendon of other parts of biceps, right arm, subsequent encounter: Secondary | ICD-10-CM

## 2014-11-23 MED ORDER — OXYCODONE-ACETAMINOPHEN 7.5-325 MG PO TABS: 1.0000 | ORAL_TABLET | ORAL | Status: DC | PRN

## 2014-11-23 NOTE — Progress Notes (Signed)
Patient ID: Geoffrey FerriesStephen D Jackson, male   DOB: 31-Jan-1966, 48 y.o.   MRN: [id number] Chief Complaint  Patient presents with  . Follow-up    post op 2, remove staples, DOS 11/10/14    Biceps tenodesis for biceps tear and previous biceps tenotomy  Patient planes muscle spasm and pain on Percocet 5. Is also on Flexeril. Wound looks good staples removed. Tenderness over the biceps tendon. Recommend Percocet No. 90 7.5 mg every 4 when necessary follow-up 4 weeks okay to return to work on January 4. Okay to remove sling to allow release of the proximal muscle tension

## 2014-11-23 NOTE — Patient Instructions (Addendum)
Remove the sling  Do not lift anything  RTW 11-27-14.

## 2014-11-24 IMAGING — CR DG CHEST 1V PORT
1 series · 1 of 1 positions shown · non-contrast
Comparison: 04/25/2007

CLINICAL DATA: Chest pain and vomiting.

PORTABLE CHEST - 1 VIEW

[view not recorded]
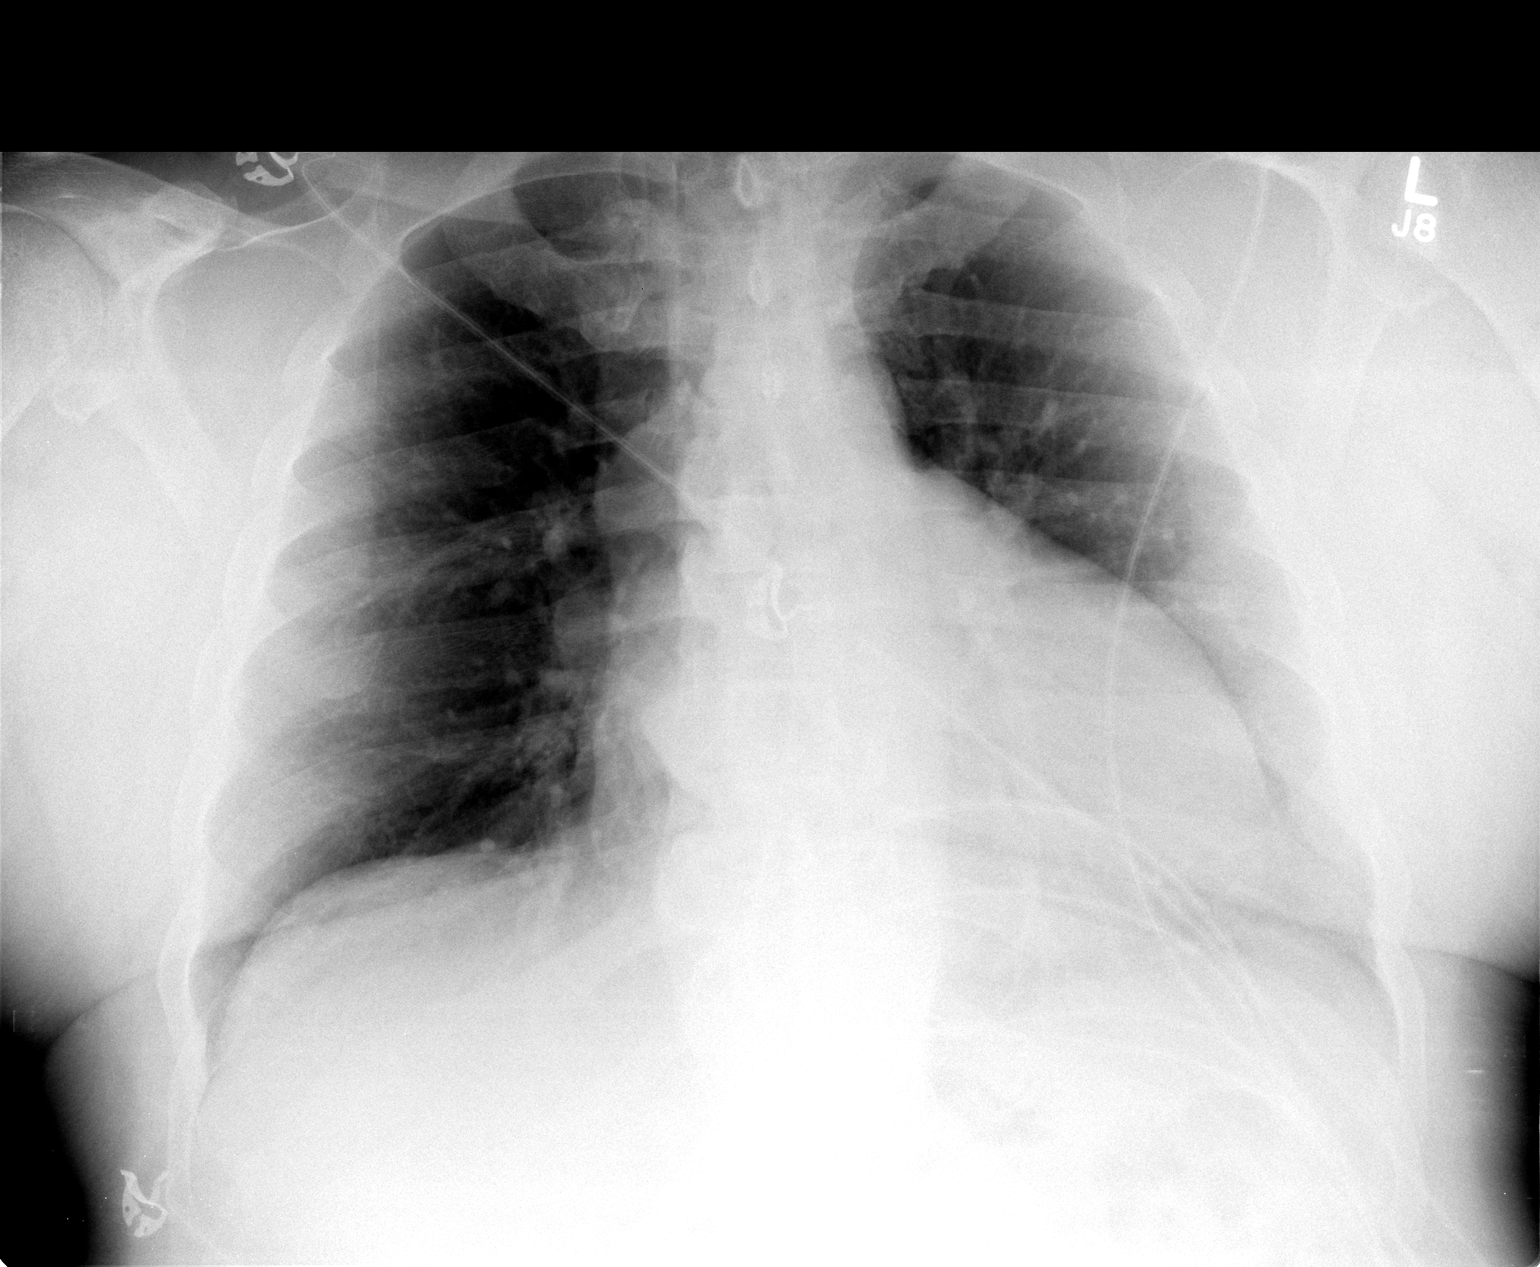

[1 of 1 positions shown; findings below may reference images not displayed]

FINDINGS: Heart size and pulmonary vascularity are normal and the
lungs are clear.  No acute osseous abnormality.
IMPRESSION: No acute disease in the chest.

## 2014-11-25 IMAGING — NM NM MYOCAR SINGLE W/SPECT W/WALL MOTION & EF
1 series · 6 of 6 positions shown · non-contrast
Comparison: none

Ordering Physician: BRUDNY SENK

Kaki Physician: [REDACTED]al Data: 47-year-old gentleman admitted to hospital with
chest discomfort.  Myocardial infarction ruled out.
NUCLEAR MEDICINE STRESS MYOVIEW STUDY WITH SPECT AND LEFT
VENTRICULAR EJECTION FRACTION
Radionuclide Data: One-day rest/stress protocol performed with
[DATE] mCi of Pc-NNm Myoview.
Stress Data: Treadmill exercise performed to a workload of
mets and a heart rate of 129, 74% of age predicted maximum.
Exercise discontinued due to moderate dyspnea and fatigue; no chest
discomfort reported.  Blood pressure increased from a resting value
of 115/75 to 160/100, a normal response; however, pressure
increased to 210/95 in mid recovery.  No arrhythmias noted.
EKG: Normal sinus rhythm; left atrial abnormality; indeterminate
QRS axis; ST-T wave abnormalities consistent with ischemia or LVH,
but limited to lead V6.
Stress EKG: No significant change.
Scintigraphic Data: Acquisition notable for mild movement, more
prominent during the resting portion of the study, mild
diaphragmatic attenuation and mild left ventricular dilatation.  On
tomographic images reconstructed in standard planes, there was a
small defect of borderline numeric significance in the basilar
anteroseptal region.  This was less apparent during the resting
portion of the study indicating a degree of reversibility.  The
gated reconstruction demonstrated borderline global hypokinesis
with a long normal estimated ejection fraction of 50% and normal
systolic accentuation of activity in all myocardial segments.

[cr cardiac tc low dose · 6.41mm/px · 6 of 64 frames shown]
[frame 6/64]
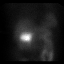
[frame 16/64]
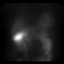
[frame 27/64]
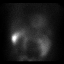
[frame 38/64]
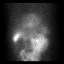
[frame 48/64]
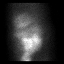
[frame 59/64]
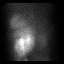

[6 of 6 positions shown; findings below may reference images not displayed]

IMPRESSION: Borderline stress nuclear myocardial study revealing very good
exercise tolerance without symptoms or electrocardiographic changes
to suggest myocardial ischemia, mild left ventricular dilatation,
borderline left ventricular dysfunction and normal myocardial
perfusion except for borderline basilar anteroseptal hypoperfusion
with a degree of reversibility.  This likely reflects technical
issues as described, but a small degree of anterior ischemia cannot
be unequivocally excluded. Mildly hypertensive response limited to
recovery.  Other findings as noted.

## 2014-12-05 ENCOUNTER — Telehealth: Payer: Self-pay | Admitting: Orthopedic Surgery

## 2014-12-05 ENCOUNTER — Other Ambulatory Visit: Payer: Self-pay | Admitting: *Deleted

## 2014-12-05 DIAGNOSIS — S46211D Strain of muscle, fascia and tendon of other parts of biceps, right arm, subsequent encounter: Secondary | ICD-10-CM

## 2014-12-05 MED ORDER — OXYCODONE-ACETAMINOPHEN 7.5-325 MG PO TABS: 1.0000 | ORAL_TABLET | ORAL | Status: DC | PRN

## 2014-12-05 NOTE — Telephone Encounter (Signed)
Routing to Dr Harrison 

## 2014-12-05 NOTE — Telephone Encounter (Signed)
Refill the medication

## 2014-12-05 NOTE — Telephone Encounter (Signed)
Patient called (1) to request refill, pain medication: oxyCODONE-acetaminophen (PERCOCET) 7.5-325 MG per tablet [[id number]][[id number]]  And (2) to relay that he is having some pain in shoulder with raising arm straight up, in doing his home exercises, and wanted to let Dr Romeo AppleHarrison know.  His next scheduled appointment is 12/21/14. Please advise.  Ph# [phone]

## 2014-12-06 ENCOUNTER — Other Ambulatory Visit: Payer: Self-pay | Admitting: *Deleted

## 2014-12-06 DIAGNOSIS — S46211D Strain of muscle, fascia and tendon of other parts of biceps, right arm, subsequent encounter: Secondary | ICD-10-CM

## 2014-12-06 MED ORDER — OXYCODONE-ACETAMINOPHEN 7.5-325 MG PO TABS: 1.0000 | ORAL_TABLET | ORAL | Status: DC | PRN

## 2014-12-21 ENCOUNTER — Ambulatory Visit (INDEPENDENT_AMBULATORY_CARE_PROVIDER_SITE_OTHER): Payer: BC Managed Care – PPO

## 2014-12-21 ENCOUNTER — Encounter: Payer: Self-pay | Admitting: Orthopedic Surgery

## 2014-12-21 ENCOUNTER — Ambulatory Visit (INDEPENDENT_AMBULATORY_CARE_PROVIDER_SITE_OTHER): Payer: Self-pay | Admitting: Orthopedic Surgery

## 2014-12-21 VITALS — HR 76 | Resp 20 | Ht 73.0 in | Wt 264.0 lb

## 2014-12-21 DIAGNOSIS — M79601 Pain in right arm: Secondary | ICD-10-CM

## 2014-12-21 DIAGNOSIS — S46211D Strain of muscle, fascia and tendon of other parts of biceps, right arm, subsequent encounter: Secondary | ICD-10-CM

## 2014-12-21 DIAGNOSIS — S46111D Strain of muscle, fascia and tendon of long head of biceps, right arm, subsequent encounter: Secondary | ICD-10-CM

## 2014-12-21 MED ORDER — OXYCODONE-ACETAMINOPHEN 7.5-325 MG PO TABS: 1.0000 | ORAL_TABLET | ORAL | Status: DC | PRN

## 2014-12-21 NOTE — Patient Instructions (Signed)
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

## 2014-12-22 ENCOUNTER — Encounter: Payer: Self-pay | Admitting: Orthopedic Surgery

## 2014-12-22 NOTE — Progress Notes (Signed)
Patient ID: Rosaria FerriesStephen D Sawtell, male   DOB: [birth year], 49 y.o.   MRN: [id number] Chief Complaint  Patient presents with  . Follow-up    follow up Right biceps tenodesis, DOS 11/10/14    The patient is 6 weeks status post biceps tenodesis right shoulder. He return to work approximately 3 weeks ago. 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 He complains of some occasional tingling in his upper forearm.  His neurovascular exam is otherwise intact his incision is clean dry and intact without erythema  He has 40 of external rotation 30 extension passive range of motion. Passive [place] elevation is 150.  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 We will give him a note to this effect  Will follow-up in a month  Meds ordered this encounter  Medications  . oxyCODONE-acetaminophen (PERCOCET) 7.5-325 MG per tablet    Sig: Take 1 tablet by mouth every 4 (four) hours as needed for pain.    Dispense:  90 tablet    Refill:  0

## 2015-01-04 ENCOUNTER — Telehealth: Payer: Self-pay | Admitting: Orthopedic Surgery

## 2015-01-04 ENCOUNTER — Other Ambulatory Visit: Payer: Self-pay | Admitting: *Deleted

## 2015-01-04 DIAGNOSIS — S46211D Strain of muscle, fascia and tendon of other parts of biceps, right arm, subsequent encounter: Secondary | ICD-10-CM

## 2015-01-04 MED ORDER — OXYCODONE-ACETAMINOPHEN 7.5-325 MG PO TABS: 1.0000 | ORAL_TABLET | ORAL | Status: DC | PRN

## 2015-01-04 NOTE — Telephone Encounter (Signed)
Patient is calling requesting a refill on pain medication oxyCODONE-acetaminophen (PERCOCET) 7.5-325 MG per tablet [[id number]][[id number]] please advise?

## 2015-01-05 NOTE — Telephone Encounter (Signed)
Prescription available, patient aware  

## 2015-01-18 ENCOUNTER — Other Ambulatory Visit: Payer: Self-pay | Admitting: *Deleted

## 2015-01-18 ENCOUNTER — Encounter: Payer: Self-pay | Admitting: Orthopedic Surgery

## 2015-01-18 ENCOUNTER — Ambulatory Visit (INDEPENDENT_AMBULATORY_CARE_PROVIDER_SITE_OTHER): Payer: Self-pay | Admitting: Orthopedic Surgery

## 2015-01-18 DIAGNOSIS — M7521 Bicipital tendinitis, right shoulder: Secondary | ICD-10-CM

## 2015-01-18 DIAGNOSIS — S46111D Strain of muscle, fascia and tendon of long head of biceps, right arm, subsequent encounter: Secondary | ICD-10-CM

## 2015-01-18 DIAGNOSIS — S46211D Strain of muscle, fascia and tendon of other parts of biceps, right arm, subsequent encounter: Secondary | ICD-10-CM

## 2015-01-18 MED ORDER — OXYCODONE-ACETAMINOPHEN 10-325 MG PO TABS: 1.0000 | ORAL_TABLET | Freq: Four times a day (QID) | ORAL | Status: DC | PRN

## 2015-01-18 NOTE — Progress Notes (Signed)
Patient ID: Geoffrey FerriesStephen D Jackson, male   DOB: 03-23-1966, 49 y.o.   MRN: [id number] No chief complaint on file.    status post biceps tenodesis right shoulder  Complains of occasional numbness and tingling on medial aspect of his arm and tenderness along the scar. He has some discomfort when he raises his shoulder or arm above his head   But basically is regained full range of motion he has some tenderness over the medial aspect of the arm over the brachialis and coracobrachialis musculature and possibly over the musculocutaneous nerve.  It is difficult to tell at this point where that is coming from but the tenderness is not over the bicep muscle itself  His external rotation is very good. His wound healed nicely  Recommend Percocet 10 mg every 6 hours when necessary for pain and he has agreed to decrease the dose on his next visit. He wanted to try to do a little bit more rehabilitation and use the same pain medication at this point  Follow up one

## 2015-01-31 ENCOUNTER — Telehealth: Payer: Self-pay | Admitting: Orthopedic Surgery

## 2015-01-31 ENCOUNTER — Other Ambulatory Visit: Payer: Self-pay | Admitting: *Deleted

## 2015-01-31 MED ORDER — OXYCODONE-ACETAMINOPHEN 10-325 MG PO TABS: 1.0000 | ORAL_TABLET | Freq: Four times a day (QID) | ORAL | Status: DC | PRN

## 2015-01-31 NOTE — Telephone Encounter (Signed)
Patient is calling requesting a medication refill on oxyCODONE-acetaminophen (PERCOCET) 10-325 MG per tablet [[id number]][[id number]] please advise?

## 2015-02-01 ENCOUNTER — Other Ambulatory Visit: Payer: Self-pay | Admitting: *Deleted

## 2015-02-01 MED ORDER — OXYCODONE-ACETAMINOPHEN 10-325 MG PO TABS: 1.0000 | ORAL_TABLET | Freq: Four times a day (QID) | ORAL | Status: DC | PRN

## 2015-02-05 NOTE — Telephone Encounter (Signed)
Patient picked up Rx

## 2015-02-05 NOTE — Telephone Encounter (Signed)
Prescription available for pick up, patient aware 

## 2015-02-13 ENCOUNTER — Ambulatory Visit: Payer: BC Managed Care – PPO | Admitting: Orthopedic Surgery

## 2015-02-20 ENCOUNTER — Telehealth: Payer: Self-pay | Admitting: Orthopedic Surgery

## 2015-02-20 NOTE — Telephone Encounter (Signed)
Patient called to request refill of oxyCODONE-acetaminophen (PERCOCET) 10-325 MG per tablet [[id number]][[id number]] - patient ph# [phone]

## 2015-02-22 ENCOUNTER — Other Ambulatory Visit: Payer: Self-pay | Admitting: *Deleted

## 2015-02-22 DIAGNOSIS — S46211D Strain of muscle, fascia and tendon of other parts of biceps, right arm, subsequent encounter: Secondary | ICD-10-CM

## 2015-02-22 MED ORDER — OXYCODONE-ACETAMINOPHEN 7.5-325 MG PO TABS: 1.0000 | ORAL_TABLET | ORAL | Status: DC | PRN

## 2015-02-26 NOTE — Telephone Encounter (Signed)
Prescription available, patient aware  

## 2015-03-01 ENCOUNTER — Ambulatory Visit (INDEPENDENT_AMBULATORY_CARE_PROVIDER_SITE_OTHER): Payer: BC Managed Care – PPO | Admitting: Orthopedic Surgery

## 2015-03-01 VITALS — BP 167/104 | Ht 73.0 in | Wt 264.0 lb

## 2015-03-01 DIAGNOSIS — G5691 Unspecified mononeuropathy of right upper limb: Secondary | ICD-10-CM

## 2015-03-01 DIAGNOSIS — S46111D Strain of muscle, fascia and tendon of long head of biceps, right arm, subsequent encounter: Secondary | ICD-10-CM | POA: Diagnosis not present

## 2015-03-01 DIAGNOSIS — S46211D Strain of muscle, fascia and tendon of other parts of biceps, right arm, subsequent encounter: Secondary | ICD-10-CM

## 2015-03-01 NOTE — Patient Instructions (Signed)
We will refer you for nerve conduction study of right arm  Schedule appointment to review with us after study is done

## 2015-03-03 ENCOUNTER — Encounter: Payer: Self-pay | Admitting: Orthopedic Surgery

## 2015-03-03 NOTE — Progress Notes (Signed)
Patient ID: Rosaria FerriesStephen D Mcmahen, male   DOB: 1966/11/01, 49 y.o.   MRN: [id number] Chief Complaint  Patient presents with  . Follow-up    1 mnth follow up right bicep tendonesis, DOS 11/10/14    Steven's had biceps tenodesis of the right shoulder amongst other right shoulder procedures including arthroscopic debridement and separate open rotator cuff repair.  His most recent surgery was a biceps tenodesis and he is finally relieved of 95% of the pain over the biceps tendon.  His problems now relate to a feeling of a dead arm with occasional numbness and tingling in the right upper extremity. He did have neck procedure in the past but denies any neck pain  The exam today revealed produced no Spurling's test findings but he did have tenderness over the right trapezius muscle which reproduces some of his pain he could have a cervical triangle syndrome or some other type of neuropathy in his right upper extremity. His shoulder has mild tenderness at the proximal aspect of the of the incision but this is very minor he has some clicking on abduction external rotation but it is pain lasts.  His biceps tendon distally is normal he has good strength in his hand his rotator cuff strength is excellent and his range of motion is normal  Vital signs are stable as recorded his appearance remains normal well-groomed and well-developed with no distress  Recommend nerve conduction study I think his shoulder problems are 95% better and this is probably maximal medical improvement regarding that.  He is on Percocet for pain he is in the midst of a weaning process

## 2015-03-12 ENCOUNTER — Other Ambulatory Visit: Payer: Self-pay | Admitting: *Deleted

## 2015-03-12 ENCOUNTER — Telehealth: Payer: Self-pay | Admitting: *Deleted

## 2015-03-12 DIAGNOSIS — R200 Anesthesia of skin: Secondary | ICD-10-CM

## 2015-03-12 NOTE — Telephone Encounter (Signed)
[facility]

## 2015-03-14 ENCOUNTER — Telehealth: Payer: Self-pay | Admitting: Orthopedic Surgery

## 2015-03-14 NOTE — Telephone Encounter (Signed)
Patient is calling requesting a refill on pain medication oxyCODONE-acetaminophen (PERCOCET) 10-325 MG per tablet  Please advise?

## 2015-03-18 NOTE — Telephone Encounter (Signed)
Change to percocet 7.5 mg q 6  # 56   Send letter with prescription

## 2015-03-19 ENCOUNTER — Other Ambulatory Visit: Payer: Self-pay | Admitting: *Deleted

## 2015-03-19 MED ORDER — OXYCODONE-ACETAMINOPHEN 7.5-325 MG PO TABS: 1.0000 | ORAL_TABLET | Freq: Four times a day (QID) | ORAL | Status: DC | PRN

## 2015-03-19 NOTE — Telephone Encounter (Signed)
Prescription available, patient aware  

## 2015-03-19 NOTE — Telephone Encounter (Signed)
Patient picked up Rx

## 2015-03-20 NOTE — Telephone Encounter (Signed)
[facility] 10:[facility]  (LATER APPT DUE TO SCHOOL SCHEDULE)

## 2015-03-28 ENCOUNTER — Telehealth: Payer: Self-pay | Admitting: Orthopedic Surgery

## 2015-03-28 ENCOUNTER — Other Ambulatory Visit: Payer: Self-pay | Admitting: *Deleted

## 2015-03-28 MED ORDER — OXYCODONE-ACETAMINOPHEN 5-325 MG PO TABS: 1.0000 | ORAL_TABLET | Freq: Four times a day (QID) | ORAL | Status: DC | PRN

## 2015-03-28 NOTE — Telephone Encounter (Signed)
Patient called to request refill on:  oxyCODONE-acetaminophen (PERCOCET) 7.5-325 MG per tablet [[id number]][[id number]] - ph# [phone]

## 2015-03-29 NOTE — Telephone Encounter (Signed)
Prescription available, called patient, no answer 

## 2015-03-29 NOTE — Telephone Encounter (Signed)
Patient picked up Rx

## 2015-05-01 ENCOUNTER — Other Ambulatory Visit: Payer: Self-pay | Admitting: *Deleted

## 2015-05-01 ENCOUNTER — Other Ambulatory Visit: Payer: Self-pay | Admitting: Orthopedic Surgery

## 2015-05-01 ENCOUNTER — Telehealth: Payer: Self-pay | Admitting: Orthopedic Surgery

## 2015-05-01 MED ORDER — OXYCODONE-ACETAMINOPHEN 5-325 MG PO TABS: 1.0000 | ORAL_TABLET | Freq: Four times a day (QID) | ORAL | Status: DC | PRN

## 2015-05-01 MED ORDER — OXYCODONE-ACETAMINOPHEN 5-325 MG PO TABS: 1.0000 | ORAL_TABLET | Freq: Three times a day (TID) | ORAL | Status: DC | PRN

## 2015-05-01 NOTE — Telephone Encounter (Signed)
Patient is calling requesting a medication refill on oxyCODONE-acetaminophen (PERCOCET/ROXICET) 5-325 MG per tablet please advise?

## 2015-05-01 NOTE — Telephone Encounter (Signed)
Prescription available, patient aware  

## 2015-05-02 NOTE — Telephone Encounter (Signed)
PATIENT PICKED UP RX 

## 2015-05-22 ENCOUNTER — Other Ambulatory Visit: Payer: Self-pay | Admitting: *Deleted

## 2015-05-22 ENCOUNTER — Telehealth: Payer: Self-pay | Admitting: Orthopedic Surgery

## 2015-05-22 NOTE — Telephone Encounter (Signed)
Patient is calling requesting a refill on medication oxyCODONE-acetaminophen (ROXICET) 5-325 MG per tablet please advise?

## 2015-05-22 NOTE — Telephone Encounter (Signed)
Not due until next thursday

## 2015-05-29 ENCOUNTER — Other Ambulatory Visit: Payer: Self-pay | Admitting: *Deleted

## 2015-05-29 MED ORDER — OXYCODONE-ACETAMINOPHEN 5-325 MG PO TABS: 1.0000 | ORAL_TABLET | Freq: Three times a day (TID) | ORAL | Status: DC | PRN

## 2015-05-30 NOTE — Telephone Encounter (Signed)
Patient Picked up Rx 

## 2015-05-30 NOTE — Telephone Encounter (Signed)
Prescription available 

## 2015-05-30 NOTE — Telephone Encounter (Signed)
Rx is available for pick up, left message on voicemail

## 2015-06-15 ENCOUNTER — Emergency Department (HOSPITAL_COMMUNITY): Payer: BC Managed Care – PPO

## 2015-06-15 ENCOUNTER — Observation Stay (INDEPENDENT_AMBULATORY_CARE_PROVIDER_SITE_OTHER): Payer: BC Managed Care – PPO

## 2015-06-15 ENCOUNTER — Inpatient Hospital Stay (HOSPITAL_COMMUNITY)
Admission: EM | Admit: 2015-06-15 | Discharge: 2015-06-19 | DRG: 195 | Disposition: A | Discharge status: Home / Self Care | Payer: BC Managed Care – PPO | Attending: Family Medicine | Admitting: Family Medicine

## 2015-06-15 ENCOUNTER — Encounter (HOSPITAL_COMMUNITY): Payer: Self-pay

## 2015-06-15 DIAGNOSIS — R0902 Hypoxemia: Secondary | ICD-10-CM

## 2015-06-15 DIAGNOSIS — R7989 Other specified abnormal findings of blood chemistry: Secondary | ICD-10-CM | POA: Diagnosis not present

## 2015-06-15 DIAGNOSIS — J9801 Acute bronchospasm: Secondary | ICD-10-CM

## 2015-06-15 DIAGNOSIS — E1169 Type 2 diabetes mellitus with other specified complication: Secondary | ICD-10-CM

## 2015-06-15 DIAGNOSIS — K2100 Gastro-esophageal reflux disease with esophagitis, without bleeding: Secondary | ICD-10-CM

## 2015-06-15 DIAGNOSIS — R079 Chest pain, unspecified: Secondary | ICD-10-CM | POA: Diagnosis not present

## 2015-06-15 DIAGNOSIS — I2510 Atherosclerotic heart disease of native coronary artery without angina pectoris: Secondary | ICD-10-CM | POA: Diagnosis present

## 2015-06-15 DIAGNOSIS — J181 Lobar pneumonia, unspecified organism: Principal | ICD-10-CM | POA: Diagnosis present

## 2015-06-15 DIAGNOSIS — I209 Angina pectoris, unspecified: Secondary | ICD-10-CM

## 2015-06-15 DIAGNOSIS — R0602 Shortness of breath: Secondary | ICD-10-CM

## 2015-06-15 DIAGNOSIS — I10 Essential (primary) hypertension: Secondary | ICD-10-CM

## 2015-06-15 DIAGNOSIS — E669 Obesity, unspecified: Secondary | ICD-10-CM

## 2015-06-15 DIAGNOSIS — R778 Other specified abnormalities of plasma proteins: Secondary | ICD-10-CM

## 2015-06-15 DIAGNOSIS — E119 Type 2 diabetes mellitus without complications: Secondary | ICD-10-CM | POA: Diagnosis present

## 2015-06-15 DIAGNOSIS — J189 Pneumonia, unspecified organism: Secondary | ICD-10-CM | POA: Diagnosis not present

## 2015-06-15 DIAGNOSIS — E780 Pure hypercholesterolemia: Secondary | ICD-10-CM | POA: Diagnosis present

## 2015-06-15 DIAGNOSIS — Z6834 Body mass index (BMI) 34.0-34.9, adult: Secondary | ICD-10-CM

## 2015-06-15 DIAGNOSIS — K219 Gastro-esophageal reflux disease without esophagitis: Secondary | ICD-10-CM | POA: Diagnosis present

## 2015-06-15 DIAGNOSIS — E785 Hyperlipidemia, unspecified: Secondary | ICD-10-CM

## 2015-06-15 DIAGNOSIS — Z794 Long term (current) use of insulin: Secondary | ICD-10-CM

## 2015-06-15 HISTORY — DX: Obesity, unspecified: E66.9

## 2015-06-15 LAB — COMPREHENSIVE METABOLIC PANEL
ALBUMIN: 4.2 g/dL (ref 3.5–5.0)
ALK PHOS: 83 U/L (ref 38–126)
ALT: 39 U/L (ref 17–63)
AST: 26 U/L (ref 15–41)
Anion gap: 10 (ref 5–15)
BUN: 15 mg/dL (ref 6–20)
CO2: 24 mmol/L (ref 22–32)
Calcium: 8.8 mg/dL — ABNORMAL LOW (ref 8.9–10.3)
Chloride: 107 mmol/L (ref 101–111)
Creatinine, Ser: 1.24 mg/dL (ref 0.61–1.24)
GFR calc non Af Amer: >60 mL/min (ref >60)
GLUCOSE: 227 mg/dL — AB (ref 65–99)
POTASSIUM: 3.2 mmol/L — AB (ref 3.5–5.1)
Sodium: 141 mmol/L (ref 135–145)
TOTAL PROTEIN: 7.5 g/dL (ref 6.5–8.1)
Total Bilirubin: 0.6 mg/dL (ref 0.3–1.2)

## 2015-06-15 LAB — CBC WITH DIFFERENTIAL/PLATELET
BASOS ABS: 0.1 10*3/uL (ref 0.0–0.1)
Basophils Relative: 1 % (ref 0–1)
EOS ABS: 0.1 10*3/uL (ref 0.0–0.7)
Eosinophils Relative: 1 % (ref 0–5)
HCT: 46.8 % (ref 39.0–52.0)
Hemoglobin: 15.4 g/dL (ref 13.0–17.0)
LYMPHS PCT: 50 % — AB (ref 12–46)
Lymphs Abs: 3.6 10*3/uL (ref 0.7–4.0)
MCH: 30.6 pg (ref 26.0–34.0)
MCHC: 32.9 g/dL (ref 30.0–36.0)
MCV: 93 fL (ref 78.0–100.0)
MONO ABS: 0.5 10*3/uL (ref 0.1–1.0)
Monocytes Relative: 7 % (ref 3–12)
NEUTROS ABS: 3 10*3/uL (ref 1.7–7.7)
NEUTROS PCT: 41 % — AB (ref 43–77)
PLATELETS: 205 10*3/uL (ref 150–400)
RBC: 5.03 MIL/uL (ref 4.22–5.81)
RDW: 13.5 % (ref 11.5–15.5)
WBC: 7.3 10*3/uL (ref 4.0–10.5)

## 2015-06-15 LAB — LIPID PANEL
Cholesterol: 207 mg/dL — ABNORMAL HIGH (ref 0–200)
HDL: 38 mg/dL — ABNORMAL LOW (ref >40)
LDL Cholesterol: 147 mg/dL — ABNORMAL HIGH (ref 0–99)
Total CHOL/HDL Ratio: 5.4 RATIO
Triglycerides: 110 mg/dL (ref <150)
VLDL: 22 mg/dL (ref 0–40)

## 2015-06-15 LAB — GLUCOSE, CAPILLARY
GLUCOSE-CAPILLARY: 214 mg/dL — AB (ref 65–99)
Glucose-Capillary: 292 mg/dL — ABNORMAL HIGH (ref 65–99)
Glucose-Capillary: 201 mg/dL — ABNORMAL HIGH (ref 65–99)

## 2015-06-15 LAB — URINALYSIS, ROUTINE W REFLEX MICROSCOPIC
Bilirubin Urine: NEGATIVE
Glucose, UA: >1000 mg/dL — AB
HGB URINE DIPSTICK: NEGATIVE
Ketones, ur: NEGATIVE mg/dL
Leukocytes, UA: NEGATIVE
Nitrite: NEGATIVE
Protein, ur: NEGATIVE mg/dL
SPECIFIC GRAVITY, URINE: 1.01 (ref 1.005–1.030)
UROBILINOGEN UA: 0.2 mg/dL (ref 0.0–1.0)
pH: 6.5 (ref 5.0–8.0)

## 2015-06-15 LAB — TROPONIN I
Troponin I: 0.05 ng/mL — ABNORMAL HIGH (ref <0.031)
Troponin I: 0.05 ng/mL — ABNORMAL HIGH (ref <0.031)

## 2015-06-15 LAB — BRAIN NATRIURETIC PEPTIDE: B Natriuretic Peptide: 158 pg/mL — ABNORMAL HIGH (ref 0.0–100.0)

## 2015-06-15 LAB — URINE MICROSCOPIC-ADD ON

## 2015-06-15 LAB — D-DIMER, QUANTITATIVE (NOT AT ARMC): D DIMER QUANT: 0.34 ug{FEU}/mL (ref 0.00–0.48)

## 2015-06-15 LAB — MAGNESIUM: MAGNESIUM: 1.7 mg/dL (ref 1.7–2.4)

## 2015-06-15 LAB — STREP PNEUMONIAE URINARY ANTIGEN: Strep Pneumo Urinary Antigen: NEGATIVE

## 2015-06-15 MED ORDER — MORPHINE SULFATE 2 MG/ML IJ SOLN
1.0000 mg | INTRAMUSCULAR | Status: DC | PRN
  Administered 2015-06-16 – 2015-06-17 (×3): 1 mg via INTRAVENOUS
  Filled 2015-06-15 (×2): qty 1

## 2015-06-15 MED ORDER — ASPIRIN EC 325 MG PO TBEC
325.0000 mg | DELAYED_RELEASE_TABLET | Freq: Every day | ORAL | Status: DC
  Administered 2015-06-15 – 2015-06-19 (×5): 325 mg via ORAL
  Filled 2015-06-15 (×5): qty 1

## 2015-06-15 MED ORDER — ACETAMINOPHEN 650 MG RE SUPP: 650.0000 mg | Freq: Four times a day (QID) | RECTAL | Status: DC | PRN

## 2015-06-15 MED ORDER — ALBUTEROL (5 MG/ML) CONTINUOUS INHALATION SOLN
15.0000 mg/h | INHALATION_SOLUTION | Freq: Once | RESPIRATORY_TRACT | Status: AC
  Administered 2015-06-15: 15 mg/h via RESPIRATORY_TRACT
  Filled 2015-06-15: qty 20

## 2015-06-15 MED ORDER — NEBIVOLOL HCL 10 MG PO TABS
5.0000 mg | ORAL_TABLET | Freq: Every day | ORAL | Status: DC
  Administered 2015-06-15 – 2015-06-19 (×5): 5 mg via ORAL
  Filled 2015-06-15 (×5): qty 1

## 2015-06-15 MED ORDER — POTASSIUM CHLORIDE CRYS ER 20 MEQ PO TBCR
40.0000 meq | EXTENDED_RELEASE_TABLET | Freq: Once | ORAL | Status: AC
  Administered 2015-06-15: 40 meq via ORAL
  Filled 2015-06-15: qty 2

## 2015-06-15 MED ORDER — HYDROCODONE-ACETAMINOPHEN 5-325 MG PO TABS
1.0000 | ORAL_TABLET | ORAL | Status: DC | PRN
  Administered 2015-06-15: 1 via ORAL
  Administered 2015-06-16 – 2015-06-19 (×11): 2 via ORAL
  Filled 2015-06-15: qty 2
  Filled 2015-06-15: qty 1
  Filled 2015-06-15 (×10): qty 2

## 2015-06-15 MED ORDER — INSULIN ASPART 100 UNIT/ML ~~LOC~~ SOLN
0.0000 [IU] | Freq: Every day | SUBCUTANEOUS | Status: DC
  Administered 2015-06-15 – 2015-06-17 (×2): 2 [IU] via SUBCUTANEOUS

## 2015-06-15 MED ORDER — DEXTROSE 5 % IV SOLN
500.0000 mg | INTRAVENOUS | Status: DC
  Administered 2015-06-15: 500 mg via INTRAVENOUS
  Filled 2015-06-15 (×4): qty 500

## 2015-06-15 MED ORDER — ALUM & MAG HYDROXIDE-SIMETH 200-200-20 MG/5ML PO SUSP: 30.0000 mL | Freq: Four times a day (QID) | ORAL | Status: DC | PRN

## 2015-06-15 MED ORDER — DEXTROSE 5 % IV SOLN
1.0000 g | Freq: Once | INTRAVENOUS | Status: AC
  Administered 2015-06-15: 1 g via INTRAVENOUS
  Filled 2015-06-15: qty 10

## 2015-06-15 MED ORDER — CLONIDINE HCL 0.2 MG PO TABS
0.2000 mg | ORAL_TABLET | Freq: Two times a day (BID) | ORAL | Status: DC
  Administered 2015-06-15 – 2015-06-19 (×9): 0.2 mg via ORAL
  Filled 2015-06-15 (×9): qty 1

## 2015-06-15 MED ORDER — ENOXAPARIN SODIUM 40 MG/0.4ML ~~LOC~~ SOLN
40.0000 mg | SUBCUTANEOUS | Status: DC
  Administered 2015-06-15 – 2015-06-19 (×5): 40 mg via SUBCUTANEOUS
  Filled 2015-06-15 (×5): qty 0.4

## 2015-06-15 MED ORDER — INSULIN ASPART 100 UNIT/ML ~~LOC~~ SOLN
0.0000 [IU] | Freq: Three times a day (TID) | SUBCUTANEOUS | Status: DC
  Administered 2015-06-15: 8 [IU] via SUBCUTANEOUS
  Administered 2015-06-15 – 2015-06-16 (×2): 5 [IU] via SUBCUTANEOUS
  Administered 2015-06-16 – 2015-06-17 (×3): 3 [IU] via SUBCUTANEOUS
  Administered 2015-06-17: 5 [IU] via SUBCUTANEOUS
  Administered 2015-06-17: 3 [IU] via SUBCUTANEOUS
  Administered 2015-06-18: 2 [IU] via SUBCUTANEOUS
  Administered 2015-06-18: 3 [IU] via SUBCUTANEOUS
  Administered 2015-06-18: 5 [IU] via SUBCUTANEOUS
  Administered 2015-06-19: 8 [IU] via SUBCUTANEOUS
  Administered 2015-06-19: 5 [IU] via SUBCUTANEOUS

## 2015-06-15 MED ORDER — SODIUM CHLORIDE 0.9 % IV SOLN
250.0000 mL | INTRAVENOUS | Status: DC | PRN
Start: 2015-06-15 — End: 2015-06-19

## 2015-06-15 MED ORDER — ONDANSETRON HCL 4 MG/2ML IJ SOLN: 4.0000 mg | Freq: Four times a day (QID) | INTRAMUSCULAR | Status: DC | PRN

## 2015-06-15 MED ORDER — ASPIRIN EC 325 MG PO TBEC: 325.0000 mg | DELAYED_RELEASE_TABLET | Freq: Every day | ORAL | Status: DC

## 2015-06-15 MED ORDER — IPRATROPIUM-ALBUTEROL 0.5-2.5 (3) MG/3ML IN SOLN
3.0000 mL | Freq: Once | RESPIRATORY_TRACT | Status: AC
  Administered 2015-06-15: 3 mL via RESPIRATORY_TRACT
  Filled 2015-06-15: qty 3

## 2015-06-15 MED ORDER — ALBUTEROL SULFATE (2.5 MG/3ML) 0.083% IN NEBU: 2.5000 mg | INHALATION_SOLUTION | RESPIRATORY_TRACT | Status: AC | PRN

## 2015-06-15 MED ORDER — INSULIN GLARGINE 100 UNIT/ML ~~LOC~~ SOLN
30.0000 [IU] | Freq: Every day | SUBCUTANEOUS | Status: DC
  Administered 2015-06-15 – 2015-06-18 (×4): 30 [IU] via SUBCUTANEOUS
  Filled 2015-06-15 (×5): qty 0.3

## 2015-06-15 MED ORDER — TRAZODONE HCL 50 MG PO TABS
25.0000 mg | ORAL_TABLET | Freq: Every evening | ORAL | Status: DC | PRN
  Administered 2015-06-16: 25 mg via ORAL
  Filled 2015-06-15: qty 1

## 2015-06-15 MED ORDER — DEXTROSE 5 % IV SOLN
2.0000 g | INTRAVENOUS | Status: DC
  Administered 2015-06-16 – 2015-06-19 (×4): 2 g via INTRAVENOUS
  Filled 2015-06-15 (×5): qty 2

## 2015-06-15 MED ORDER — SODIUM CHLORIDE 0.9 % IJ SOLN
3.0000 mL | Freq: Two times a day (BID) | INTRAMUSCULAR | Status: DC
  Administered 2015-06-15 – 2015-06-19 (×4): 3 mL via INTRAVENOUS

## 2015-06-15 MED ORDER — POTASSIUM CHLORIDE CRYS ER 20 MEQ PO TBCR: 40.0000 meq | EXTENDED_RELEASE_TABLET | ORAL | Status: DC

## 2015-06-15 MED ORDER — IPRATROPIUM BROMIDE 0.02 % IN SOLN
0.5000 mg | Freq: Once | RESPIRATORY_TRACT | Status: AC
  Administered 2015-06-15: 0.5 mg via RESPIRATORY_TRACT
  Filled 2015-06-15: qty 2.5

## 2015-06-15 MED ORDER — ALBUTEROL SULFATE (2.5 MG/3ML) 0.083% IN NEBU
2.5000 mg | INHALATION_SOLUTION | Freq: Once | RESPIRATORY_TRACT | Status: AC
  Administered 2015-06-15: 2.5 mg via RESPIRATORY_TRACT
  Filled 2015-06-15: qty 3

## 2015-06-15 MED ORDER — AZITHROMYCIN 500 MG IV SOLR
500.0000 mg | INTRAVENOUS | Status: DC
  Administered 2015-06-16 – 2015-06-19 (×4): 500 mg via INTRAVENOUS
  Filled 2015-06-15 (×5): qty 500

## 2015-06-15 MED ORDER — SODIUM CHLORIDE 0.9 % IJ SOLN: 3.0000 mL | INTRAMUSCULAR | Status: DC | PRN

## 2015-06-15 MED ORDER — SODIUM CHLORIDE 0.9 % IJ SOLN
3.0000 mL | Freq: Two times a day (BID) | INTRAMUSCULAR | Status: DC
  Administered 2015-06-18: 3 mL via INTRAVENOUS

## 2015-06-15 MED ORDER — NITROGLYCERIN 0.4 MG SL SUBL: 0.4000 mg | SUBLINGUAL_TABLET | SUBLINGUAL | Status: DC | PRN

## 2015-06-15 MED ORDER — ACETAMINOPHEN 325 MG PO TABS: 650.0000 mg | ORAL_TABLET | Freq: Four times a day (QID) | ORAL | Status: DC | PRN

## 2015-06-15 MED ORDER — IRBESARTAN 150 MG PO TABS
150.0000 mg | ORAL_TABLET | Freq: Every day | ORAL | Status: DC
  Administered 2015-06-15 – 2015-06-16 (×2): 150 mg via ORAL
  Filled 2015-06-15 (×2): qty 1

## 2015-06-15 MED ORDER — PRAVASTATIN SODIUM 40 MG PO TABS
40.0000 mg | ORAL_TABLET | Freq: Every day | ORAL | Status: DC
  Administered 2015-06-15 – 2015-06-19 (×5): 40 mg via ORAL
  Filled 2015-06-15 (×5): qty 1

## 2015-06-15 MED ORDER — ONDANSETRON HCL 4 MG PO TABS: 4.0000 mg | ORAL_TABLET | Freq: Four times a day (QID) | ORAL | Status: DC | PRN

## 2015-06-15 NOTE — Progress Notes (Signed)
ANTIBIOTIC CONSULT NOTE  Pharmacy Consult for Rocephin Indication: pneumonia  Allergies  Allergen Reactions  . Demerol [Meperidine] Itching  . Lansoprazole Other (See Comments)    unknown   Patient Measurements: Height:  (185.4 cm) Weight: 260 lb (117.935 kg) IBW/kg (Calculated) : 79.9  Vital Signs: Temp: 98.5 F (36.9 C) (07/22 0628) Temp Source: Oral (07/22 0628) BP: 141/90 mmHg (07/22 0930) Pulse Rate: 79 (07/22 0930) Intake/Output from previous day:   Intake/Output from this shift:   Labs:  Recent Labs  06/15/15 0710  WBC 7.3  HGB 15.4  PLT 205  CREATININE 1.24   Estimated Creatinine Clearance: 96.9 mL/min (by C-G formula based on Cr of 1.24). No results for input(s): VANCOTROUGH, VANCOPEAK, VANCORANDOM, GENTTROUGH, GENTPEAK, GENTRANDOM, TOBRATROUGH, TOBRAPEAK, TOBRARND, AMIKACINPEAK, AMIKACINTROU, AMIKACIN in the last 72 hours.   Microbiology: No results found for this or any previous visit (from the past 720 hour(s)).  Anti-infectives    Start     Dose/Rate Route Frequency Ordered Stop   06/16/15 0900  azithromycin (ZITHROMAX) 500 mg in dextrose 5 % 250 mL IVPB     500 mg 250 mL/hr over 60 Minutes Intravenous Every 24 hours 06/15/15 1012     06/15/15 0730  cefTRIAXone (ROCEPHIN) 1 g in dextrose 5 % 50 mL IVPB     1 g 100 mL/hr over 30 Minutes Intravenous  Once 06/15/15 0718 06/15/15 0821   06/15/15 0730  azithromycin (ZITHROMAX) 500 mg in dextrose 5 % 250 mL IVPB  Status:  Discontinued     500 mg 250 mL/hr over 60 Minutes Intravenous Every 24 hours 06/15/15 0718 06/15/15 1012     Assessment: Okay for Protocol, initial dose given in ED.  Obese male being treated for CAP.  Goal of Therapy:  Eradicate infection.   Plan:  Rocephin 2gm IV every 24 hours. Dose stable for age, weight, renal function and indication. No pharmacokinetic monitoring needed. Sign off.  Lamonte Richer R 06/15/2015,10:28 AM

## 2015-06-15 NOTE — Progress Notes (Signed)
*  PRELIMINARY RESULTS* Echocardiogram 2D Echocardiogram has been performed.  Geoffrey Jackson 06/15/2015, 1:47 PM

## 2015-06-15 NOTE — ED Provider Notes (Signed)
Patient signed out to me by Dr. Lynelle Doctor to follow-up on results. Patient was seen overnight after he developed left-sided chest pain while having sexual intercourse. Patient reports shortness of breath associated with the symptoms. He reports that it felt like there was a knot in the rib cage and the symptoms went up into the throat making him feel short of breath. This is the second time this has happened during sexual intercourse. He does not have any history of heart disease or lung disease.  Does have multiple cardiac risk factors. He has hypertension, hyperlipidemia, diabetes, obesity.  Workup shows evidence of pneumonia. Patient does report some cough but no fever. Patient has a borderline troponin 0.05. EKG with nonspecific changes, no clear evidence of ischemia and no MI. BNP is 158.  Patient continues to have pain in the left midaxillary chest wall area. There is some tenderness to touch. Pain might be secondary to pneumonia. Cannot rule out cardiac etiology as well, as this began with exertion and he does have a borderline troponin. Patient will require hospitalization for serial troponins, treatment for pneumonia.  Results for orders placed or performed during the hospital encounter of 06/15/15  Comprehensive metabolic panel  Result Value Ref Range   Sodium 141 135 - 145 mmol/L   Potassium 3.2 (L) 3.5 - 5.1 mmol/L   Chloride 107 101 - 111 mmol/L   CO2 24 22 - 32 mmol/L   Glucose, Bld 227 (H) 65 - 99 mg/dL   BUN 15 6 - 20 mg/dL   Creatinine, Ser 1.61 0.61 - 1.24 mg/dL   Calcium 8.8 (L) 8.9 - 10.3 mg/dL   Total Protein 7.5 6.5 - 8.1 g/dL   Albumin 4.2 3.5 - 5.0 g/dL   AST 26 15 - 41 U/L   ALT 39 17 - 63 U/L   Alkaline Phosphatase 83 38 - 126 U/L   Total Bilirubin 0.6 0.3 - 1.2 mg/dL   GFR calc non Af Amer >60 >60 mL/min   GFR calc Af Amer >60 >60 mL/min   Anion gap 10 5 - 15  CBC with Differential  Result Value Ref Range   WBC 7.3 4.0 - 10.5 K/uL   RBC 5.03 4.22 - 5.81 MIL/uL   Hemoglobin 15.4 13.0 - 17.0 g/dL   HCT 09.6 04.5 - 40.9 %   MCV 93.0 78.0 - 100.0 fL   MCH 30.6 26.0 - 34.0 pg   MCHC 32.9 30.0 - 36.0 g/dL   RDW 81.1 91.4 - 78.2 %   Platelets 205 150 - 400 K/uL   Neutrophils Relative % 41 (L) 43 - 77 %   Lymphocytes Relative 50 (H) 12 - 46 %   Monocytes Relative 7 3 - 12 %   Eosinophils Relative 1 0 - 5 %   Basophils Relative 1 0 - 1 %   Neutro Abs 3.0 1.7 - 7.7 K/uL   Lymphs Abs 3.6 0.7 - 4.0 K/uL   Monocytes Absolute 0.5 0.1 - 1.0 K/uL   Eosinophils Absolute 0.1 0.0 - 0.7 K/uL   Basophils Absolute 0.1 0.0 - 0.1 K/uL   WBC Morphology ATYPICAL LYMPHOCYTES   Troponin I  Result Value Ref Range   Troponin I 0.05 (H) <0.031 ng/mL  Brain natriuretic peptide  Result Value Ref Range   B Natriuretic Peptide 158.0 (H) 0.0 - 100.0 pg/mL  D-dimer, quantitative  Result Value Ref Range   D-Dimer, Quant 0.34 0.00 - 0.48 ug/mL-FEU   Dg Chest 2 View  06/15/2015  CLINICAL DATA:  Shortness of breath.  Chest pain.  EXAM: CHEST  2 VIEW  COMPARISON:  06/15/2015.  FINDINGS: Mediastinum hilar structures normal . Heart size normal. Pulmonary vascularity normal. Patchy right lower lobe and left mid lung field infiltrates again noted as previously described. No pleural effusion or pneumothorax. No acute bony abnormality. Degenerative changes thoracic spine  IMPRESSION: Patchy right lower lobe and left mid lung field infiltrates noted most consistent with pneumonia again noted. Followup PA and lateral chest X-ray is recommended in 3-4 weeks following trial of antibiotic therapy to ensure resolution and exclude underlying malignancy.   Electronically Signed   By: Maisie Fus  Register   On: 06/15/2015 08:44   Dg Chest Port 1 View  06/15/2015   CLINICAL DATA:  Shortness of breath, cough, and left chest pain.  EXAM: PORTABLE CHEST - 1 VIEW  COMPARISON:  03/02/2014  FINDINGS: Cardiac silhouette is upper limits of normal in size. There are new patchy opacities in the left midlung and  right lung base. No pleural effusion or pneumothorax is identified. No acute osseous abnormality is seen.  IMPRESSION: Patchy left midlung and right basilar opacity suspicious for pneumonia. Followup PA and lateral chest X-ray is recommended in 3-4 weeks following trial of antibiotic therapy to ensure resolution and exclude underlying malignancy.   Electronically Signed   By: Sebastian Ache   On: 06/15/2015 07:09      [name], MD 06/15/15 [phone]

## 2015-06-15 NOTE — ED Notes (Signed)
SOB and left rib pain onset this am, states he feels like throat is closing

## 2015-06-15 NOTE — H&P (Signed)
[facility] History and Physical  Geoffrey Jackson ZOX:[id number] DOB: [birth year] DOA: 06/15/2015  Referring physician: Blinda Leatherwood PCP: Isabella Stalling, MD   Chief Complaint: chest pain  HPI: Geoffrey Jackson is a 49 y.o. male past medical history that includes hypertension, cholesterol, diabetes, obesity presents to the emergency department with chief complaint of chest pain. Initial evaluation reveals a chest x-ray concerning for pneumonia and mildly elevated troponin.  Patient reports last night while having sex he developed sudden onset left sided chest pain shortness of breath during climax. He describes the pain as sharp constant located "left rib cage going up to my throat". Associated symptoms include diaphoresis, nausea without vomiting. He reports persistent cough nonproductive over the last 5 or 6 days. He also says he had the same experience about 6 days ago and reports he has had sex in between these episodes where he did not developed the symptoms. He denies headache visual disturbances numbness or tingling of extremities. He denies lower extremity edema or orthopnea.  He denies abdominal pain dysuria hematuria frequency or urgency. He denies constipation diarrhea melanoma. He denies fever chills or recent sick contacts. Workup in the emergency room includes a chest x-ray with patchy right lower lobe and left midlung field infiltrates consistent with pneumonia, lab work remarkable for potassium of 3.2, calcium 8.8 serum glucose 227. Initial troponin 0.05. BNP is 158. EKG Sinus rhythm LAE, consider biatrial enlargement S1,S2,S3 pattern Abnormal R-wave progression, late transition Minimal ST depression, inferior leads Prolonged QT interval Baseline wander No significant change since last tracing 06 Nov 2014 In the emergency department he is provided with albuterol nebulizer azithromycin intravenously Rocephin intravenously Atrovent nebulizer and 40 mEq of potassium.  At the time  of my exam his blood pressures 141/90 heart rate 79 breast 16 oxygen saturation level 93% on room air.   Review of Systems:  10 point review of systems complete and all systems are negative except as indicated in the history of present illness  Past Medical History  Diagnosis Date  . GERD (gastroesophageal reflux disease)   . Hyperlipidemia   . Hypertension   . Low back pain   . Benign prostatic hypertrophy   . Diabetes mellitus without complication   . GERD (gastroesophageal reflux disease)    Past Surgical History  Procedure Laterality Date  . A spinal surgery      Dr Danielle Dess   . Shoulder arthroscopy    . Shoulder arthroscopy  05/21/2012    Procedure: ARTHROSCOPY SHOULDER;  Surgeon: [name], MD;  Location: [facility];  Service: Orthopedics;  Laterality: Right;  . Subacromial decompression  05/21/2012    Procedure: SUBACROMIAL DECOMPRESSION;  Surgeon: [name], MD;  Location: [facility];  Service: Orthopedics;  Laterality: Right;  . Bicept tenodesis Right 11/10/2014    Procedure: BICEPS TENODESIS;  Surgeon: [name], MD;  Location: [facility];  Service: Orthopedics;  Laterality: Right;  biceps tendon   Social History:  reports that he has never smoked. He does not have any smokeless tobacco history on file. He reports that he drinks alcohol. He reports that he does not use illicit drugs. 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 Demerol [Meperidine] Itching  . Lansoprazole Other (See Comments)    unknown    No family history on file. others alive history diabetes father is alive he's unaware of his medical history siblings without  history of CAD heart attack stroke  Prior to Admission medications   Medication Sig Start Date End Date Taking? Authorizing Provider  aspirin EC 325 MG EC tablet Take 1 tablet (325 mg total) by mouth daily. 04/14/13  Yes Oval Linsey,  MD  BENICAR 20 MG tablet Take 1 tablet by mouth daily. 10/09/14  Yes Historical Provider, MD  BYSTOLIC 5 MG tablet Take 1 tablet by mouth daily. 10/09/14  Yes Historical Provider, MD  cloNIDine (CATAPRES) 0.2 MG tablet Take 1 tablet by mouth 2 (two) times daily. 04/30/15  Yes Historical Provider, MD  Insulin Glargine (TOUJEO SOLOSTAR [place]) Inject 40 Units into the skin at bedtime.    Yes Historical Provider, MD  metFORMIN (GLUCOPHAGE) 500 MG tablet Take 500 mg by mouth 2 (two) times daily with a meal.    Yes Historical Provider, MD  pravastatin (PRAVACHOL) 40 MG tablet Take 40 mg by mouth daily.   Yes Historical Provider, MD  glucose blood test strip 1 each by Other route as needed for other. Use as instructed    Historical Provider, MD   Physical Exam: Filed Vitals:   06/15/15 0801 06/15/15 0830 06/15/15 0900 06/15/15 0930  BP:  106/87 147/96 141/90  Pulse:   75 79  Temp:      TempSrc:      Resp:   18 16  Height:      Weight:      SpO2: 96%  90% 93%    Wt Readings from Last 3 Encounters:  06/15/15 117.935 kg (260 lb)  03/01/15 119.75 kg (264 lb)  12/21/14 119.75 kg (264 lb)    General:  Appears calm and comfortable Eyes: PERRL, normal lids, irises & conjunctiva ENT: grossly normal hearing, lips & tongue Neck: no LAD, masses or thyromegaly Cardiovascular: RRR, no m/r/g. No LE edema. Respiratory: CTA bilaterally somewhat diminished on the right I hear no crackles or wheezes. Normal respiratory effort. Abdomen: soft, ntnd positive bowel sounds nontender to palpation Skin: no rash or induration seen on limited exam Musculoskeletal: grossly normal tone BUE/BLE Psychiatric: grossly normal mood and affect, speech fluent and appropriate Neurologic: grossly non-focal. Speech clear facial symmetry           Labs on Admission:  Basic Metabolic Panel:  Recent Labs Lab 06/15/15 0710  NA 141  K 3.2*  CL 107  CO2 24  GLUCOSE 227*  BUN 15  CREATININE 1.24  CALCIUM 8.8*   Liver  Function Tests:  Recent Labs Lab 06/15/15 0710  AST 26  ALT 39  ALKPHOS 83  BILITOT 0.6  PROT 7.5  ALBUMIN 4.2   No results for input(s): LIPASE, AMYLASE in the last 168 hours. No results for input(s): AMMONIA in the last 168 hours. CBC:  Recent Labs Lab 06/15/15 0710  WBC 7.3  NEUTROABS 3.0  HGB 15.4  HCT 46.8  MCV 93.0  PLT 205   Cardiac Enzymes:  Recent Labs Lab 06/15/15 0710  TROPONINI 0.05*    BNP (last 3 results)  Recent Labs  06/15/15 0710  BNP 158.0*    ProBNP (last 3 results) No results for input(s): PROBNP in the last 8760 hours.  CBG: No results for input(s): GLUCAP in the last 168 hours.  Radiological Exams on Admission: Dg Chest 2 View  06/15/2015   CLINICAL DATA:  Shortness of breath.  Chest pain.  EXAM: CHEST  2 VIEW  COMPARISON:  06/15/2015.  FINDINGS: Mediastinum hilar structures normal . Heart size normal. Pulmonary vascularity normal. Patchy  right lower lobe and left mid lung field infiltrates again noted as previously described. No pleural effusion or pneumothorax. No acute bony abnormality. Degenerative changes thoracic spine  IMPRESSION: Patchy right lower lobe and left mid lung field infiltrates noted most consistent with pneumonia again noted. Followup PA and lateral chest X-ray is recommended in 3-4 weeks following trial of antibiotic therapy to ensure resolution and exclude underlying malignancy.   Electronically Signed   By: Maisie Fus  Register   On: 06/15/2015 08:44   Dg Chest Port 1 View  06/15/2015   CLINICAL DATA:  Shortness of breath, cough, and left chest pain.  EXAM: PORTABLE CHEST - 1 VIEW  COMPARISON:  03/02/2014  FINDINGS: Cardiac silhouette is upper limits of normal in size. There are new patchy opacities in the left midlung and right lung base. No pleural effusion or pneumothorax is identified. No acute osseous abnormality is seen.  IMPRESSION: Patchy left midlung and right basilar opacity suspicious for pneumonia. Followup PA  and lateral chest X-ray is recommended in 3-4 weeks following trial of antibiotic therapy to ensure resolution and exclude underlying malignancy.   Electronically Signed   By: Sebastian Ache   On: 06/15/2015 07:09    EKG: Independently reviewed see above  Assessment/Plan Principal Problem:   Chest pain: : Typical and Atypical features in a patient with hypertension, diabetes, high cholesterol, obesity. Heart score 45. Will admit for telemetry to rule out. Initial troponin mildly elevated will cycle troponin, will get serial EKG, we'll also obtain an echocardiogram for completeness. Will add a lipid panel to lab work. Provide nitroglycerin and morphine as needed for pain. Continue oxygen supplementation and wean as able. Continue aspirin and statin. Suspect this has more to do with his pneumonia and physical exertion. He rules out it may benefit him to have outpatient stress test. Active Problems:   CAP (community acquired pneumonia): Patient reports no fevers mild coughing over the last week. Will continue Rocephin and azithromycin that was initiated in the emergency department. Will obtain a strep pneumo urine antigen as well as Legionella urine antigen. Sputum culture as able. The cultures if he spikes a temperature.   Essential hypertension: Her pressure slightly elevated in the emergency department the patient had not taken his home medications as of yet. Will continue his Benicar, Bystolic, clonidine. Will monitor closely for optimal control    Diabetes: Patient reports starting insulin about 2 months ago. Reports compliance but he does not check his blood sugar regularly. Will obtain a hemoglobin A1c. We'll continue his long-acting insulin at a slightly lower dose and use sliding scale for optimal control. Will provide a car modified heart healthy diet.    Hyperlipidemia: Continue his statin and obtain a lipid panel   Obesity: BMI 34.5. Nutritional consult    GERD: We'll provide  PPI.    Code Status: full DVT Prophylaxis: Family Communication: wife at bedside Disposition Plan: home hopefully tomorrow  Time spent: 60 minutes  [facility] Pager [phone]

## 2015-06-15 NOTE — ED Notes (Signed)
Pt pulse ox 88% on RA, pt placed on oxygen at 2lpm via [place] with improvement in oxygen sat to 95%

## 2015-06-15 NOTE — ED Provider Notes (Signed)
CSN: [id number]     Arrival date & time 06/15/15  0615 History   First MD Initiated Contact with Patient 06/15/15 0630     Chief Complaint  Patient presents with  . Shortness of Breath  . Chest Pain     (Consider location/radiation/quality/duration/timing/severity/associated sxs/prior Treatment) HPI  Patient reports about 30 minutes prior to arrival while having sex with his wife he started having raspy breathing and difficulty breathing. He states he felt like he has a knot in his left  rib cage that goes up into his throat and it makes him cough. He states sometimes when he coughs up sputum it is is red tinged. He does however denies a lot of coughing. He reported that happen once before while they were having sex and it lasted about 30-45 minutes. That was about a week ago. He however has had sex since then without having shortness of breath. He denies any swelling of his extremities or in his scrotum. He denies any fever. He denies using any products such as Viagra. Patient has a history of diabetes and was started on insulin 2 months ago in addition to his oral agents. He also has a history of hypertension and high cholesterol.   Family history mother had reactive airway disease, there was no family history according artery disease.  PCP Dr [name]  Past Medical History  Diagnosis Date  . GERD (gastroesophageal reflux disease)   . Hyperlipidemia   . Hypertension   . Low back pain   . Benign prostatic hypertrophy   . Diabetes mellitus without complication    Past Surgical History  Procedure Laterality Date  . A spinal surgery      Dr Danielle Dess   . Shoulder arthroscopy    . Shoulder arthroscopy  05/21/2012    Procedure: ARTHROSCOPY SHOULDER;  Surgeon: [name], MD;  Location: [facility];  Service: Orthopedics;  Laterality: Right;  . Subacromial decompression  05/21/2012    Procedure: SUBACROMIAL DECOMPRESSION;  Surgeon: [name], MD;  Location: [facility];  Service:  Orthopedics;  Laterality: Right;  . Bicept tenodesis Right 11/10/2014    Procedure: BICEPS TENODESIS;  Surgeon: [name], MD;  Location: [facility];  Service: Orthopedics;  Laterality: Right;  biceps tendon   No family history on file. History  Substance Use Topics  . Smoking status: Never Smoker   . Smokeless tobacco: Not on file  . Alcohol Use: Yes     Comment: occasionally  pt is a Runner, broadcasting/film/video  Review of Systems  All other systems reviewed and are negative.     Allergies  Demerol and Lansoprazole  Home Medications   Prior to Admission medications   Medication Sig Start Date End Date Taking? Authorizing Provider  aspirin EC 325 MG EC tablet Take 1 tablet (325 mg total) by mouth daily. 04/14/13  Yes Oval Linsey, MD  BENICAR 20 MG tablet Take 1 tablet by mouth daily. 10/09/14  Yes Historical Provider, MD  BYSTOLIC 5 MG tablet Take 1 tablet by mouth daily. 10/09/14  Yes Historical Provider, MD  glucose blood test strip 1 each by Other route as needed for other. Use as instructed   Yes Historical Provider, MD  Insulin Glargine (TOUJEO SOLOSTAR [place]) Inject into the skin.   Yes Historical Provider, MD  metFORMIN (GLUCOPHAGE) 500 MG tablet Take 500 mg by mouth 2 (two) times daily with a meal.    Yes Historical Provider, MD  amLODipine (NORVASC) 5 MG tablet Take 1 tablet (  5 mg total) by mouth daily. 04/14/13   Oval Linsey, MD  atorvastatin (LIPITOR) 40 MG tablet Take 1 tablet (40 mg total) by mouth daily at 6 PM. 04/14/13   Oval Linsey, MD  cyclobenzaprine (FLEXERIL) 10 MG tablet Take 1 tablet (10 mg total) by mouth 3 (three) times daily as needed. For muscle spasms Patient taking differently: Take 10 mg by mouth 3 (three) times daily as needed for muscle spasms. For muscle spasms 05/09/14   [name], MD  ibuprofen (ADVIL,MOTRIN) 800 MG tablet Take 1 tablet (800 mg total) by mouth every 8 (eight) hours as needed for moderate pain. 06/19/14   [name], MD   lisinopril (PRINIVIL,ZESTRIL) 20 MG tablet Take 20 mg by mouth daily.    Historical Provider, MD  metoprolol (TOPROL XL) 50 MG 24 hr tablet Take 50 mg by mouth daily.     Historical Provider, MD  nitroGLYCERIN (NITROSTAT) 0.4 MG SL tablet Place 1 tablet (0.4 mg total) under the tongue every 5 (five) minutes as needed for chest pain. 04/14/13   Oval Linsey, MD  ondansetron (ZOFRAN) 4 MG tablet Take 1 tablet (4 mg total) by mouth every 6 (six) hours. 10/05/13   [name], MD  oxyCODONE-acetaminophen (ROXICET) 5-325 MG per tablet Take 1 tablet by mouth every 8 (eight) hours as needed for severe pain. 05/29/15   [name], MD  pravastatin (PRAVACHOL) 40 MG tablet Take 40 mg by mouth at bedtime. 07/29/13   Historical Provider, MD  promethazine (PHENERGAN) 12.5 MG tablet Take 1 tablet (12.5 mg total) by mouth every 6 (six) hours as needed for nausea or vomiting. 11/10/14   [name], MD   BP 175/103 mmHg  Pulse 80  Temp(Src) 98.5 F (36.9 C) (Oral)  Resp 28  Ht  (1.854 m)  Wt 260 lb (117.935 kg)  BMI 34.31 kg/m2  SpO2 94%  Vital signs normal  Except for hypertension and tachypnea  Physical Exam  Constitutional: He is oriented to person, place, and time. He appears well-developed and well-nourished.  Non-toxic appearance. He does not appear ill. He appears distressed.  HENT:  Head: Normocephalic and atraumatic.  Right Ear: External ear normal.  Left Ear: External ear normal.  Nose: Nose normal. No mucosal edema or rhinorrhea.  Mouth/Throat: Oropharynx is clear and moist and mucous membranes are normal. No dental abscesses or uvula swelling.  Eyes: Conjunctivae and EOM are normal. Pupils are equal, round, and reactive to light.  Neck: Normal range of motion and full passive range of motion without pain. Neck supple.  Cardiovascular: Normal rate, regular rhythm and normal heart sounds.  Exam reveals no gallop and no friction rub.   No murmur  heard. Pulmonary/Chest: He is in respiratory distress. He has wheezes. He has no rhonchi. He has rales. He exhibits no tenderness and no crepitus.   Patient is noted have rales especially in the left base , he is having retractions and appears short of breath. He has some scattered wheezing.  Abdominal: Soft. Normal appearance and bowel sounds are normal. He exhibits no distension. There is no tenderness. There is no rebound and no guarding.  Musculoskeletal: Normal range of motion. He exhibits no edema or tenderness.  Moves all extremities well.   Neurological: He is alert and oriented to person, place, and time. He has normal strength. No cranial nerve deficit.  Skin: Skin is warm and intact. No rash noted. No erythema. No pallor.   Patient is  diaphoretic on his face  Psychiatric: He has a normal mood and affect. His speech is normal and behavior is normal. His mood appears not anxious.  Nursing note and vitals reviewed.   ED Course  Procedures (including critical care time)  Medications  albuterol (PROVENTIL,VENTOLIN) solution continuous neb (not administered)  ipratropium (ATROVENT) nebulizer solution 0.5 mg (not administered)  cefTRIAXone (ROCEPHIN) 1 g in dextrose 5 % 50 mL IVPB (1 g Intravenous New Bag/Given 06/15/15 0734)  azithromycin (ZITHROMAX) 500 mg in dextrose 5 % 250 mL IVPB (not administered)  ipratropium-albuterol (DUONEB) 0.5-2.5 (3) MG/3ML nebulizer solution 3 mL (3 mLs Nebulization Given 06/15/15 1610)  albuterol (PROVENTIL) (2.5 MG/3ML) 0.083% nebulizer solution 2.5 mg (2.5 mg Nebulization Given 06/15/15 0631)   Patient started on nebulizer treatment.   Recheck at 7:20 AM after his nebulizer treatment. Patient seems a little less risk distress. He still has some scattered wheezing now but he does have some mild improvement of air movement. He states he has been sweating easier than usual last week or so. He has had some cough and no documented fever. A continuous nebulizer  was ordered with 15 mg of albuterol. We discussed his chest x-ray. And he was started on IV antibiotics for possible community acquired pneumonia.   07:37 AM pt turned over to Dr Blinda Leatherwood at change of shift.   Labs Review  pending    Imaging Review Dg Chest Port 1 View  06/15/2015   CLINICAL DATA:  Shortness of breath, cough, and left chest pain.  EXAM: PORTABLE CHEST - 1 VIEW  COMPARISON:  03/02/2014  FINDINGS: Cardiac silhouette is upper limits of normal in size. There are new patchy opacities in the left midlung and right lung base. No pleural effusion or pneumothorax is identified. No acute osseous abnormality is seen.  IMPRESSION: Patchy left midlung and right basilar opacity suspicious for pneumonia. Followup PA and lateral chest X-ray is recommended in 3-4 weeks following trial of antibiotic therapy to ensure resolution and exclude underlying malignancy.   Electronically Signed   By: Sebastian Ache   On: 06/15/2015 07:09     EKG Interpretation   Date/Time:  Friday June 15 2015 06:25:40 EDT Ventricular Rate:  81 PR Interval:  151 QRS Duration: 93 QT Interval:  436 QTC Calculation: 506 R Axis:   7 Text Interpretation:  Sinus rhythm LAE, consider biatrial enlargement  S1,S2,S3 pattern Abnormal R-wave progression, late transition Minimal ST  depression, inferior leads Prolonged QT interval Baseline wander No  significant change since last tracing 06 Nov 2014 Confirmed by Ailanie Ruttan   MD-I, Elnita Surprenant ([id number]) on 06/15/2015 6:45:27 AM      MDM   Final diagnoses:  SOB (shortness of breath)  Bronchospasm  CAP (community acquired pneumonia)  Hypoxia    Disposition pending   Devoria Albe, MD, Concha Pyo, MD 06/15/15 [phone]

## 2015-06-16 DIAGNOSIS — J181 Lobar pneumonia, unspecified organism: Secondary | ICD-10-CM | POA: Diagnosis present

## 2015-06-16 DIAGNOSIS — Z794 Long term (current) use of insulin: Secondary | ICD-10-CM | POA: Diagnosis not present

## 2015-06-16 DIAGNOSIS — R079 Chest pain, unspecified: Secondary | ICD-10-CM | POA: Diagnosis present

## 2015-06-16 DIAGNOSIS — E669 Obesity, unspecified: Secondary | ICD-10-CM | POA: Diagnosis present

## 2015-06-16 DIAGNOSIS — I10 Essential (primary) hypertension: Secondary | ICD-10-CM | POA: Diagnosis present

## 2015-06-16 DIAGNOSIS — Z6834 Body mass index (BMI) 34.0-34.9, adult: Secondary | ICD-10-CM | POA: Diagnosis not present

## 2015-06-16 DIAGNOSIS — K219 Gastro-esophageal reflux disease without esophagitis: Secondary | ICD-10-CM | POA: Diagnosis present

## 2015-06-16 DIAGNOSIS — I2510 Atherosclerotic heart disease of native coronary artery without angina pectoris: Secondary | ICD-10-CM | POA: Diagnosis present

## 2015-06-16 DIAGNOSIS — R0902 Hypoxemia: Secondary | ICD-10-CM | POA: Diagnosis present

## 2015-06-16 DIAGNOSIS — E780 Pure hypercholesterolemia: Secondary | ICD-10-CM | POA: Diagnosis present

## 2015-06-16 DIAGNOSIS — E785 Hyperlipidemia, unspecified: Secondary | ICD-10-CM | POA: Diagnosis present

## 2015-06-16 DIAGNOSIS — E119 Type 2 diabetes mellitus without complications: Secondary | ICD-10-CM | POA: Diagnosis present

## 2015-06-16 LAB — GLUCOSE, CAPILLARY
GLUCOSE-CAPILLARY: 179 mg/dL — AB (ref 65–99)
GLUCOSE-CAPILLARY: 236 mg/dL — AB (ref 65–99)
GLUCOSE-CAPILLARY: 197 mg/dL — AB (ref 65–99)
GLUCOSE-CAPILLARY: 186 mg/dL — AB (ref 65–99)

## 2015-06-16 LAB — TSH: TSH: 0.858 u[IU]/mL (ref 0.350–4.500)

## 2015-06-16 LAB — HEMOGLOBIN A1C
Hgb A1c MFr Bld: 9.1 % — ABNORMAL HIGH (ref 4.8–5.6)
Mean Plasma Glucose: 214 mg/dL

## 2015-06-16 LAB — BASIC METABOLIC PANEL
Anion gap: 7 (ref 5–15)
BUN: 15 mg/dL (ref 6–20)
CALCIUM: 8.6 mg/dL — AB (ref 8.9–10.3)
CO2: 25 mmol/L (ref 22–32)
Chloride: 110 mmol/L (ref 101–111)
Creatinine, Ser: 0.95 mg/dL (ref 0.61–1.24)
GFR calc Af Amer: >60 mL/min (ref >60)
Glucose, Bld: 198 mg/dL — ABNORMAL HIGH (ref 65–99)
Potassium: 3.6 mmol/L (ref 3.5–5.1)
SODIUM: 142 mmol/L (ref 135–145)

## 2015-06-16 LAB — TROPONIN I: TROPONIN I: 0.05 ng/mL — AB (ref <0.031)

## 2015-06-16 MED ORDER — METHOCARBAMOL 500 MG PO TABS
750.0000 mg | ORAL_TABLET | Freq: Four times a day (QID) | ORAL | Status: DC | PRN
  Administered 2015-06-16 – 2015-06-19 (×7): 750 mg via ORAL
  Filled 2015-06-16 (×7): qty 2

## 2015-06-16 NOTE — Progress Notes (Addendum)
Pt BP 158/106. Administered scheduled clonidine. Will continue to monitor.

## 2015-06-16 NOTE — Progress Notes (Signed)
Patient has CAP with right lower lobe and left middle lobe infiltrate has multiple risk factors for CAD with concomitant atypical chest pain which now is nonexertional he likewise has hyperlipidemia hypertension and insulin dependent diabetes with questionable compliance Geoffrey Jackson ZOX:[id number] DOB: 11-20-1966 DOA: 06/15/2015 PCP: Geoffrey Stalling, MD             Physical Exam: Blood pressure 150/96, pulse 72, temperature 98.2 F (36.8 C), temperature source Oral, resp. rate 18, height 6\' 1"  (1.854 Jackson), weight 264 lb 11.2 oz (120.067 kg), SpO2 96 %. lungs scattered rhonchi prolonged inspiratory  Phase no rales no wheezes appreciable heart regular rhythm no S3-S4 no heaves thrills rubs abdomen soft nontender bowel sounds normoactive no guarding or rebound   Investigations:  No results found for this or any previous visit (from the past 240 hour(s)).   Basic Metabolic Panel:  Recent Labs  81/19/14 0710 06/15/15 0711 06/16/15 0616  NA 141  --  142  K 3.2*  --  3.6  CL 107  --  110  CO2 24  --  25  GLUCOSE 227*  --  198*  BUN 15  --  15  CREATININE 1.24  --  0.95  CALCIUM 8.8*  --  8.6*  MG  --  1.7  --    Liver Function Tests:  Recent Labs  06/15/15 0710  AST 26  ALT 39  ALKPHOS 83  BILITOT 0.6  PROT 7.5  ALBUMIN 4.2     CBC:  Recent Labs  06/15/15 0710  WBC 7.3  NEUTROABS 3.0  HGB 15.4  HCT 46.8  MCV 93.0  PLT 205    Dg Chest 2 View  06/15/2015   CLINICAL DATA:  Shortness of breath.  Chest pain.  EXAM: CHEST  2 VIEW  COMPARISON:  06/15/2015.  FINDINGS: Mediastinum hilar structures normal . Heart size normal. Pulmonary vascularity normal. Patchy right lower lobe and left mid lung field infiltrates again noted as previously described. No pleural effusion or pneumothorax. No acute bony abnormality. Degenerative changes thoracic spine  IMPRESSION: Patchy right lower lobe and left mid lung field infiltrates noted most consistent with pneumonia  again noted. Followup PA and lateral chest X-ray is recommended in 3-4 weeks following trial of antibiotic therapy to ensure resolution and exclude underlying malignancy.   Electronically Signed   By: Geoffrey Jackson  Register   On: 06/15/2015 08:44   Dg Chest Port 1 View  06/15/2015   CLINICAL DATA:  Shortness of breath, cough, and left chest pain.  EXAM: PORTABLE CHEST - 1 VIEW  COMPARISON:  03/02/2014  FINDINGS: Cardiac silhouette is upper limits of normal in size. There are new patchy opacities in the left midlung and right lung base. No pleural effusion or pneumothorax is identified. No acute osseous abnormality is seen.  IMPRESSION: Patchy left midlung and right basilar opacity suspicious for pneumonia. Followup PA and lateral chest X-ray is recommended in 3-4 weeks following trial of antibiotic therapy to ensure resolution and exclude underlying malignancy.   Electronically Signed   By: Geoffrey Jackson   On: 06/15/2015 07:09      Medications:   Impression:  Principal Problem:   Chest pain Active Problems:   Diabetes mellitus type 2 in obese   Hyperlipidemia   Obesity   Essential hypertension   GERD   CAP (community acquired pneumonia)   Elevated troponin I level     Plan: Continue anti-biotics monitor clinical progress and bili patient in hallways to see  if any exertional chest tightness and sutures unit hypertensive antilipid Geoffrey Jackson and insulin coverage  Consultants:    Procedures   Antibiotics:                   Code Status: Full   Family Communication:  Geoffrey Jackson with patient at length  Disposition Plan see plan above  Time spent: 30 minutes   LOS: 1 day   Geoffrey Jackson   06/16/2015, 11:14 AM

## 2015-06-17 LAB — GLUCOSE, CAPILLARY
GLUCOSE-CAPILLARY: 177 mg/dL — AB (ref 65–99)
GLUCOSE-CAPILLARY: 208 mg/dL — AB (ref 65–99)
Glucose-Capillary: 204 mg/dL — ABNORMAL HIGH (ref 65–99)
Glucose-Capillary: 165 mg/dL — ABNORMAL HIGH (ref 65–99)

## 2015-06-17 MED ORDER — IRBESARTAN 300 MG PO TABS
300.0000 mg | ORAL_TABLET | Freq: Every day | ORAL | Status: DC
  Administered 2015-06-17 – 2015-06-19 (×3): 300 mg via ORAL
  Filled 2015-06-17 (×3): qty 1

## 2015-06-17 NOTE — Progress Notes (Signed)
Decent glycemic control blood pressure elevated will increase Avapro from 150-300 continue dual IV antiemetics for bilateral infiltrates no further ischemic type pain Geoffrey Jackson ZOX:[id number] DOB: 1966-05-08 DOA: 06/15/2015 PCP: Geoffrey Stalling, MD             Physical Exam: Blood pressure 178/93, pulse 59, temperature 97.8 F (36.6 C), temperature source Oral, resp. rate 19, height  (1.854 Jackson), weight 268 lb 6.4 oz (121.745 kg), SpO2 97 %. lungs coarse rhonchi bilaterally and prolonged his return x-ray phase no rales no wheezes appreciable heart regular rhythm no S3-S4 no heaves thrills rubs abdomen soft nontender bowel sounds normoactive   Investigations:  No results found for this or any previous visit (from the past 240 hour(s)).   Basic Metabolic Panel:  Recent Labs  81/19/14 0710 06/15/15 0711 06/16/15 0616  NA 141  --  142  K 3.2*  --  3.6  CL 107  --  110  CO2 24  --  25  GLUCOSE 227*  --  198*  BUN 15  --  15  CREATININE 1.24  --  0.95  CALCIUM 8.8*  --  8.6*  MG  --  1.7  --    Liver Function Tests:  Recent Labs  06/15/15 0710  AST 26  ALT 39  ALKPHOS 83  BILITOT 0.6  PROT 7.5  ALBUMIN 4.2     CBC:  Recent Labs  06/15/15 0710  WBC 7.3  NEUTROABS 3.0  HGB 15.4  HCT 46.8  MCV 93.0  PLT 205    Dg Chest 2 View  06/15/2015   CLINICAL DATA:  Shortness of breath.  Chest pain.  EXAM: CHEST  2 VIEW  COMPARISON:  06/15/2015.  FINDINGS: Mediastinum hilar structures normal . Heart size normal. Pulmonary vascularity normal. Patchy right lower lobe and left mid lung field infiltrates again noted as previously described. No pleural effusion or pneumothorax. No acute bony abnormality. Degenerative changes thoracic spine  IMPRESSION: Patchy right lower lobe and left mid lung field infiltrates noted most consistent with pneumonia again noted. Followup PA and lateral chest X-ray is recommended in 3-4 weeks following trial of antibiotic therapy  to ensure resolution and exclude underlying malignancy.   Electronically Signed   By: Geoffrey Fus  Jackson   On: 06/15/2015 08:44      Medications:   Impression:  Principal Problem:   Chest pain Active Problems:   Diabetes mellitus type 2 in obese   Hyperlipidemia   Obesity   Essential hypertension   GERD   CAP (community acquired pneumonia)   Elevated troponin I level     Plan: Continue Rocephin and Zithromax IV increase Avapro 150-300 by mouth daily continue monitoring glycemic control  Consultants:    Procedures   Antibiotics: IV Rocephin IV Zithromax                  Code Status: Full  Family Communication:    Disposition Plan see plan above  Time spent: 30 minutes   LOS: 2 days   Geoffrey Jackson   06/17/2015, 7:12 AM

## 2015-06-17 NOTE — Progress Notes (Signed)
Pt blood pressure 178/93. Contacted Dr.Dondiego who ordered for the Avapro to be increased from 150 mg to 300 mg. Will continue to monitor

## 2015-06-18 LAB — LEGIONELLA ANTIGEN, URINE

## 2015-06-18 LAB — GLUCOSE, CAPILLARY
GLUCOSE-CAPILLARY: 249 mg/dL — AB (ref 65–99)
Glucose-Capillary: 145 mg/dL — ABNORMAL HIGH (ref 65–99)
Glucose-Capillary: 153 mg/dL — ABNORMAL HIGH (ref 65–99)
Glucose-Capillary: 161 mg/dL — ABNORMAL HIGH (ref 65–99)

## 2015-06-18 MED ORDER — DIPHENHYDRAMINE HCL 25 MG PO CAPS
50.0000 mg | ORAL_CAPSULE | Freq: Once | ORAL | Status: AC
  Administered 2015-06-18: 50 mg via ORAL
  Filled 2015-06-18: qty 2

## 2015-06-18 MED ORDER — DIPHENHYDRAMINE HCL 50 MG/ML IJ SOLN
25.0000 mg | Freq: Four times a day (QID) | INTRAMUSCULAR | Status: DC | PRN
  Administered 2015-06-18 – 2015-06-19 (×2): 25 mg via INTRAVENOUS
  Filled 2015-06-18 (×2): qty 1

## 2015-06-18 NOTE — Care Management Note (Signed)
Case Management Note  Patient Details  Name: Geoffrey Jackson MRN: [id number] Date of Birth: March 04, 1966  Subjective/Objective:                  Pt admitted from home with bilateral pneumonia. Pt lives with significant other and will return home at discharge. Pt is independent with ADl's.  Action/Plan: No CM needs noted. Anticipate discharge within 24 hours.  Expected Discharge Date:  06/18/15               Expected Discharge Plan:  Home/Self Care  In-House Referral:  NA  Discharge planning Services  CM Consult  Post Acute Care Choice:  NA Choice offered to:  NA  DME Arranged:    DME Agency:     HH Arranged:    HH Agency:     Status of Service:  Completed, signed off  Medicare Important Message Given:    Date Medicare IM Given:    Medicare IM give by:    Date Additional Medicare IM Given:    Additional Medicare Important Message give by:     If discussed at Long Length of Stay Meetings, dates discussed:    Additional Comments:  Cheryl Flash, RN 06/18/2015, 11:06 AM

## 2015-06-18 NOTE — Progress Notes (Signed)
Inpatient Diabetes Program Recommendations  AACE/ADA: New Consensus Statement on Inpatient Glycemic Control (2013)  Target Ranges:  Prepandial:   less than 140 mg/dL      Peak postprandial:   less than 180 mg/dL (1-2 hours)      Critically ill patients:  140 - 180 mg/dL   Results for Geoffrey Jackson, Geoffrey Jackson (MRN [id number]) as of 06/18/2015 11:20  Ref. Range 06/17/2015 07:28 06/17/2015 11:34 06/17/2015 16:16 06/17/2015 20:58 06/18/2015 07:26  Glucose-Capillary Latest Ref Range: 65-99 mg/dL 409 (H) 811 (H) 914 (H) 208 (H) 145 (H)    Diabetes history: DM2 Outpatient Diabetes medications: Toujeo 40 units QHS, Metformin 500 mg BID Current orders for Inpatient glycemic control: Lantus 30 units QHS, Novolog 0-15 units TID with meals, Novolog 0-5 units HS  Inpatient Diabetes Program Recommendations Insulin - Meal Coverage: While inpatient, please consider ordering Novolog 4 units TID with meals for meal coverage. A1C: A1C was 9.1% on 06/15/15 indicating poor glycemic control over the past 2-3 months.  Thanks, [name], RN, MSN, CCRN, CDE Diabetes Coordinator Inpatient Diabetes Program [phone] (Team Pager from 8am to 5pm) [phone] ([facility] office) [phone] [facility] office) [phone] [facility] office)

## 2015-06-18 NOTE — Plan of Care (Addendum)
Pt complaining of increased pain on bilat flanks, especially on LF side.  Indicating that he's having difficulty breathing but SPO2 remains in low to mid 90's on RA.  When pt ambulates and sits on side of bed, pain/SOB is tolerable, but substantially increases when he lies down in the bed.  Pt Given pain meds and Dr. Truitt Merle - No further orders at this time.

## 2015-06-18 NOTE — Progress Notes (Signed)
Patient with multi lobar infiltrates currently on Rocephin and Zithromax Geoffrey BONSELL ZOX:[id number] DOB: 1966/06/16 DOA: 06/15/2015 PCP: Isabella Stalling, MD             Physical Exam: Blood pressure 167/96, pulse 74, temperature 98.6 F (37 C), temperature source Oral, resp. rate 18, height  (1.854 Jackson), weight 268 lb 6.4 oz (121.745 kg), SpO2 99 %. lungs show prolonged inspiratory phase. Phase no rales wheeze rhonchi appreciable heart regular rhythm no murmurs goes heaves thrills rubs abdomen soft nontender bowel sounds normoactive   Investigations:  No results found for this or any previous visit (from the past 240 hour(s)).   Basic Metabolic Panel:  Recent Labs  81/19/14 0710 06/15/15 0711 06/16/15 0616  NA 141  --  142  K 3.2*  --  3.6  CL 107  --  110  CO2 24  --  25  GLUCOSE 227*  --  198*  BUN 15  --  15  CREATININE 1.24  --  0.95  CALCIUM 8.8*  --  8.6*  MG  --  1.7  --    Liver Function Tests:  Recent Labs  06/15/15 0710  AST 26  ALT 39  ALKPHOS 83  BILITOT 0.6  PROT 7.5  ALBUMIN 4.2     CBC:  Recent Labs  06/15/15 0710  WBC 7.3  NEUTROABS 3.0  HGB 15.4  HCT 46.8  MCV 93.0  PLT 205    No results found.    Medications:   Impression:  Principal Problem:   Chest pain Active Problems:   Diabetes mellitus type 2 in obese   Hyperlipidemia   Obesity   Essential hypertension   GERD   CAP (community acquired pneumonia)   Elevated troponin I level     Plan: Continue Rocephin and Zithromax ambulate patient  Consultants:    Procedures   Antibiotics: Rocephin and Zithromax IV                  Code Status:   Family Communication:    Disposition Plan see plan above  Time spent: 30 minutes   LOS: 3 days   Geoffrey Jackson   06/18/2015, 6:38 AM

## 2015-06-18 NOTE — Progress Notes (Signed)
Pt c/o itching on bilateral arms and face. Paged Dr [name], who ordered 50 mg Benadryl po X1. Will administer as soon as available. Will continue to monitor. Bed remains in lowest position and call bell is within reach.  0200 Pt denies itching at this time. Will monitor.

## 2015-06-19 LAB — GLUCOSE, CAPILLARY
GLUCOSE-CAPILLARY: 203 mg/dL — AB (ref 65–99)
Glucose-Capillary: 280 mg/dL — ABNORMAL HIGH (ref 65–99)

## 2015-06-19 MED ORDER — OLMESARTAN MEDOXOMIL 20 MG PO TABS: 20.0000 mg | ORAL_TABLET | Freq: Every day | ORAL | Status: DC

## 2015-06-19 MED ORDER — LEVOFLOXACIN 750 MG PO TABS: 750.0000 mg | ORAL_TABLET | Freq: Every day | ORAL | Status: DC

## 2015-06-19 MED ORDER — NITROGLYCERIN 0.4 MG SL SUBL: 0.4000 mg | SUBLINGUAL_TABLET | SUBLINGUAL | Status: AC | PRN

## 2015-06-19 NOTE — Care Management Note (Signed)
Case Management Note  Patient Details  Name: Geoffrey Jackson MRN: [id number] Date of Birth: 06-14-1966  Subjective/Objective:                    Action/Plan:   Expected Discharge Date:  06/18/15               Expected Discharge Plan:  Home/Self Care  In-House Referral:  NA  Discharge planning Services  CM Consult  Post Acute Care Choice:  NA Choice offered to:  NA  DME Arranged:    DME Agency:     HH Arranged:    HH Agency:     Status of Service:  Completed, signed off  Medicare Important Message Given:    Date Medicare IM Given:    Medicare IM give by:    Date Additional Medicare IM Given:    Additional Medicare Important Message give by:     If discussed at Long Length of Stay Meetings, dates discussed:    Additional Comments: Pt discharged home today. No CM needs noted. Arlyss Queen [place], RN 06/19/2015, 12:39 PM

## 2015-06-19 NOTE — Discharge Summary (Signed)
Physician Discharge Summary  Geoffrey Jackson UJW:[id number] DOB: September 04, 1966 DOA: 06/15/2015  PCP: Isabella Stalling, MD  Admit date: 06/15/2015 Discharge date: 06/19/2015   Recommendations for Outpatient Follow-up:  Patient is to take Levaquin 750 by mouth daily for 5 additional days and follow-up my office in 4-5 days time to assess lung infiltrates and clinical response patient is cautioned to ambulate Discharge Diagnoses:  Principal Problem:   Chest pain Active Problems:   Diabetes mellitus type 2 in obese   Hyperlipidemia   Obesity   Essential hypertension   GERD   CAP (community acquired pneumonia)   Elevated troponin I level   Discharge Condition: Good  Filed Weights   06/17/15 0500 06/18/15 0645 06/19/15 0509  Weight: 268 lb 6.4 oz (121.745 kg) 267 lb 11.2 oz (121.428 kg) 266 lb 8 oz (120.884 kg)    History of present illness:  Patient admitted with some atypical chest pain and has multiple refractors for CAD including diabetes hypertension and hyperlipidemia all under decent control patient noted to have bilateral lobar pneumonia right lower lobe and left middle lobe placed on Rocephin and Zithromax  CAP he had a 5-6 day course of therapy and was subtotally improved clinically he had some left sided pleuritic chest pain which diminished her result hospital stay he had no exertional angina anginal equivalents despite ambulation in hospital I had good glycemic control and good hemodynamic control of his blood pressure  Hospital Course:  See history of present illness  Procedures:    Consultations:    Discharge Instructions     Medication List    ASK your doctor about these medications        aspirin 325 MG EC tablet  Take 1 tablet (325 mg total) by mouth daily.     BENICAR 20 MG tablet  Generic drug:  olmesartan  Take 1 tablet by mouth daily.     BYSTOLIC 5 MG tablet  Generic drug:  nebivolol  Take 1 tablet by mouth daily.     cloNIDine 0.2  MG tablet  Commonly known as:  CATAPRES  Take 1 tablet by mouth 2 (two) times daily.     GLUCOPHAGE 500 MG tablet  Generic drug:  metFORMIN  Take 500 mg by mouth 2 (two) times daily with a meal.     glucose blood test strip  1 each by Other route as needed for other. Use as instructed     pravastatin 40 MG tablet  Commonly known as:  PRAVACHOL  Take 40 mg by mouth daily.     TOUJEO SOLOSTAR [place]  Inject 40 Units into the skin at bedtime.       Allergies  Allergen Reactions  . Demerol [Meperidine] Itching  . Lansoprazole Other (See Comments)    unknown      The results of significant diagnostics from this hospitalization (including imaging, microbiology, ancillary and laboratory) are listed below for reference.    Significant Diagnostic Studies: Dg Chest 2 View  06/15/2015   CLINICAL DATA:  Shortness of breath.  Chest pain.  EXAM: CHEST  2 VIEW  COMPARISON:  06/15/2015.  FINDINGS: Mediastinum hilar structures normal . Heart size normal. Pulmonary vascularity normal. Patchy right lower lobe and left mid lung field infiltrates again noted as previously described. No pleural effusion or pneumothorax. No acute bony abnormality. Degenerative changes thoracic spine  IMPRESSION: Patchy right lower lobe and left mid lung field infiltrates noted most consistent with pneumonia again noted. Followup PA and lateral  chest X-ray is recommended in 3-4 weeks following trial of antibiotic therapy to ensure resolution and exclude underlying malignancy.   Electronically Signed   By: Maisie Fus  Register   On: 06/15/2015 08:44   Dg Chest Port 1 View  06/15/2015   CLINICAL DATA:  Shortness of breath, cough, and left chest pain.  EXAM: PORTABLE CHEST - 1 VIEW  COMPARISON:  03/02/2014  FINDINGS: Cardiac silhouette is upper limits of normal in size. There are new patchy opacities in the left midlung and right lung base. No pleural effusion or pneumothorax is identified. No acute osseous abnormality is seen.   IMPRESSION: Patchy left midlung and right basilar opacity suspicious for pneumonia. Followup PA and lateral chest X-ray is recommended in 3-4 weeks following trial of antibiotic therapy to ensure resolution and exclude underlying malignancy.   Electronically Signed   By: Sebastian Ache   On: 06/15/2015 07:09    Microbiology: No results found for this or any previous visit (from the past 240 hour(s)).   Labs: Basic Metabolic Panel:  Recent Labs Lab 06/15/15 0710 06/15/15 0711 06/16/15 0616  NA 141  --  142  K 3.2*  --  3.6  CL 107  --  110  CO2 24  --  25  GLUCOSE 227*  --  198*  BUN 15  --  15  CREATININE 1.24  --  0.95  CALCIUM 8.8*  --  8.6*  MG  --  1.7  --    Liver Function Tests:  Recent Labs Lab 06/15/15 0710  AST 26  ALT 39  ALKPHOS 83  BILITOT 0.6  PROT 7.5  ALBUMIN 4.2   No results for input(s): LIPASE, AMYLASE in the last 168 hours. No results for input(s): AMMONIA in the last 168 hours. CBC:  Recent Labs Lab 06/15/15 0710  WBC 7.3  NEUTROABS 3.0  HGB 15.4  HCT 46.8  MCV 93.0  PLT 205   Cardiac Enzymes:  Recent Labs Lab 06/15/15 0710 06/15/15 1337 06/16/15 0616  TROPONINI 0.05* 0.05* 0.05*   BNP: BNP (last 3 results)  Recent Labs  06/15/15 0710  BNP 158.0*    ProBNP (last 3 results) No results for input(s): PROBNP in the last 8760 hours.  CBG:  Recent Labs Lab 06/18/15 1133 06/18/15 1639 06/18/15 2214 06/19/15 0737 06/19/15 1136  GLUCAP 249* 153* 161* 203* 280*       Signed:  Vinton Layson M  [facility] Pager: [phone] 06/19/2015, 12:21 PM

## 2015-06-19 NOTE — Progress Notes (Signed)
Discharge instructions and prescriptions given, verbalized understanding, out in stable condition ambulatory with staff. 

## 2015-06-21 ENCOUNTER — Encounter (HOSPITAL_COMMUNITY): Payer: Self-pay | Admitting: Emergency Medicine

## 2015-06-21 ENCOUNTER — Emergency Department (HOSPITAL_COMMUNITY): Payer: BC Managed Care – PPO

## 2015-06-21 ENCOUNTER — Emergency Department (HOSPITAL_COMMUNITY)
Admission: EM | Admit: 2015-06-21 | Discharge: 2015-06-21 | Disposition: A | Discharge status: Home / Self Care | Payer: BC Managed Care – PPO | Attending: Emergency Medicine | Admitting: Emergency Medicine

## 2015-06-21 DIAGNOSIS — J159 Unspecified bacterial pneumonia: Secondary | ICD-10-CM | POA: Insufficient documentation

## 2015-06-21 DIAGNOSIS — E669 Obesity, unspecified: Secondary | ICD-10-CM | POA: Diagnosis not present

## 2015-06-21 DIAGNOSIS — T50905A Adverse effect of unspecified drugs, medicaments and biological substances, initial encounter: Secondary | ICD-10-CM

## 2015-06-21 DIAGNOSIS — J189 Pneumonia, unspecified organism: Secondary | ICD-10-CM

## 2015-06-21 DIAGNOSIS — Z792 Long term (current) use of antibiotics: Secondary | ICD-10-CM | POA: Insufficient documentation

## 2015-06-21 DIAGNOSIS — E785 Hyperlipidemia, unspecified: Secondary | ICD-10-CM | POA: Diagnosis not present

## 2015-06-21 DIAGNOSIS — Z87438 Personal history of other diseases of male genital organs: Secondary | ICD-10-CM | POA: Insufficient documentation

## 2015-06-21 DIAGNOSIS — E119 Type 2 diabetes mellitus without complications: Secondary | ICD-10-CM | POA: Diagnosis not present

## 2015-06-21 DIAGNOSIS — Z79899 Other long term (current) drug therapy: Secondary | ICD-10-CM | POA: Diagnosis not present

## 2015-06-21 DIAGNOSIS — R112 Nausea with vomiting, unspecified: Secondary | ICD-10-CM | POA: Diagnosis present

## 2015-06-21 DIAGNOSIS — Z7982 Long term (current) use of aspirin: Secondary | ICD-10-CM | POA: Diagnosis not present

## 2015-06-21 DIAGNOSIS — Z8719 Personal history of other diseases of the digestive system: Secondary | ICD-10-CM | POA: Diagnosis not present

## 2015-06-21 DIAGNOSIS — Z794 Long term (current) use of insulin: Secondary | ICD-10-CM | POA: Insufficient documentation

## 2015-06-21 DIAGNOSIS — F419 Anxiety disorder, unspecified: Secondary | ICD-10-CM | POA: Insufficient documentation

## 2015-06-21 DIAGNOSIS — T368X5A Adverse effect of other systemic antibiotics, initial encounter: Secondary | ICD-10-CM | POA: Insufficient documentation

## 2015-06-21 DIAGNOSIS — I10 Essential (primary) hypertension: Secondary | ICD-10-CM | POA: Insufficient documentation

## 2015-06-21 LAB — CBC WITH DIFFERENTIAL/PLATELET
Basophils Absolute: 0 10*3/uL (ref 0.0–0.1)
Basophils Relative: 1 % (ref 0–1)
EOS ABS: 0.1 10*3/uL (ref 0.0–0.7)
Eosinophils Relative: 1 % (ref 0–5)
HCT: 43.8 % (ref 39.0–52.0)
HEMOGLOBIN: 14.6 g/dL (ref 13.0–17.0)
Lymphocytes Relative: 36 % (ref 12–46)
Lymphs Abs: 2.2 10*3/uL (ref 0.7–4.0)
MCH: 30.8 pg (ref 26.0–34.0)
MCHC: 33.3 g/dL (ref 30.0–36.0)
MCV: 92.4 fL (ref 78.0–100.0)
Monocytes Absolute: 0.6 10*3/uL (ref 0.1–1.0)
Monocytes Relative: 9 % (ref 3–12)
Neutro Abs: 3.3 10*3/uL (ref 1.7–7.7)
Neutrophils Relative %: 53 % (ref 43–77)
Platelets: 184 10*3/uL (ref 150–400)
RBC: 4.74 MIL/uL (ref 4.22–5.81)
RDW: 13.4 % (ref 11.5–15.5)
WBC: 6.2 10*3/uL (ref 4.0–10.5)

## 2015-06-21 LAB — COMPREHENSIVE METABOLIC PANEL
ALK PHOS: 66 U/L (ref 38–126)
ALT: 37 U/L (ref 17–63)
AST: 31 U/L (ref 15–41)
Albumin: 4 g/dL (ref 3.5–5.0)
Anion gap: 9 (ref 5–15)
BILIRUBIN TOTAL: 0.7 mg/dL (ref 0.3–1.2)
BUN: 15 mg/dL (ref 6–20)
CALCIUM: 8.9 mg/dL (ref 8.9–10.3)
CHLORIDE: 107 mmol/L (ref 101–111)
CO2: 25 mmol/L (ref 22–32)
Creatinine, Ser: 1.01 mg/dL (ref 0.61–1.24)
GFR calc Af Amer: >60 mL/min (ref >60)
GFR calc non Af Amer: >60 mL/min (ref >60)
Glucose, Bld: 197 mg/dL — ABNORMAL HIGH (ref 65–99)
Potassium: 3.4 mmol/L — ABNORMAL LOW (ref 3.5–5.1)
Sodium: 141 mmol/L (ref 135–145)
Total Protein: 7.3 g/dL (ref 6.5–8.1)

## 2015-06-21 LAB — TROPONIN I: Troponin I: 0.06 ng/mL — ABNORMAL HIGH (ref <0.031)

## 2015-06-21 LAB — LIPASE, BLOOD: Lipase: 19 U/L — ABNORMAL LOW (ref 22–51)

## 2015-06-21 MED ORDER — PROMETHAZINE HCL 25 MG PO TABS: 25.0000 mg | ORAL_TABLET | Freq: Four times a day (QID) | ORAL | Status: DC | PRN

## 2015-06-21 MED ORDER — FAMOTIDINE 20 MG PO TABS
20.0000 mg | ORAL_TABLET | Freq: Once | ORAL | Status: AC
  Administered 2015-06-21: 20 mg via ORAL
  Filled 2015-06-21: qty 1

## 2015-06-21 MED ORDER — ALBUTEROL SULFATE HFA 108 (90 BASE) MCG/ACT IN AERS: 2.0000 | INHALATION_SPRAY | RESPIRATORY_TRACT | Status: AC | PRN

## 2015-06-21 MED ORDER — FAMOTIDINE 20 MG PO TABS: 20.0000 mg | ORAL_TABLET | Freq: Two times a day (BID) | ORAL | Status: DC

## 2015-06-21 MED ORDER — IRBESARTAN 150 MG PO TABS
150.0000 mg | ORAL_TABLET | Freq: Every day | ORAL | Status: DC
  Administered 2015-06-21: 150 mg via ORAL
  Filled 2015-06-21 (×2): qty 1

## 2015-06-21 MED ORDER — PROMETHAZINE HCL 25 MG/ML IJ SOLN
25.0000 mg | Freq: Once | INTRAMUSCULAR | Status: AC
  Administered 2015-06-21: 25 mg via INTRAVENOUS
  Filled 2015-06-21: qty 1

## 2015-06-21 NOTE — ED Notes (Signed)
Returned from Xray at this time.

## 2015-06-21 NOTE — Discharge Instructions (Signed)
Nausea and Vomiting Nausea means you feel sick to your stomach. Throwing up (vomiting) is a reflex where stomach contents come out of your mouth. HOME CARE   Take medicine as told by your doctor.  Do not force yourself to eat. However, you do need to drink fluids.  If you feel like eating, eat a normal diet as told by your doctor.  Eat rice, wheat, potatoes, bread, lean meats, yogurt, fruits, and vegetables.  Avoid high-fat foods.  Drink enough fluids to keep your pee (urine) clear or pale yellow.  Ask your doctor how to replace body fluid losses (rehydrate). Signs of body fluid loss (dehydration) include:  Feeling very thirsty.  Dry lips and mouth.  Feeling dizzy.  Dark pee.  Peeing less than normal.  Feeling confused.  Fast breathing or heart rate. GET HELP RIGHT AWAY IF:   You have blood in your throw up.  You have black or bloody poop (stool).  You have a bad headache or stiff neck.  You feel confused.  You have bad belly (abdominal) pain.  You have chest pain or trouble breathing.  You do not pee at least once every 8 hours.  You have cold, clammy skin.  You keep throwing up after 24 to 48 hours.  You have a fever. MAKE SURE YOU:   Understand these instructions.  Will watch your condition.  Will get help right away if you are not doing well or get worse. Document Released: 04/28/2008 Document Revised: 02/02/2012 Document Reviewed: 04/11/2011 [facility] Patient Information 2015 [place], [place]. This information is not intended to replace advice given to you by your health care provider. Make sure you discuss any questions you have with your health care provider.  Pneumonia Pneumonia is an infection of the lungs.  CAUSES Pneumonia may be caused by bacteria or a virus. Usually, these infections are caused by breathing infectious particles into the lungs (respiratory tract). SIGNS AND SYMPTOMS   Cough.  Fever.  Chest pain.  Increased rate of  breathing.  Wheezing.  Mucus production. DIAGNOSIS  If you have the common symptoms of pneumonia, your health care provider will typically confirm the diagnosis with a chest X-ray. The X-ray will show an abnormality in the lung (pulmonary infiltrate) if you have pneumonia. Other tests of your blood, urine, or sputum may be done to find the specific cause of your pneumonia. Your health care provider may also do tests (blood gases or pulse oximetry) to see how well your lungs are working. TREATMENT  Some forms of pneumonia may be spread to other people when you cough or sneeze. You may be asked to wear a mask before and during your exam. Pneumonia that is caused by bacteria is treated with antibiotic medicine. Pneumonia that is caused by the influenza virus may be treated with an antiviral medicine. Most other viral infections must run their course. These infections will not respond to antibiotics.  HOME CARE INSTRUCTIONS   Cough suppressants may be used if you are losing too much rest. However, coughing protects you by clearing your lungs. You should avoid using cough suppressants if you can.  Your health care provider may have prescribed medicine if he or she thinks your pneumonia is caused by bacteria or influenza. Finish your medicine even if you start to feel better.  Your health care provider may also prescribe an expectorant. This loosens the mucus to be coughed up.  Take medicines only as directed by your health care provider.  Do not smoke.  Smoking is a common cause of bronchitis and can contribute to pneumonia. If you are a smoker and continue to smoke, your cough may last several weeks after your pneumonia has cleared.  A cold steam vaporizer or humidifier in your room or home may help loosen mucus.  Coughing is often worse at night. Sleeping in a semi-upright position in a recliner or using a couple pillows under your head will help with this.  Get rest as you feel it is needed.  Your body will usually let you know when you need to rest. PREVENTION A pneumococcal shot (vaccine) is available to prevent a common bacterial cause of pneumonia. This is usually suggested for:  People over 31 years old.  Patients on chemotherapy.  People with chronic lung problems, such as bronchitis or emphysema.  People with immune system problems. If you are over 65 or have a high risk condition, you may receive the pneumococcal vaccine if you have not received it before. In some countries, a routine influenza vaccine is also recommended. This vaccine can help prevent some cases of pneumonia.You may be offered the influenza vaccine as part of your care. If you smoke, it is time to quit. You may receive instructions on how to stop smoking. Your health care provider can provide medicines and counseling to help you quit. SEEK MEDICAL CARE IF: You have a fever. SEEK IMMEDIATE MEDICAL CARE IF:   Your illness becomes worse. This is especially true if you are elderly or weakened from any other disease.  You cannot control your cough with suppressants and are losing sleep.  You begin coughing up blood.  You develop pain which is getting worse or is uncontrolled with medicines.  Any of the symptoms which initially brought you in for treatment are getting worse rather than better.  You develop shortness of breath or chest pain. MAKE SURE YOU:   Understand these instructions.  Will watch your condition.  Will get help right away if you are not doing well or get worse. Document Released: 11/10/2005 Document Revised: 03/27/2014 Document Reviewed: 01/30/2011 [facility] Patient Information 2015 [place], [place]. This information is not intended to replace advice given to you by your health care provider. Make sure you discuss any questions you have with your health care provider.  Your lab tests and your chest x-ray are stable today.  Your chest x-ray indicates that you pneumonia is actually  improving so we recommend that you complete your course of Levaquin since it is working for your infection.  You have been prescribed medications to help you with the side effects of the Levaquin which will hopefully help you feel much better while you are recovering from this infection.  If you continue to have reflux   while taking Pepcid, you may add Maalox or Mylanta additionally to your treatment.

## 2015-06-21 NOTE — ED Notes (Signed)
Pt reports being in hospital from Friday-Tuesday for bilateral pneumonia, pt reports "throwing up green and white stuff and the medicine is making him really dizzy".  Pt reports he feels like he cannot breathe on his left side when he lays down. Pt has been taking levaquin since yesterday.  Pt alert and oriented.

## 2015-06-21 NOTE — ED Provider Notes (Signed)
CSN: [id number]     Arrival date & time 06/21/15  0810 History   First MD Initiated Contact with Patient 06/21/15 [phone]     Chief Complaint  Patient presents with  . Emesis     (Consider location/radiation/quality/duration/timing/severity/associated sxs/prior Treatment) The history is provided by the patient.   Geoffrey Jackson is a 49 y.o. male with a past medical history of diabetes, hypertension, hyperlipidemia and recent diagnosis of bilateral pneumonia for which he was discharged from this hospital 2 days ago presenting with a 24-hour history of vomiting.  He reports approximately 7-8 episodes of vomiting both green and white vomitus.  He describes he can only lay down for about 5 minutes before he starts feeling pressure in his chest and vomit at the back of his throat before he is engaging and vomiting.  He has no worsened short of breath today but does still endorse left-sided chest pressure which is unchanged from his recent hospitalization.  He also endorses intermittent wheezing.  He has been afebrile.  He denies abdominal pain, diarrhea.  He also describes lightheadedness with emesis episodes.  He believes his symptoms are a result of his Levaquin which he started taking yesterday.  He has found no alleviators her symptoms.  He still endorses shortness of breath with exertion, but not worsened since recent hospitalization.  He has had no pain or edema in his extremities.     Past Medical History  Diagnosis Date  . GERD (gastroesophageal reflux disease)   . Hyperlipidemia   . Hypertension   . Low back pain   . Benign prostatic hypertrophy   . Diabetes mellitus without complication   . GERD (gastroesophageal reflux disease)   . Obesity    Past Surgical History  Procedure Laterality Date  . A spinal surgery      Dr Danielle Dess   . Shoulder arthroscopy    . Shoulder arthroscopy  05/21/2012    Procedure: ARTHROSCOPY SHOULDER;  Surgeon: [name], MD;  Location: [facility];   Service: Orthopedics;  Laterality: Right;  . Subacromial decompression  05/21/2012    Procedure: SUBACROMIAL DECOMPRESSION;  Surgeon: [name], MD;  Location: [facility];  Service: Orthopedics;  Laterality: Right;  . Bicept tenodesis Right 11/10/2014    Procedure: BICEPS TENODESIS;  Surgeon: [name], MD;  Location: [facility];  Service: Orthopedics;  Laterality: Right;  biceps tendon   History reviewed. No pertinent family history. History  Substance Use Topics  . Smoking status: Never Smoker   . Smokeless tobacco: Not on file  . Alcohol Use: Yes     Comment: occasionally    Review of Systems  Constitutional: Negative for fever.  HENT: Negative for congestion and sore throat.   Eyes: Negative.   Respiratory: Positive for chest tightness, shortness of breath and wheezing.   Cardiovascular: Negative for chest pain, palpitations and leg swelling.  Gastrointestinal: Positive for nausea and vomiting. Negative for abdominal pain, diarrhea and abdominal distention.  Genitourinary: Negative.   Musculoskeletal: Negative for joint swelling, arthralgias and neck pain.  Skin: Negative.  Negative for rash and wound.  Neurological: Negative for dizziness, weakness, light-headedness, numbness and headaches.  Psychiatric/Behavioral: Negative.       Allergies  Demerol and Lansoprazole  Home Medications   Prior to Admission medications   Medication Sig Start Date End Date Taking? Authorizing Provider  aspirin EC 325 MG EC tablet Take 1 tablet (325 mg total) by mouth daily. 04/14/13  Yes Oval Linsey, MD  BYSTOLIC 5 MG tablet Take 1 tablet by mouth daily. 10/09/14  Yes Historical Provider, MD  cloNIDine (CATAPRES) 0.2 MG tablet Take 1 tablet by mouth 2 (two) times daily. 04/30/15  Yes Historical Provider, MD  Insulin Glargine (TOUJEO SOLOSTAR [place]) Inject 40 Units into the skin at bedtime.    Yes Historical Provider, MD  levofloxacin (LEVAQUIN) 750 MG tablet Take 1 tablet (750 mg  total) by mouth daily. 06/19/15  Yes Oval Linsey, MD  metFORMIN (GLUCOPHAGE) 500 MG tablet Take 500 mg by mouth 2 (two) times daily with a meal.    Yes Historical Provider, MD  nitroGLYCERIN (NITROSTAT) 0.4 MG SL tablet Place 1 tablet (0.4 mg total) under the tongue every 5 (five) minutes as needed for chest pain. 06/19/15  Yes Oval Linsey, MD  olmesartan (BENICAR) 20 MG tablet Take 1 tablet (20 mg total) by mouth daily. 06/19/15  Yes Oval Linsey, MD  pravastatin (PRAVACHOL) 40 MG tablet Take 40 mg by mouth daily.   Yes Historical Provider, MD  famotidine (PEPCID) 20 MG tablet Take 1 tablet (20 mg total) by mouth 2 (two) times daily. 06/21/15   Burgess Amor, PA-C  promethazine (PHENERGAN) 25 MG tablet Take 1 tablet (25 mg total) by mouth every 6 (six) hours as needed for nausea or vomiting. 06/21/15   Burgess Amor, PA-C   BP 165/97 mmHg  Pulse 63  Resp 0  Ht 6\' 1"  (1.854 m)  Wt 265 lb (120.203 kg)  BMI 34.97 kg/m2  SpO2 99% Physical Exam  Constitutional: He appears well-developed and well-nourished.  Anxious.  HENT:  Head: Normocephalic and atraumatic.  Eyes: Conjunctivae are normal.  Neck: Normal range of motion.  Cardiovascular: Normal rate, regular rhythm, normal heart sounds and intact distal pulses.   Pulmonary/Chest: Effort normal and breath sounds normal. He has no wheezes.  Abdominal: Soft. Bowel sounds are normal. There is no tenderness. There is no guarding.  Musculoskeletal: Normal range of motion. He exhibits no edema or tenderness.  Neurological: He is alert.  Skin: Skin is warm and dry.  Psychiatric: He has a normal mood and affect.  Nursing note and vitals reviewed.   ED Course  Procedures (including critical care time) Labs Review Labs Reviewed  COMPREHENSIVE METABOLIC PANEL - Abnormal; Notable for the following:    Potassium 3.4 (*)    Glucose, Bld 197 (*)    All other components within normal limits  LIPASE, BLOOD - Abnormal; Notable for the following:     Lipase 19 (*)    All other components within normal limits  TROPONIN I - Abnormal; Notable for the following:    Troponin I 0.06 (*)    All other components within normal limits  CBC WITH DIFFERENTIAL/PLATELET    Imaging Review Dg Chest 2 View  06/21/2015   CLINICAL DATA:  Weakness  EXAM: CHEST - 2 VIEW  COMPARISON:  06/15/2015  FINDINGS: Cardiac shadow is within normal limits. The lungs are well aerated bilaterally. The previously seen patchy changes have resolved in the interval. No focal effusion or infiltrate is seen.  IMPRESSION: No acute abnormality noted.   Electronically Signed   By: Alcide Clever M.D.   On: 06/21/2015 09:19     EKG Interpretation   Date/Time:  Thursday June 21 2015 08:46:55 EDT Ventricular Rate:  73 PR Interval:  159 QRS Duration: 95 QT Interval:  451 QTC Calculation: 497 R Axis:   107 Text Interpretation:  Sinus rhythm Abnormal R-wave progression, late  transition Baseline wander  in lead(s) V6 Confirmed by Fayrene Fearing  MD, MARK  [phone]) on 06/21/2015 10:10:06 AM      MDM   Final diagnoses:  Medication side effect, initial encounter  Community acquired pneumonia    Suspect new onset nausea, emesis and what sounds like nocturnal reflux is possible side effect to Levaquin.  Labs and x-rays reviewed with improvement in chest x-ray.  Troponin remains slightly elevated as it has during his admission, EKG is stable.  Patient was advised that he should complete his Levaquin course as it is improving his pneumonia.  He was prescribed Pepcid and Phenergan to help with side effects from this antibiotic.  He was also encouraged either Maalox or Mylanta if he has breakthrough reflux.  Consider PPI, however patient is allergic to lansoprazole but is unable to recall the nature of this allergy.  I do not want to increase risk for additional side effects or allergic reaction by adding a PPI.  Plan follow-up with PCP, returning here sooner for any worsened symptoms.  He was  encouraged to rest and make sure he is drinking plenty of fluids, activity as tolerated as he continues to recover from this infection.  The patient appears reasonably screened and/or stabilized for discharge and I doubt any other medical condition or other [facility] requiring further screening, evaluation, or treatment in the ED at this time prior to discharge.     Burgess Amor, PA-C 06/21/15 1033  Additionally hard to discharge patient was given a prescription for albuterol for when necessary use as he states he has intermittent wheezing, although he had none on exam today.  Burgess Amor, PA-C 06/21/15 1046  Rolland Porter, MD 06/23/15 [phone]

## 2015-06-24 ENCOUNTER — Other Ambulatory Visit: Payer: Self-pay | Admitting: Orthopedic Surgery

## 2015-07-02 ENCOUNTER — Telehealth: Payer: Self-pay | Admitting: Orthopedic Surgery

## 2015-07-02 ENCOUNTER — Other Ambulatory Visit: Payer: Self-pay | Admitting: *Deleted

## 2015-07-02 DIAGNOSIS — M25511 Pain in right shoulder: Secondary | ICD-10-CM

## 2015-07-02 MED ORDER — HYDROCODONE-ACETAMINOPHEN 7.5-325 MG PO TABS: 1.0000 | ORAL_TABLET | ORAL | Status: DC | PRN

## 2015-07-02 NOTE — Telephone Encounter (Signed)
Prescription available, called patient, no answer 

## 2015-07-02 NOTE — Telephone Encounter (Signed)
Patient called for refill of pain medication - Oxycodone/acetoph 5-325 ?   A note appears to have been entered on 06/24/15 for refill - please advise.  Patient ph# [phone]

## 2015-07-02 NOTE — Telephone Encounter (Signed)
Patient called back; aware ready. Prescription picked up.

## 2015-07-03 ENCOUNTER — Other Ambulatory Visit: Payer: Self-pay | Admitting: *Deleted

## 2015-07-03 MED ORDER — IBUPROFEN 800 MG PO TABS: 800.0000 mg | ORAL_TABLET | Freq: Three times a day (TID) | ORAL | Status: DC | PRN

## 2015-08-09 ENCOUNTER — Telehealth: Payer: Self-pay | Admitting: Orthopedic Surgery

## 2015-08-09 NOTE — Telephone Encounter (Signed)
Patient is calling requesting a refill on Oxycodone/acetoph 5-325 please advise?

## 2015-08-10 ENCOUNTER — Other Ambulatory Visit: Payer: Self-pay | Admitting: *Deleted

## 2015-08-10 DIAGNOSIS — M25511 Pain in right shoulder: Secondary | ICD-10-CM

## 2015-08-10 MED ORDER — HYDROCODONE-ACETAMINOPHEN 7.5-325 MG PO TABS: 1.0000 | ORAL_TABLET | ORAL | Status: DC | PRN

## 2015-08-13 NOTE — Telephone Encounter (Signed)
Patient picked up Rx

## 2015-08-13 NOTE — Telephone Encounter (Signed)
Prescription available, patient aware  

## 2015-08-30 ENCOUNTER — Other Ambulatory Visit: Payer: Self-pay | Admitting: *Deleted

## 2015-08-30 ENCOUNTER — Telehealth: Payer: Self-pay | Admitting: Orthopedic Surgery

## 2015-08-30 ENCOUNTER — Ambulatory Visit: Payer: BC Managed Care – PPO | Admitting: Orthopedic Surgery

## 2015-08-30 DIAGNOSIS — M25511 Pain in right shoulder: Secondary | ICD-10-CM

## 2015-08-30 MED ORDER — HYDROCODONE-ACETAMINOPHEN 7.5-325 MG PO TABS: 1.0000 | ORAL_TABLET | ORAL | Status: DC | PRN

## 2015-08-30 NOTE — Telephone Encounter (Signed)
Prescription is ready for pick up, Patient is aware

## 2015-08-30 NOTE — Telephone Encounter (Signed)
Patient requests refill on medication, Hydrocodone acetaminophen/Norco 7.5/325.  He was planning to request at his appointment today, however, due to work, has had to re-schedule appointment. Please advise. Ph# [phone]

## 2015-08-30 NOTE — Telephone Encounter (Signed)
Patient picked up Rx

## 2015-09-11 ENCOUNTER — Ambulatory Visit (INDEPENDENT_AMBULATORY_CARE_PROVIDER_SITE_OTHER): Payer: BC Managed Care – PPO | Admitting: Orthopedic Surgery

## 2015-09-11 VITALS — BP 190/116 | Ht 73.0 in | Wt 265.0 lb

## 2015-09-11 DIAGNOSIS — Z9889 Other specified postprocedural states: Secondary | ICD-10-CM

## 2015-09-11 DIAGNOSIS — M7521 Bicipital tendinitis, right shoulder: Secondary | ICD-10-CM

## 2015-09-11 DIAGNOSIS — M25511 Pain in right shoulder: Secondary | ICD-10-CM

## 2015-09-11 DIAGNOSIS — M62838 Other muscle spasm: Secondary | ICD-10-CM

## 2015-09-11 MED ORDER — HYDROCODONE-ACETAMINOPHEN 7.5-325 MG PO TABS: 1.0000 | ORAL_TABLET | ORAL | Status: DC | PRN

## 2015-09-11 MED ORDER — CYCLOBENZAPRINE HCL 10 MG PO TABS: 10.0000 mg | ORAL_TABLET | Freq: Three times a day (TID) | ORAL | Status: DC | PRN

## 2015-09-11 NOTE — Progress Notes (Signed)
[name] has had biceps tenodesis after having right rotator cuff repair after having right shoulder surgery for instability  He has had chronic pain in the right shoulder we sent him for nerve conduction study and he came back with some mild left carpal tunnel symptoms but nothing on the right arm. He's also had lumbar disc surgery complains of some lower back pain which I've advised him to see Dr. Danielle DessElsner  He complains of biceps spasms with activity  He has some tenderness at the biceps tenodesis site with x-ray did before and found no abnormalities he had interference screw fixation there  At this point I would advise he try a TENS unit he is on ibuprofen he is on 10 mg of Norco and occasionally it looks like his records from Dr.DOONQUAHindicate some Percocet for pain but we've stuck with the Norco and try to actually get him down in dosing with 7.5 mg  Examination today is tender at the biceps tenodesis side and in the biceps muscle has full range of motion shoulder and elbow motor exam shows mild supination weakness shoulder stable elbow safe stable no neurovascular deficits  Recommend TENS unit  Meds ordered this encounter  Medications  . HYDROcodone-acetaminophen (NORCO) 7.5-325 MG tablet    Sig: Take 1 tablet by mouth every 4 (four) hours as needed.    Dispense:  60 tablet    Refill:  0  . cyclobenzaprine (FLEXERIL) 10 MG tablet    Sig: Take 1 tablet (10 mg total) by mouth 3 (three) times daily as needed. For muscle spasms    Dispense:  60 tablet    Refill:  5

## 2015-09-25 ENCOUNTER — Telehealth: Payer: Self-pay | Admitting: *Deleted

## 2015-09-25 ENCOUNTER — Other Ambulatory Visit: Payer: Self-pay | Admitting: *Deleted

## 2015-09-25 DIAGNOSIS — M25511 Pain in right shoulder: Secondary | ICD-10-CM

## 2015-09-25 MED ORDER — HYDROCODONE-ACETAMINOPHEN 7.5-325 MG PO TABS: 1.0000 | ORAL_TABLET | ORAL | Status: DC | PRN

## 2015-09-25 NOTE — Telephone Encounter (Signed)
Call received from patient requesting refill on Norco 7.5 mg. [phone]

## 2015-09-26 NOTE — Telephone Encounter (Signed)
Prescription available, called patient, no answer 

## 2015-10-02 NOTE — Telephone Encounter (Signed)
Patient picked up prescription.

## 2015-10-25 ENCOUNTER — Other Ambulatory Visit: Payer: Self-pay | Admitting: *Deleted

## 2015-10-25 ENCOUNTER — Telehealth: Payer: Self-pay | Admitting: *Deleted

## 2015-10-25 DIAGNOSIS — M25511 Pain in right shoulder: Secondary | ICD-10-CM

## 2015-10-25 MED ORDER — HYDROCODONE-ACETAMINOPHEN 7.5-325 MG PO TABS: 1.0000 | ORAL_TABLET | ORAL | Status: DC | PRN

## 2015-10-25 NOTE — Telephone Encounter (Signed)
Patient called requesting his hydrocodone to be refilled. Please advise [phone]

## 2015-10-25 NOTE — Telephone Encounter (Signed)
Prescription available, called patient, no answer 

## 2015-10-30 NOTE — Telephone Encounter (Signed)
Patient picked up Rx 10/25/15.

## 2015-11-14 ENCOUNTER — Telehealth: Payer: Self-pay | Admitting: *Deleted

## 2015-11-14 ENCOUNTER — Other Ambulatory Visit: Payer: Self-pay | Admitting: *Deleted

## 2015-11-14 DIAGNOSIS — M25511 Pain in right shoulder: Secondary | ICD-10-CM

## 2015-11-14 MED ORDER — HYDROCODONE-ACETAMINOPHEN 7.5-325 MG PO TABS: 1.0000 | ORAL_TABLET | ORAL | Status: DC | PRN

## 2015-11-14 NOTE — Telephone Encounter (Addendum)
Patient called requesting a refill on Hydrocodone. Please advise [phone]

## 2015-11-15 NOTE — Telephone Encounter (Signed)
Patient picked up prescription.

## 2015-11-15 NOTE — Telephone Encounter (Signed)
Patient is aware that his prescription is ready to be picked up

## 2015-12-03 ENCOUNTER — Other Ambulatory Visit: Payer: Self-pay | Admitting: *Deleted

## 2015-12-03 ENCOUNTER — Telehealth: Payer: Self-pay | Admitting: *Deleted

## 2015-12-03 DIAGNOSIS — M25511 Pain in right shoulder: Secondary | ICD-10-CM

## 2015-12-03 MED ORDER — HYDROCODONE-ACETAMINOPHEN 7.5-325 MG PO TABS: 1.0000 | ORAL_TABLET | ORAL | Status: DC | PRN

## 2015-12-03 NOTE — Telephone Encounter (Signed)
Patient called requesting hydrocodone to be refilled. Please advise  

## 2015-12-04 ENCOUNTER — Other Ambulatory Visit: Payer: Self-pay | Admitting: Orthopedic Surgery

## 2015-12-04 ENCOUNTER — Encounter: Payer: Self-pay | Admitting: Orthopedic Surgery

## 2015-12-04 DIAGNOSIS — M25511 Pain in right shoulder: Secondary | ICD-10-CM

## 2015-12-04 MED ORDER — HYDROCODONE-ACETAMINOPHEN 7.5-325 MG PO TABS: 1.0000 | ORAL_TABLET | Freq: Four times a day (QID) | ORAL | Status: DC | PRN

## 2015-12-04 NOTE — Telephone Encounter (Signed)
Patient has collected prescription and letter.

## 2015-12-13 ENCOUNTER — Ambulatory Visit (INDEPENDENT_AMBULATORY_CARE_PROVIDER_SITE_OTHER): Payer: BC Managed Care – PPO | Admitting: Orthopedic Surgery

## 2015-12-13 VITALS — BP 190/113 | Ht 73.0 in | Wt 262.0 lb

## 2015-12-13 DIAGNOSIS — Z9889 Other specified postprocedural states: Secondary | ICD-10-CM

## 2015-12-13 DIAGNOSIS — M79601 Pain in right arm: Secondary | ICD-10-CM

## 2015-12-13 DIAGNOSIS — M25511 Pain in right shoulder: Secondary | ICD-10-CM

## 2015-12-13 MED ORDER — INDOMETHACIN 25 MG PO CAPS: 25.0000 mg | ORAL_CAPSULE | Freq: Two times a day (BID) | ORAL | Status: DC

## 2015-12-13 NOTE — Progress Notes (Signed)
Chief Complaint  Patient presents with  . Follow-up    3 month follow up Rt arm numbness    Geoffrey Jackson and review has chronic right shoulder pain  Said multiple procedures procedures on the right shoulder he had an arthroscopy of the right shoulder for instability then had right rotator cuff repair then had a biceps tenodesis he complains of aching pain and fatigue in the right arm  He is using a TENS unit is using Norco 7.5 he tried ibuprofen he did not get good relief  Pain is not severe but constant and aching  Review of systems negative  Exam unremarkable near full range of motion in the shoulder no weakness tenderness near the biceps tenodesis site oriented 3 mood normal general appearance normal vital signs are stable  BP 190/113 mmHg  Ht  (1.854 m)  Wt 262 lb (118.842 kg)  BMI 34.57 kg/m2  Recommend Indocin for a few weeks and see if that does better than ibuprofen follow-up 3 months

## 2015-12-17 ENCOUNTER — Emergency Department (HOSPITAL_COMMUNITY): Payer: BC Managed Care – PPO

## 2015-12-17 ENCOUNTER — Observation Stay (HOSPITAL_COMMUNITY)
Admission: EM | Admit: 2015-12-17 | Discharge: 2015-12-19 | Disposition: A | Discharge status: Home / Self Care | Payer: BC Managed Care – PPO | Attending: Family Medicine | Admitting: Family Medicine

## 2015-12-17 ENCOUNTER — Encounter (HOSPITAL_COMMUNITY): Payer: Self-pay | Admitting: *Deleted

## 2015-12-17 DIAGNOSIS — Z79899 Other long term (current) drug therapy: Secondary | ICD-10-CM | POA: Insufficient documentation

## 2015-12-17 DIAGNOSIS — R519 Headache, unspecified: Secondary | ICD-10-CM | POA: Diagnosis present

## 2015-12-17 DIAGNOSIS — Z794 Long term (current) use of insulin: Secondary | ICD-10-CM | POA: Diagnosis not present

## 2015-12-17 DIAGNOSIS — R7989 Other specified abnormal findings of blood chemistry: Secondary | ICD-10-CM | POA: Diagnosis not present

## 2015-12-17 DIAGNOSIS — E785 Hyperlipidemia, unspecified: Secondary | ICD-10-CM | POA: Diagnosis not present

## 2015-12-17 DIAGNOSIS — N400 Enlarged prostate without lower urinary tract symptoms: Secondary | ICD-10-CM | POA: Insufficient documentation

## 2015-12-17 DIAGNOSIS — K219 Gastro-esophageal reflux disease without esophagitis: Secondary | ICD-10-CM | POA: Diagnosis present

## 2015-12-17 DIAGNOSIS — I161 Hypertensive emergency: Secondary | ICD-10-CM | POA: Diagnosis present

## 2015-12-17 DIAGNOSIS — Z7984 Long term (current) use of oral hypoglycemic drugs: Secondary | ICD-10-CM | POA: Diagnosis not present

## 2015-12-17 DIAGNOSIS — R778 Other specified abnormalities of plasma proteins: Secondary | ICD-10-CM

## 2015-12-17 DIAGNOSIS — R9431 Abnormal electrocardiogram [ECG] [EKG]: Secondary | ICD-10-CM

## 2015-12-17 DIAGNOSIS — K2100 Gastro-esophageal reflux disease with esophagitis, without bleeding: Secondary | ICD-10-CM

## 2015-12-17 DIAGNOSIS — S46211A Strain of muscle, fascia and tendon of other parts of biceps, right arm, initial encounter: Secondary | ICD-10-CM

## 2015-12-17 DIAGNOSIS — R0789 Other chest pain: Secondary | ICD-10-CM

## 2015-12-17 DIAGNOSIS — G44001 Cluster headache syndrome, unspecified, intractable: Secondary | ICD-10-CM

## 2015-12-17 DIAGNOSIS — R197 Diarrhea, unspecified: Secondary | ICD-10-CM | POA: Diagnosis not present

## 2015-12-17 DIAGNOSIS — E669 Obesity, unspecified: Secondary | ICD-10-CM | POA: Diagnosis not present

## 2015-12-17 DIAGNOSIS — E119 Type 2 diabetes mellitus without complications: Secondary | ICD-10-CM | POA: Insufficient documentation

## 2015-12-17 DIAGNOSIS — E1169 Type 2 diabetes mellitus with other specified complication: Secondary | ICD-10-CM

## 2015-12-17 DIAGNOSIS — I160 Hypertensive urgency: Secondary | ICD-10-CM | POA: Diagnosis present

## 2015-12-17 DIAGNOSIS — R51 Headache: Secondary | ICD-10-CM | POA: Diagnosis not present

## 2015-12-17 DIAGNOSIS — I10 Essential (primary) hypertension: Secondary | ICD-10-CM | POA: Diagnosis not present

## 2015-12-17 DIAGNOSIS — R112 Nausea with vomiting, unspecified: Secondary | ICD-10-CM | POA: Diagnosis present

## 2015-12-17 DIAGNOSIS — R079 Chest pain, unspecified: Secondary | ICD-10-CM | POA: Diagnosis present

## 2015-12-17 HISTORY — DX: Essential (primary) hypertension: I10

## 2015-12-17 HISTORY — DX: Obstructive sleep apnea (adult) (pediatric): G47.33

## 2015-12-17 HISTORY — DX: Type 2 diabetes mellitus without complications: E11.9

## 2015-12-17 HISTORY — DX: Patient's other noncompliance with medication regimen: Z91.14

## 2015-12-17 HISTORY — DX: Patient's other noncompliance with medication regimen for other reason: Z91.148

## 2015-12-17 LAB — CBC WITH DIFFERENTIAL/PLATELET
BASOS ABS: 0 10*3/uL (ref 0.0–0.1)
Basophils Relative: 0 %
Eosinophils Absolute: 0.1 10*3/uL (ref 0.0–0.7)
Eosinophils Relative: 2 %
HEMATOCRIT: 43.7 % (ref 39.0–52.0)
HEMOGLOBIN: 14.8 g/dL (ref 13.0–17.0)
LYMPHS PCT: 51 %
Lymphs Abs: 2.2 10*3/uL (ref 0.7–4.0)
MCH: 30.1 pg (ref 26.0–34.0)
MCHC: 33.9 g/dL (ref 30.0–36.0)
MCV: 89 fL (ref 78.0–100.0)
MONO ABS: 0.5 10*3/uL (ref 0.1–1.0)
MONOS PCT: 11 %
Neutro Abs: 1.6 10*3/uL — ABNORMAL LOW (ref 1.7–7.7)
Neutrophils Relative %: 36 %
PLATELETS: 192 10*3/uL (ref 150–400)
RBC: 4.91 MIL/uL (ref 4.22–5.81)
RDW: 12.7 % (ref 11.5–15.5)
WBC: 4.4 10*3/uL (ref 4.0–10.5)

## 2015-12-17 LAB — BASIC METABOLIC PANEL
ANION GAP: 8 (ref 5–15)
BUN: 14 mg/dL (ref 6–20)
CHLORIDE: 109 mmol/L (ref 101–111)
CO2: 25 mmol/L (ref 22–32)
Calcium: 8.8 mg/dL — ABNORMAL LOW (ref 8.9–10.3)
Creatinine, Ser: 0.95 mg/dL (ref 0.61–1.24)
GFR calc Af Amer: >60 mL/min (ref >60)
GFR calc non Af Amer: >60 mL/min (ref >60)
Glucose, Bld: 196 mg/dL — ABNORMAL HIGH (ref 65–99)
Potassium: 3.2 mmol/L — ABNORMAL LOW (ref 3.5–5.1)
Sodium: 142 mmol/L (ref 135–145)

## 2015-12-17 LAB — TROPONIN I
TROPONIN I: 0.11 ng/mL — AB (ref <0.031)
Troponin I: 0.11 ng/mL — ABNORMAL HIGH (ref <0.031)

## 2015-12-17 MED ORDER — FAMOTIDINE 20 MG PO TABS
20.0000 mg | ORAL_TABLET | Freq: Two times a day (BID) | ORAL | Status: DC
  Administered 2015-12-18 – 2015-12-19 (×3): 20 mg via ORAL
  Filled 2015-12-17 (×3): qty 1

## 2015-12-17 MED ORDER — DIPHENHYDRAMINE HCL 50 MG/ML IJ SOLN
25.0000 mg | Freq: Once | INTRAMUSCULAR | Status: AC
  Administered 2015-12-17: 25 mg via INTRAVENOUS
  Filled 2015-12-17: qty 1

## 2015-12-17 MED ORDER — METOCLOPRAMIDE HCL 5 MG/ML IJ SOLN
10.0000 mg | Freq: Once | INTRAMUSCULAR | Status: AC
  Administered 2015-12-17: 10 mg via INTRAVENOUS
  Filled 2015-12-17: qty 2

## 2015-12-17 MED ORDER — ACETAMINOPHEN 325 MG PO TABS
650.0000 mg | ORAL_TABLET | ORAL | Status: DC | PRN
  Administered 2015-12-18 – 2015-12-19 (×2): 650 mg via ORAL
  Filled 2015-12-17 (×2): qty 2

## 2015-12-17 MED ORDER — AMLODIPINE BESYLATE 5 MG PO TABS
10.0000 mg | ORAL_TABLET | Freq: Every day | ORAL | Status: DC
  Administered 2015-12-18 – 2015-12-19 (×2): 10 mg via ORAL
  Filled 2015-12-17 (×2): qty 2

## 2015-12-17 MED ORDER — HYDRALAZINE HCL 20 MG/ML IJ SOLN
10.0000 mg | Freq: Once | INTRAMUSCULAR | Status: AC
  Administered 2015-12-17: 10 mg via INTRAVENOUS
  Filled 2015-12-17: qty 1

## 2015-12-17 MED ORDER — CLONIDINE HCL 0.2 MG PO TABS
0.2000 mg | ORAL_TABLET | Freq: Once | ORAL | Status: AC
  Administered 2015-12-17: 0.2 mg via ORAL
  Filled 2015-12-17: qty 1

## 2015-12-17 MED ORDER — PROMETHAZINE HCL 25 MG/ML IJ SOLN: 25.0000 mg | Freq: Three times a day (TID) | INTRAMUSCULAR | Status: DC | PRN

## 2015-12-17 MED ORDER — GI COCKTAIL ~~LOC~~: 30.0000 mL | Freq: Four times a day (QID) | ORAL | Status: DC | PRN

## 2015-12-17 MED ORDER — INSULIN ASPART 100 UNIT/ML ~~LOC~~ SOLN
0.0000 [IU] | Freq: Three times a day (TID) | SUBCUTANEOUS | Status: DC
Start: 2015-12-18 — End: 2015-12-19
  Administered 2015-12-18: 5 [IU] via SUBCUTANEOUS
  Administered 2015-12-18 – 2015-12-19 (×4): 3 [IU] via SUBCUTANEOUS

## 2015-12-17 MED ORDER — ONDANSETRON HCL 4 MG/2ML IJ SOLN: 4.0000 mg | Freq: Four times a day (QID) | INTRAMUSCULAR | Status: DC | PRN

## 2015-12-17 MED ORDER — TAMSULOSIN HCL 0.4 MG PO CAPS
0.4000 mg | ORAL_CAPSULE | Freq: Every day | ORAL | Status: DC
  Administered 2015-12-18 – 2015-12-19 (×2): 0.4 mg via ORAL
  Filled 2015-12-17 (×2): qty 1

## 2015-12-17 MED ORDER — NITROGLYCERIN 0.4 MG SL SUBL: 0.4000 mg | SUBLINGUAL_TABLET | SUBLINGUAL | Status: DC | PRN

## 2015-12-17 MED ORDER — ASPIRIN 81 MG PO CHEW
324.0000 mg | CHEWABLE_TABLET | Freq: Once | ORAL | Status: AC
  Administered 2015-12-17: 324 mg via ORAL
  Filled 2015-12-17: qty 4

## 2015-12-17 MED ORDER — INSULIN GLARGINE 100 UNIT/ML ~~LOC~~ SOLN
40.0000 [IU] | Freq: Every day | SUBCUTANEOUS | Status: DC
  Administered 2015-12-18: 40 [IU] via SUBCUTANEOUS
  Filled 2015-12-17 (×3): qty 0.4

## 2015-12-17 MED ORDER — NEBIVOLOL HCL 10 MG PO TABS
5.0000 mg | ORAL_TABLET | Freq: Every day | ORAL | Status: DC
Start: 2015-12-18 — End: 2015-12-19
  Administered 2015-12-18 – 2015-12-19 (×2): 5 mg via ORAL
  Filled 2015-12-17 (×2): qty 1

## 2015-12-17 MED ORDER — MORPHINE SULFATE (PF) 2 MG/ML IV SOLN: 2.0000 mg | INTRAVENOUS | Status: DC | PRN

## 2015-12-17 MED ORDER — ALBUTEROL SULFATE (2.5 MG/3ML) 0.083% IN NEBU: 3.0000 mL | INHALATION_SOLUTION | RESPIRATORY_TRACT | Status: DC | PRN

## 2015-12-17 MED ORDER — PRAVASTATIN SODIUM 40 MG PO TABS
40.0000 mg | ORAL_TABLET | Freq: Every day | ORAL | Status: DC
  Administered 2015-12-18 – 2015-12-19 (×2): 40 mg via ORAL
  Filled 2015-12-17 (×2): qty 1

## 2015-12-17 MED ORDER — HYDROCODONE-ACETAMINOPHEN 7.5-325 MG PO TABS
1.0000 | ORAL_TABLET | Freq: Four times a day (QID) | ORAL | Status: DC | PRN
  Administered 2015-12-18 – 2015-12-19 (×3): 1 via ORAL
  Filled 2015-12-17 (×3): qty 1

## 2015-12-17 NOTE — ED Notes (Signed)
Pt states headache began this morning. Vomtied x 2 earlier today. States hypertensive at work. 195/115 and 194/116 and pt states he took BP meds this morning.

## 2015-12-17 NOTE — H&P (Signed)
PCP:   Isabella Stalling, MD   Chief Complaint:  bp high and headache  HPI: 50 yo male h/o HTN, GERD, IDDM comes in with elevated bp all day, associated with nasal congestion (had the flu over a week ago), with some n/v/d today.  Pt reports his kids have been sick with flu like stuff for the last several weeks.  He denies any fevers.  No neck pain.  This am he had a really bad headache that started around 10 am and has persisted with n/v/d x once.  He check is bp and it was elevated more than normal.  He did run out of his meds 2 weeks ago, but has been taking them for over a week now.  Denies any chest pain, abd pain or any other focal neurological deficits.  No rash.  Feels some better with treatment in the ed.  He did have cardiac stress testing in 2014 showing no ischemia, pt has had no cardiac cath in past.  He was offered to do LP in the ED but pt refused as he says "its not cost effective for me" to evaluate for possible sentinel bleed.  i have readdressed the LP with him and he is not sure about this, wants his wife to decide later.  Pt has been given aspirin in the ED for elevation of his troponin.  Referred for admission for hypertensive urgency with mild elevation of his troponin.  Review of Systems:  Positive and negative as per HPI otherwise all other systems are negative  Past Medical History: Past Medical History  Diagnosis Date  . GERD (gastroesophageal reflux disease)   . Hyperlipidemia   . Hypertension   . Low back pain   . Benign prostatic hypertrophy   . Diabetes mellitus without complication (HCC)   . GERD (gastroesophageal reflux disease)   . Obesity    Past Surgical History  Procedure Laterality Date  . A spinal surgery      Dr Danielle Dess   . Shoulder arthroscopy    . Shoulder arthroscopy  05/21/2012    Procedure: ARTHROSCOPY SHOULDER;  Surgeon: [name], MD;  Location: [facility];  Service: Orthopedics;  Laterality: Right;  . Subacromial decompression   05/21/2012    Procedure: SUBACROMIAL DECOMPRESSION;  Surgeon: [name], MD;  Location: [facility];  Service: Orthopedics;  Laterality: Right;  . Bicept tenodesis Right 11/10/2014    Procedure: BICEPS TENODESIS;  Surgeon: [name], MD;  Location: [facility];  Service: Orthopedics;  Laterality: Right;  biceps tendon    Medications: Prior to Admission medications   Medication Sig Start Date End Date Taking? Authorizing Provider  albuterol (PROVENTIL HFA;VENTOLIN HFA) 108 (90 BASE) MCG/ACT inhaler Inhale 2 puffs into the lungs every 4 (four) hours as needed for wheezing or shortness of breath. 06/21/15  Yes Burgess Amor, PA-C  amLODipine (NORVASC) 10 MG tablet Take 10 mg by mouth daily. 11/15/15  Yes Historical Provider, MD  BENICAR 40 MG tablet Take 40 mg by mouth daily.  09/16/15  Yes Historical Provider, MD  BYSTOLIC 5 MG tablet Take 1 tablet by mouth daily. 10/09/14  Yes Historical Provider, MD  cloNIDine (CATAPRES) 0.2 MG tablet Take 1 tablet by mouth 2 (two) times daily. 04/30/15  Yes Historical Provider, MD  cyclobenzaprine (FLEXERIL) 10 MG tablet Take 1 tablet (10 mg total) by mouth 3 (three) times daily as needed. For muscle spasms 09/11/15  Yes [name], MD  famotidine (PEPCID) 20 MG tablet Take  1 tablet (20 mg total) by mouth 2 (two) times daily. 06/21/15  Yes Burgess Amor, PA-C  HYDROcodone-acetaminophen (NORCO) 7.5-325 MG tablet Take 1 tablet by mouth 4 (four) times daily as needed for moderate pain.  12/04/15  Yes Historical Provider, MD  ibuprofen (ADVIL,MOTRIN) 800 MG tablet Take 1 tablet (800 mg total) by mouth every 8 (eight) hours as needed. 07/03/15  Yes [name], MD  Insulin Glargine (TOUJEO SOLOSTAR [place]) Inject 40 Units into the skin at bedtime.    Yes Historical Provider, MD  metFORMIN (GLUCOPHAGE) 500 MG tablet Take 500 mg by mouth 2 (two) times daily with a meal.    Yes Historical Provider, MD  pravastatin (PRAVACHOL) 40 MG tablet Take 40 mg by mouth daily.    Yes Historical Provider, MD  tamsulosin (FLOMAX) 0.4 MG CAPS capsule Take 1 capsule by mouth daily. 11/15/15  Yes Historical Provider, MD  nitroGLYCERIN (NITROSTAT) 0.4 MG SL tablet Place 1 tablet (0.4 mg total) under the tongue every 5 (five) minutes as needed for chest pain. 06/19/15   Oval Linsey, MD    Allergies:   Allergies  Allergen Reactions  . Demerol [Meperidine] Itching  . Lansoprazole Other (See Comments)    unknown    Social History:  reports that he has never smoked. He does not have any smokeless tobacco history on file. He reports that he drinks alcohol. He reports that he does not use illicit drugs.  Family History: No premature CAD  Physical Exam: Filed Vitals:   12/17/15 2130 12/17/15 2145 12/17/15 2227 12/17/15 2231  BP: 159/127   149/100  Pulse: 87 74 87   Temp:   97.9 F (36.6 C)   TempSrc:   Oral   Resp: Height:    (1.854 m)   Weight:    116.257 kg (256 lb 4.8 oz)  SpO2: 97% 99% 96%    General appearance: alert, cooperative and no distress Head: Normocephalic, without obvious abnormality, atraumatic Eyes: negative Nose: Nares normal. Septum midline. Mucosa normal. No drainage or sinus tenderness. Neck: no JVD and supple, symmetrical, trachea midline Lungs: clear to auscultation bilaterally Heart: regular rate and rhythm, S1, S2 normal, no murmur, click, rub or gallop Abdomen: soft, non-tender; bowel sounds normal; no masses,  no organomegaly Extremities: extremities normal, atraumatic, no cyanosis or edema Pulses: 2+ and symmetric Skin: Skin color, texture, turgor normal. No rashes or lesions Neurologic: Grossly normal    Labs on Admission:   Recent Labs  12/17/15 1955  NA 142  K 3.2*  CL 109  CO2 25  GLUCOSE 196*  BUN 14  CREATININE 0.95  CALCIUM 8.8*    Recent Labs  12/17/15 1955  WBC 4.4  NEUTROABS 1.6*  HGB 14.8  HCT 43.7  MCV 89.0  PLT 192    Recent Labs  12/17/15 1955  TROPONINI 0.11*     Radiological Exams on Admission: Dg Chest 2 View  12/17/2015  CLINICAL DATA:  Pt c/o productive cough and headache since this morning. Vomtied x 2 earlier today. States hypertensive at work. 195/115 and 194/116 and pt states he took BP meds this morning. Hx GERD, HTN, diabetes, nonsmoker EXAM: CHEST  2 VIEW COMPARISON:  06/21/2015 FINDINGS: Lungs are mildly hyperinflated. Heart is upper limits normal. There are no focal consolidations or pleural effusions. No pulmonary edema. Thoracic spondylosis present. IMPRESSION: 1. Hyperinflation. 2.  No focal acute pulmonary abnormality. Electronically Signed   By: Norva Pavlov M.D.  On: 12/17/2015 19:49   Ct Head Wo Contrast  12/17/2015  CLINICAL DATA:  Headache since this morning. Vomiting x2. Initial encounter. EXAM: CT HEAD WITHOUT CONTRAST TECHNIQUE: Contiguous axial images were obtained from the base of the skull through the vertex without intravenous contrast. COMPARISON:  Head CT scan 11/01/2006. FINDINGS: The brain appears normal without hemorrhage, infarct, mass lesion, mass effect, midline shift or abnormal extra-axial fluid collection. No hydrocephalus or pneumocephalus. The calvarium is intact. Imaged paranasal sinuses and mastoid air cells are clear. IMPRESSION: Negative head CT. Electronically Signed   By: Drusilla Kanner M.D.   On: 12/17/2015 19:49   ekg reviewed nsr twi 111 and v6 cxr reviewed no infiltrate and or edema Old chart reviewed  Assessment/Plan  50 yo male with hypertensive urgency , mild elevation of troponin possible NSTEMI, with headache  Principal Problem:   Hypertensive urgency- bp better after given clonidine.  Cont home meds.  Monitor.      Active Problems:   Elevated troponin I level- serial trop over night.  Check echo in am.  Obtain cardiology consult for evaluation of need of LHC   Headache- pt refusing LP at this time, given aspirin, will obtain neuro checks q 4 hours.  Ct head neg.  Hesitant to fully  antiocoagulate with heparin products.   Diabetes mellitus type 2 in obese (HCC)-- cont his lantus, place on ssi.  Hold metformin   Hyperlipidemia- noted, check flp in am   Essential hypertension- noted    GERD- noted   Nausea, vomiting, and diarrhea- none since arrival to ED, abd exam normal.  obs on tele.  Full code.    DAVID,RACHAL A 12/17/2015, 10:58 PM

## 2015-12-17 NOTE — ED Provider Notes (Signed)
CSN: [id number]     Arrival date & time 12/17/15  1514 History   First MD Initiated Contact with Patient 12/17/15 1915     Chief Complaint  Patient presents with  . Hypertension  . Headache     (Consider location/radiation/quality/duration/timing/severity/associated sxs/prior Treatment) HPI Comments: Patient reports history of high blood pressure stating compliance with his medications though he missed several 2 weeks ago. States headache began around 9 AM and progressively worsened throughout the day. He had 2 episodes of vomiting and several episodes of diarrhea as well. No fever. No chest pain or shortness of breath. No focal weakness, numbness or tingling. Headache improved with Tylenol and ibuprofen but then became worse again. Endorses photophobia and phonophobia. No blurry or double vision. Did not take his blood pressure medications tonight. History of hypertension, hyperlipidemia, diabetes he is not certain what his blood pressure normally runs.  Patient is a 50 y.o. male presenting with hypertension and headaches. The history is provided by the patient.  Hypertension Associated symptoms include headaches. Pertinent negatives include no abdominal pain and no shortness of breath.  Headache Associated symptoms: diarrhea, nausea and vomiting   Associated symptoms: no abdominal pain, no congestion, no cough, no fever, no myalgias and no weakness     Past Medical History  Diagnosis Date  . GERD (gastroesophageal reflux disease)   . Hyperlipidemia   . Hypertension   . Low back pain   . Benign prostatic hypertrophy   . Diabetes mellitus without complication (HCC)   . GERD (gastroesophageal reflux disease)   . Obesity    Past Surgical History  Procedure Laterality Date  . A spinal surgery      Dr Danielle Dess   . Shoulder arthroscopy    . Shoulder arthroscopy  05/21/2012    Procedure: ARTHROSCOPY SHOULDER;  Surgeon: [name], MD;  Location: [facility];  Service: Orthopedics;   Laterality: Right;  . Subacromial decompression  05/21/2012    Procedure: SUBACROMIAL DECOMPRESSION;  Surgeon: [name], MD;  Location: [facility];  Service: Orthopedics;  Laterality: Right;  . Bicept tenodesis Right 11/10/2014    Procedure: BICEPS TENODESIS;  Surgeon: [name], MD;  Location: [facility];  Service: Orthopedics;  Laterality: Right;  biceps tendon   No family history on file. Social History  Substance Use Topics  . Smoking status: Never Smoker   . Smokeless tobacco: None  . Alcohol Use: Yes     Comment: occasionally    Review of Systems  Constitutional: Negative for fever, activity change and appetite change.  HENT: Negative for congestion and rhinorrhea.   Respiratory: Negative for cough, chest tightness and shortness of breath.   Gastrointestinal: Positive for nausea, vomiting and diarrhea. Negative for abdominal pain.  Genitourinary: Negative for dysuria, urgency and hematuria.  Musculoskeletal: Negative for myalgias and arthralgias.  Skin: Negative for rash.  Neurological: Positive for headaches. Negative for weakness.  A complete 10 system review of systems was obtained and all systems are negative except as noted in the HPI and PMH.      Allergies  Demerol and Lansoprazole  Home Medications   Prior to Admission medications   Medication Sig Start Date End Date Taking? Authorizing Provider  albuterol (PROVENTIL HFA;VENTOLIN HFA) 108 (90 BASE) MCG/ACT inhaler Inhale 2 puffs into the lungs every 4 (four) hours as needed for wheezing or shortness of breath. 06/21/15  Yes Burgess Amor, PA-C  amLODipine (NORVASC) 10 MG tablet Take 10 mg by mouth daily. 11/15/15  Yes  Historical Provider, MD  BENICAR 40 MG tablet Take 40 mg by mouth daily.  09/16/15  Yes Historical Provider, MD  BYSTOLIC 5 MG tablet Take 1 tablet by mouth daily. 10/09/14  Yes Historical Provider, MD  cloNIDine (CATAPRES) 0.2 MG tablet Take 1 tablet by mouth 2 (two) times daily. 04/30/15  Yes  Historical Provider, MD  cyclobenzaprine (FLEXERIL) 10 MG tablet Take 1 tablet (10 mg total) by mouth 3 (three) times daily as needed. For muscle spasms 09/11/15  Yes [name], MD  famotidine (PEPCID) 20 MG tablet Take 1 tablet (20 mg total) by mouth 2 (two) times daily. 06/21/15  Yes Burgess Amor, PA-C  HYDROcodone-acetaminophen (NORCO) 7.5-325 MG tablet Take 1 tablet by mouth 4 (four) times daily as needed for moderate pain.  12/04/15  Yes Historical Provider, MD  ibuprofen (ADVIL,MOTRIN) 800 MG tablet Take 1 tablet (800 mg total) by mouth every 8 (eight) hours as needed. 07/03/15  Yes [name], MD  Insulin Glargine (TOUJEO SOLOSTAR [place]) Inject 40 Units into the skin at bedtime.    Yes Historical Provider, MD  metFORMIN (GLUCOPHAGE) 500 MG tablet Take 500 mg by mouth 2 (two) times daily with a meal.    Yes Historical Provider, MD  pravastatin (PRAVACHOL) 40 MG tablet Take 40 mg by mouth daily.   Yes Historical Provider, MD  tamsulosin (FLOMAX) 0.4 MG CAPS capsule Take 1 capsule by mouth daily. 11/15/15  Yes Historical Provider, MD  nitroGLYCERIN (NITROSTAT) 0.4 MG SL tablet Place 1 tablet (0.4 mg total) under the tongue every 5 (five) minutes as needed for chest pain. 06/19/15   Oval Linsey, MD   BP 149/100 mmHg  Pulse 87  Temp(Src) 97.9 F (36.6 C) (Oral)  Resp 12  Ht  (1.854 m)  Wt 256 lb 4.8 oz (116.257 kg)  BMI 33.82 kg/m2  SpO2 96% Physical Exam  Constitutional: He is oriented to person, place, and time. He appears well-developed and well-nourished. No distress.  HENT:  Head: Normocephalic and atraumatic.  Mouth/Throat: Oropharynx is clear and moist. No oropharyngeal exudate.  Eyes: Conjunctivae and EOM are normal. Pupils are equal, round, and reactive to light.  Neck: Normal range of motion. Neck supple.  No meningismus.  Cardiovascular: Normal rate, regular rhythm, normal heart sounds and intact distal pulses.   No murmur heard. Pulmonary/Chest: Effort  normal and breath sounds normal. No respiratory distress.  Abdominal: Soft. There is no tenderness. There is no rebound and no guarding.  Musculoskeletal: Normal range of motion. He exhibits no edema or tenderness.  Neurological: He is alert and oriented to person, place, and time. No cranial nerve deficit. He exhibits normal muscle tone. Coordination normal.  No ataxia on finger to nose bilaterally. No pronator drift. 5/5 strength throughout. CN 2-12 intact.Equal grip strength. Sensation intact.   Skin: Skin is warm.  Psychiatric: He has a normal mood and affect. His behavior is normal.  Nursing note and vitals reviewed.   ED Course  Procedures (including critical care time) Labs Review Labs Reviewed  CBC WITH DIFFERENTIAL/PLATELET - Abnormal; Notable for the following:    Neutro Abs 1.6 (*)    All other components within normal limits  BASIC METABOLIC PANEL - Abnormal; Notable for the following:    Potassium 3.2 (*)    Glucose, Bld 196 (*)    Calcium 8.8 (*)    All other components within normal limits  TROPONIN I - Abnormal; Notable for the following:    Troponin I 0.11 (*)  All other components within normal limits  TROPONIN I  TROPONIN I  TROPONIN I  HEMOGLOBIN A1C    Imaging Review Dg Chest 2 View  12/17/2015  CLINICAL DATA:  Pt c/o productive cough and headache since this morning. Vomtied x 2 earlier today. States hypertensive at work. 195/115 and 194/116 and pt states he took BP meds this morning. Hx GERD, HTN, diabetes, nonsmoker EXAM: CHEST  2 VIEW COMPARISON:  06/21/2015 FINDINGS: Lungs are mildly hyperinflated. Heart is upper limits normal. There are no focal consolidations or pleural effusions. No pulmonary edema. Thoracic spondylosis present. IMPRESSION: 1. Hyperinflation. 2.  No focal acute pulmonary abnormality. Electronically Signed   By: Norva Pavlov M.D.   On: 12/17/2015 19:49   Ct Head Wo Contrast  12/17/2015  CLINICAL DATA:  Headache since this morning.  Vomiting x2. Initial encounter. EXAM: CT HEAD WITHOUT CONTRAST TECHNIQUE: Contiguous axial images were obtained from the base of the skull through the vertex without intravenous contrast. COMPARISON:  Head CT scan 11/01/2006. FINDINGS: The brain appears normal without hemorrhage, infarct, mass lesion, mass effect, midline shift or abnormal extra-axial fluid collection. No hydrocephalus or pneumocephalus. The calvarium is intact. Imaged paranasal sinuses and mastoid air cells are clear. IMPRESSION: Negative head CT. Electronically Signed   By: Drusilla Kanner M.D.   On: 12/17/2015 19:49   I have personally reviewed and evaluated these images and lab results as part of my medical decision-making.   EKG Interpretation   Date/Time:  Monday December 17 2015 19:55:41 EST Ventricular Rate:  74 PR Interval:  159 QRS Duration: 102 QT Interval:  390 QTC Calculation: 433 R Axis:   -20 Text Interpretation:  Sinus rhythm Probable left atrial enlargement  Borderline left axis deviation Borderline repolarization abnormality  Nonspecific ST abnormality new T wave inversion v6 Confirmed by Manus Gunning   MD, Severo [phone]) on 12/17/2015 8:04:16 PM      MDM   Final diagnoses:  Hypertensive emergency  Elevated troponin  EKG abnormality   Hypertension with headache. No focal weakness, numbness or tingling. Patient also with vomiting and diarrhea and states this is similar to when he was diagnosed with pneumonia. he states he does has a cough.   Neurological exam is nonfocal.  CT head is negative for hemorrhage or other acute pathology. Patient initially stated headache was sudden onset but then retracted that statement saying it was more gradual.  Discussed that sudden onset headache is concerning for [facility].  Even with negative CT that is only 96% sensitive.  Recommended LP.  Patient declines.  He understands there could be a 3-4% chance of missing an SAH.  He appears to have capacity to make medical  decisions.  Troponin 0.11 with new T wave inversion in v6. Last stress in 2014.  Brief episode of vague chest pain earlier today in setting of coughing and vomiting.  Home BP meds given.  BP still elevated. Concern for hypertensive emergency with positive troponin and EKG changes.  IV hydralazine given.  Admission d/w Dr. Onalee Hua.  Patient continues to refuse LP.   Glynn Octave, MD 12/17/15 2350

## 2015-12-18 ENCOUNTER — Encounter (HOSPITAL_COMMUNITY): Payer: Self-pay | Admitting: Adult Health

## 2015-12-18 ENCOUNTER — Observation Stay (HOSPITAL_BASED_OUTPATIENT_CLINIC_OR_DEPARTMENT_OTHER): Payer: BC Managed Care – PPO

## 2015-12-18 DIAGNOSIS — I160 Hypertensive urgency: Secondary | ICD-10-CM

## 2015-12-18 DIAGNOSIS — Z9119 Patient's noncompliance with other medical treatment and regimen: Secondary | ICD-10-CM | POA: Diagnosis not present

## 2015-12-18 DIAGNOSIS — M79601 Pain in right arm: Secondary | ICD-10-CM | POA: Diagnosis not present

## 2015-12-18 DIAGNOSIS — R7989 Other specified abnormal findings of blood chemistry: Secondary | ICD-10-CM | POA: Diagnosis not present

## 2015-12-18 DIAGNOSIS — G4733 Obstructive sleep apnea (adult) (pediatric): Secondary | ICD-10-CM

## 2015-12-18 LAB — GLUCOSE, CAPILLARY
GLUCOSE-CAPILLARY: 244 mg/dL — AB (ref 65–99)
GLUCOSE-CAPILLARY: 300 mg/dL — AB (ref 65–99)
Glucose-Capillary: 242 mg/dL — ABNORMAL HIGH (ref 65–99)
Glucose-Capillary: 337 mg/dL — ABNORMAL HIGH (ref 65–99)

## 2015-12-18 LAB — TROPONIN I
TROPONIN I: 0.1 ng/mL — AB (ref <0.031)
Troponin I: 0.09 ng/mL — ABNORMAL HIGH (ref <0.031)
Troponin I: 0.1 ng/mL — ABNORMAL HIGH (ref <0.031)
Troponin I: 0.1 ng/mL — ABNORMAL HIGH (ref <0.031)

## 2015-12-18 LAB — MAGNESIUM: Magnesium: 1.9 mg/dL (ref 1.7–2.4)

## 2015-12-18 MED ORDER — POTASSIUM CHLORIDE CRYS ER 20 MEQ PO TBCR
40.0000 meq | EXTENDED_RELEASE_TABLET | Freq: Once | ORAL | Status: AC
  Administered 2015-12-18: 40 meq via ORAL
  Filled 2015-12-18: qty 2

## 2015-12-18 MED ORDER — POTASSIUM CHLORIDE CRYS ER 20 MEQ PO TBCR
20.0000 meq | EXTENDED_RELEASE_TABLET | Freq: Every day | ORAL | Status: DC
  Administered 2015-12-19: 20 meq via ORAL
  Filled 2015-12-18: qty 1

## 2015-12-18 MED ORDER — METFORMIN HCL 500 MG PO TABS
500.0000 mg | ORAL_TABLET | Freq: Two times a day (BID) | ORAL | Status: DC
  Administered 2015-12-18 – 2015-12-19 (×2): 500 mg via ORAL
  Filled 2015-12-18 (×2): qty 1

## 2015-12-18 MED ORDER — LISINOPRIL 10 MG PO TABS
20.0000 mg | ORAL_TABLET | Freq: Every day | ORAL | Status: DC
  Administered 2015-12-18 – 2015-12-19 (×2): 20 mg via ORAL
  Filled 2015-12-18 (×2): qty 2

## 2015-12-18 MED ORDER — HYDROCHLOROTHIAZIDE 25 MG PO TABS
25.0000 mg | ORAL_TABLET | Freq: Every day | ORAL | Status: DC
  Administered 2015-12-18: 25 mg via ORAL
  Filled 2015-12-18: qty 1

## 2015-12-18 MED ORDER — HYDROCHLOROTHIAZIDE 12.5 MG PO CAPS
12.5000 mg | ORAL_CAPSULE | Freq: Every day | ORAL | Status: DC
  Administered 2015-12-19: 12.5 mg via ORAL
  Filled 2015-12-18: qty 1

## 2015-12-18 NOTE — Care Management Note (Signed)
Case Management Note  Patient Details  Name: Geoffrey Jackson MRN: [id number] Date of Birth: June 09, 1966  Subjective/Objective:                  Pt admitted with hypertensive urgency. Pt is from home and ind with ALD's. Pt plans to return home with self care at DC.   Action/Plan: No CM needs anticipated.   Expected Discharge Date:  12/20/15               Expected Discharge Plan:  Home/Self Care  In-House Referral:  NA  Discharge planning Services  CM Consult  Post Acute Care Choice:  NA Choice offered to:  NA  DME Arranged:    DME Agency:     HH Arranged:    HH Agency:     Status of Service:  Completed, signed off  Medicare Important Message Given:    Date Medicare IM Given:    Medicare IM give by:    Date Additional Medicare IM Given:    Additional Medicare Important Message give by:     If discussed at Long Length of Stay Meetings, dates discussed:    Additional Comments:  Malcolm Metro, RN 12/18/2015, 11:32 AM

## 2015-12-18 NOTE — Consult Note (Signed)
CARDIOLOGY CONSULT NOTE   Patient ID: Geoffrey Jackson MRN: [id number] DOB/AGE: 12/04/1965 50 y.o.  Admit Date: 12/17/2015 Requesting Physician: Dr. Tarry Kos, [facility] Primary Physician: Isabella Stalling, MD Consulting Cardiologist: Nona Dell MD Reason for Consultation: Hypertensive Urgency with elevated troponin   Clinical Summary Geoffrey Jackson is a 50 y.o.male with a history of hypertension, medical noncompliance, OSA, diabetes, and hyperlipidemia who was last seen by cardiology in consultation back in 2014. He states that about a month ago he stopped taking antihypertensives for two weeks because he was "dealing with a lot of sickness in his family," but restarted two weeks ago.   He began to have symptoms of headache, sweating and generalized nausea while at work yesterday - was concerned that he might have been coming down with an illness since he had been around other "sick people.". 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 She advised he call is doctor or go to ER  Since Dr. [name] was on call at the hospital he came to ER.  He denies any chest pain or dyspnea.   In ER, BP 194/106, HR 94, afebrile. Potassium 3.2, creatinine 0.95. Glucose 196. Troponin 0.11, (subsequent troponin 0.11, and 0.09).  EKG NSR with non-specific T-wave depression in the inferior leads, and T-wave flattening in V5 and V6. HR 74. CXR and head CT scan was unremarkable. He was treated with clonidine 0.2 mg, IV benadryl, hydralazine 10 mg IV, and ASA 324 mg. He was started on Bystolic, and continued on Amlodipine BP is improving.   He states that he does not add salt to his diet. He has OSA but does not wear CPAP as it is too uncomfortable for him. Also states that he walks regularly for exercise, at least a mile today, sometimes "5-10  miles." He does not report having any exertional chest pain or unusual breathlessness with this activity.  Allergies  Allergen Reactions  . Demerol [Meperidine] Itching  . Lansoprazole Other (See Comments)    unknown    Medications Scheduled Medications: . amLODipine  10 mg Oral Daily  . famotidine  20 mg Oral BID  . hydrochlorothiazide  25 mg Oral Daily  . insulin aspart  0-9 Units Subcutaneous TID WC  . insulin glargine  40 Units Subcutaneous QHS  . nebivolol  5 mg Oral Daily  . potassium chloride  40 mEq Oral Once   Followed by  . [START ON 12/19/2015] potassium chloride  20 mEq Oral Daily  . pravastatin  40 mg Oral Daily  . tamsulosin  0.4 mg Oral Daily   PRN Medications: acetaminophen, albuterol, gi cocktail, HYDROcodone-acetaminophen, morphine injection, nitroGLYCERIN, ondansetron (ZOFRAN) IV, promethazine   Past Medical History  Diagnosis Date  . Essential hypertension   . Hypertension   . Low back pain   . Benign prostatic hypertrophy   . Type 2 diabetes mellitus (HCC)   . GERD (gastroesophageal reflux disease)   . Obesity   . Noncompliance with medications   . OSA (obstructive sleep apnea)     Non-compliant with CPAP    Past Surgical History  Procedure Laterality Date  . Spine surgery      Dr Danielle Dess   . Shoulder arthroscopy    . Shoulder arthroscopy  05/21/2012    Procedure: ARTHROSCOPY SHOULDER;  Surgeon: [name], MD;  Location: [facility];  Service: Orthopedics;  Laterality: Right;  . Subacromial decompression  05/21/2012    Procedure: SUBACROMIAL DECOMPRESSION;  Surgeon: [name], MD;  Location: [facility];  Service: Orthopedics;  Laterality: Right;  . Bicept tenodesis Right 11/10/2014    Procedure: BICEPS TENODESIS;  Surgeon: [name], MD;  Location: [facility];  Service: Orthopedics;  Laterality: Right;  biceps tendon    Family History  Problem Relation Age of Onset  . Diabetes Father   . Hypertension Father   . Heart failure  Father   . Diabetes Sister   . Hypertension Sister   . Kidney disease Father   . Hypertension Mother     Social History [name] reports that he has never smoked. He does not have any smokeless tobacco history on file. [name] reports that he drinks alcohol.  Review of Systems Complete review of systems are found to be negative except he reports having a bout with pneumonia in the last few months.  Physical Examination Blood pressure 159/96, pulse 90, temperature 97.5 F (36.4 C), temperature source Oral, resp. rate 18, height  (1.854 m), weight 256 lb 4.8 oz (116.257 kg), SpO2 98 %.  Intake/Output Summary (Last 24 hours) at 12/18/15 1044 Last data filed at 12/18/15 0842  Gross per 24 hour  Intake      0 ml  Output   1500 ml  Net  -1500 ml    Telemetry: SR rates in the 70's to 90s   GEN: Obese male, appears comfortable at rest.  HEENT: Conjunctiva and lids normal, oropharynx clear. Neck: Supple, no elevated JVP or carotid bruits, no thyromegaly. Lungs: Clear to auscultation, nonlabored breathing at rest. Cardiac: Regular rate and rhythm, no S3 or significant systolic murmur, no pericardial rub. Abdomen: Soft, nontender, obese, bowel sounds present, no guarding or rebound. Extremities: No pitting edema, distal pulses 2+. Skin: Warm and dry. Musculoskeletal: No kyphosis. Neuropsychiatric: Alert and oriented x3, affect grossly appropriate.  Prior Cardiac Testing/Procedures 1. NM Stress Test 04/14/2013 IMPRESSION: Borderline stress nuclear myocardial study revealing very good exercise tolerance without symptoms or electrocardiographic changes to suggest myocardial ischemia, mild left ventricular dilatation, borderline left ventricular dysfunction and normal myocardial perfusion except for borderline basilar anteroseptal hypoperfusion with a degree of reversibility. This likely reflects technical issues as described, but a small degree of anterior ischemia  cannot be unequivocally excluded. Mildly hypertensive response limited to recovery. Other findings as noted.  Echocardiogram 06/15/2015 Left ventricle: The cavity size was normal. Wall thickness was increased in a pattern of moderate LVH. Systolic function was normal. The estimated ejection fraction was in the range of 55% to 60%. Wall motion was normal; there were no regional wall motion abnormalities. Left ventricular diastolic function parameters were normal. - Left atrium: The atrium was mildly dilated. - Atrial septum: No defect or patent foramen ovale was identified.  Lab Results  Basic Metabolic Panel:  Recent Labs Lab 12/17/15 1955  NA 142  K 3.2*  CL 109  CO2 25  GLUCOSE 196*  BUN 14  CREATININE 0.95  CALCIUM 8.8*    CBC:  Recent Labs Lab 12/17/15 1955  WBC 4.4  NEUTROABS 1.6*  HGB 14.8  HCT 43.7  MCV 89.0  PLT 192    Cardiac Enzymes:  Recent Labs Lab 12/17/15 1955 12/17/15 2308 12/18/15 0421  TROPONINI 0.11* 0.11* 0.09*    Radiology: Dg Chest 2 View  12/17/2015  CLINICAL DATA:  Pt  c/o productive cough and headache since this morning. Vomtied x 2 earlier today. States hypertensive at work. 195/115 and 194/116 and pt states he took BP meds this morning. Hx GERD, HTN, diabetes, nonsmoker EXAM: CHEST  2 VIEW COMPARISON:  06/21/2015 FINDINGS: Lungs are mildly hyperinflated. Heart is upper limits normal. There are no focal consolidations or pleural effusions. No pulmonary edema. Thoracic spondylosis present. IMPRESSION: 1. Hyperinflation. 2.  No focal acute pulmonary abnormality. Electronically Signed   By: Norva Pavlov M.D.   On: 12/17/2015 19:49   Ct Head Wo Contrast  12/17/2015  CLINICAL DATA:  Headache since this morning. Vomiting x2. Initial encounter. EXAM: CT HEAD WITHOUT CONTRAST TECHNIQUE: Contiguous axial images were obtained from the base of the skull through the vertex without intravenous contrast. COMPARISON:  Head CT scan  11/01/2006. FINDINGS: The brain appears normal without hemorrhage, infarct, mass lesion, mass effect, midline shift or abnormal extra-axial fluid collection. No hydrocephalus or pneumocephalus. The calvarium is intact. Imaged paranasal sinuses and mastoid air cells are clear. IMPRESSION: Negative head CT. Electronically Signed   By: Drusilla Kanner M.D.   On: 12/17/2015 19:49    ECG: Sinus rhythm with left atrial enlargement and nonspecific ST-T changes.  Impression and Recommendations  1. Hypertensive Emergency: Multifactorial, longstanding issues with medical non-compliance, although he states he was taking his medications for the last two weeks, OSA not using CPAP, and diet with salted foods. He responded to clonidine and is now on amlodipine as at home, with the addition of Bystolic. Would add chlorthalidone or HCTZ to his medication regimen.  Echo in 2016 demonstrated LVH, but normal LV fx.  Follow-up study pending.  2. Mildly elevated Troponin I: Relatively flat pattern and most likely due to demand ischemia in the setting of elevated blood pressure rather than true ACS. ECG is nonspecific. Multiple CVRF for CAD, with Heart Score of 5, with moderate risk for adverse cardiac event. Can consider repeat stress test as OP. He wants to go home.   3. Hyperlipidemia; Continue statin therapy.   4. Diabetes: Elevated level of glucose this am.   5. Obesity: Weight loss and calorie reduction is recommended .   Signed: Bettey Mare. Lawrence NP [facility]  12/18/2015, 10:44 AM Co-Sign MD  Attending note:  Patient seen and examined. Reviewed records and modified the above note by Ms. Lawrence NP. Mr. Knappenberger was referred to the ER due to significantly elevated blood pressure as outlined above. He states that he was having headache and some diaphoresis, was concerned that he might be "coming down with something" since he had been around other sick contacts at work. At no point did he have chest pain or  unusual breathlessness. He was seen in the ER and had a head CT that was unremarkable. With medical treatment blood pressure came down, and his regimen has been modified somewhat. He has a history of medication noncompliance, also noncompliance with CPAP for treatment of OSA, and obesity although he has been able to lose a significant amount of weight over the years by his report. He did undergo previous cardiac testing in 2014 equivocal Cardiolite result noted at that point.  On examination today he appears comfortable, denies any headache or chest pain. Most recent blood pressure 159/96, heart rate in the 70s. He is obese, lungs are clear without labored breathing, cardiac exam reveals RRR with S4 but no significant systolic murmur, no distal pitting edema. Work shows troponin I 0.11, 0.11, 0.09. Chest x-ray shows no acute findings.  Hypertensive urgency/emergency at presentation, associated with mild flat increase in troponin I suggestive of demand ischemia rather than true ACS. ECG is nonspecific. Medical therapy now includes Norvasc, HCTZ, and Bystolic. May need further dose adjustments to achieve optimal blood pressure control, although suspect that this can be done largely as an outpatient. Echocardiogram has been ordered for today. If findings are relatively stable, he can be considered for a follow-up outpatient Cardiolite study to reassess ischemic burden and determine if any additional cardiac testing is warranted.  Jonelle Sidle, M.D., F.A.C.C.

## 2015-12-18 NOTE — Progress Notes (Signed)
Patient admitted with exploratory hypertension now under better control he has insulin minute diabetes persistently elevated troponins 3 or 4 denies anginal chest pain although his diabetic KEYSHAWN Jackson EAV:[id number] DOB: 06-06-1966 DOA: 12/17/2015 PCP: Isabella Stalling, MD             Physical Exam: Blood pressure 159/96, pulse 90, temperature 97.5 F (36.4 C), temperature source Oral, resp. rate 18, height  (1.854 m), weight 256 lb 4.8 oz (116.257 kg), SpO2 98 %. lungs clear to A&P no rales wheeze rhonchi heart regular rhythm no S3 or S4 no heaves rubs abdomen soft nontender bowel sounds normoactive   Investigations:  No results found for this or any previous visit (from the past 240 hour(s)).   Basic Metabolic Panel:  Recent Labs  78/29/56 1955 12/18/15 1045  NA 142  --   K 3.2*  --   CL 109  --   CO2 25  --   GLUCOSE 196*  --   BUN 14  --   CREATININE 0.95  --   CALCIUM 8.8*  --   MG  --  1.9   Liver Function Tests: No results for input(s): AST, ALT, ALKPHOS, BILITOT, PROT, ALBUMIN in the last 72 hours.   CBC:  Recent Labs  12/17/15 1955  WBC 4.4  NEUTROABS 1.6*  HGB 14.8  HCT 43.7  MCV 89.0  PLT 192    Dg Chest 2 View  12/17/2015  CLINICAL DATA:  Pt c/o productive cough and headache since this morning. Vomtied x 2 earlier today. States hypertensive at work. 195/115 and 194/116 and pt states he took BP meds this morning. Hx GERD, HTN, diabetes, nonsmoker EXAM: CHEST  2 VIEW COMPARISON:  06/21/2015 FINDINGS: Lungs are mildly hyperinflated. Heart is upper limits normal. There are no focal consolidations or pleural effusions. No pulmonary edema. Thoracic spondylosis present. IMPRESSION: 1. Hyperinflation. 2.  No focal acute pulmonary abnormality. Electronically Signed   By: Norva Pavlov M.D.   On: 12/17/2015 19:49   Ct Head Wo Contrast  12/17/2015  CLINICAL DATA:  Headache since this morning. Vomiting x2. Initial encounter. EXAM: CT HEAD  WITHOUT CONTRAST TECHNIQUE: Contiguous axial images were obtained from the base of the skull through the vertex without intravenous contrast. COMPARISON:  Head CT scan 11/01/2006. FINDINGS: The brain appears normal without hemorrhage, infarct, mass lesion, mass effect, midline shift or abnormal extra-axial fluid collection. No hydrocephalus or pneumocephalus. The calvarium is intact. Imaged paranasal sinuses and mastoid air cells are clear. IMPRESSION: Negative head CT. Electronically Signed   By: Drusilla Kanner M.D.   On: 12/17/2015 19:49      Medications:   Impression:  Principal Problem:   Hypertensive urgency Active Problems:   Diabetes mellitus type 2 in obese [facility])   Hyperlipidemia   Essential hypertension   GERD   Chest pain   Elevated troponin I level   Nausea, vomiting, and diarrhea   Headache   Hypertensive emergency     Plan: Continue serial troponins every 6 hours. Obtain cardiology consult regarding elevated troponins monitor glycemic control with Lantus 40 units at bedtime Will monitor potassium and renal function daily at another antihypertensive agent for optimal control  Consultants: Cardiology requested   Procedures   Antibiotics:                   Code Status: Full  Family Communication:  Spoke with patient and wife  Disposition Plan   Time spent: 30 minutes  Jamala Kohen M   12/18/2015, 12:52 PM

## 2015-12-18 NOTE — Progress Notes (Signed)
Pt is requesting metformin, which he normally takes at home 2 x daily.  CBGs today have been 300 and 242.  Spoke w/Dr. Karilyn Cota and received a telephone order w/readback and acknowledgement for pt to have metformin  2 x daily.

## 2015-12-19 DIAGNOSIS — I160 Hypertensive urgency: Secondary | ICD-10-CM | POA: Diagnosis not present

## 2015-12-19 LAB — BASIC METABOLIC PANEL
Anion gap: 6 (ref 5–15)
BUN: 18 mg/dL (ref 6–20)
CALCIUM: 9.1 mg/dL (ref 8.9–10.3)
CO2: 28 mmol/L (ref 22–32)
CREATININE: 1 mg/dL (ref 0.61–1.24)
Chloride: 105 mmol/L (ref 101–111)
GFR calc non Af Amer: >60 mL/min (ref >60)
GLUCOSE: 277 mg/dL — AB (ref 65–99)
Potassium: 3.3 mmol/L — ABNORMAL LOW (ref 3.5–5.1)
Sodium: 139 mmol/L (ref 135–145)

## 2015-12-19 LAB — HEMOGLOBIN A1C
HEMOGLOBIN A1C: 12.3 % — AB (ref 4.8–5.6)
MEAN PLASMA GLUCOSE: 306 mg/dL

## 2015-12-19 LAB — GLUCOSE, CAPILLARY
GLUCOSE-CAPILLARY: 250 mg/dL — AB (ref 65–99)
Glucose-Capillary: 238 mg/dL — ABNORMAL HIGH (ref 65–99)

## 2015-12-19 LAB — TROPONIN I: TROPONIN I: 0.09 ng/mL — AB (ref <0.031)

## 2015-12-19 MED ORDER — INSULIN GLARGINE 300 UNIT/ML ~~LOC~~ SOPN: 50.0000 [IU] | PEN_INJECTOR | Freq: Every day | SUBCUTANEOUS | Status: DC

## 2015-12-19 MED ORDER — POTASSIUM CHLORIDE CRYS ER 20 MEQ PO TBCR: 20.0000 meq | EXTENDED_RELEASE_TABLET | Freq: Every day | ORAL | Status: DC

## 2015-12-19 MED ORDER — INSULIN GLARGINE 100 UNIT/ML ~~LOC~~ SOLN
50.0000 [IU] | Freq: Every day | SUBCUTANEOUS | Status: DC
  Filled 2015-12-19: qty 0.5

## 2015-12-19 NOTE — Progress Notes (Signed)
Patient ID: Geoffrey Jackson, male   DOB: 1966/06/25, 50 y.o.   MRN: [id number]    Subjective:  Denies SSCP, palpitations or Dyspnea Eating breakfast   Objective:  Filed Vitals:   12/18/15 2205 12/19/15 0105 12/19/15 0504 12/19/15 0820  BP: 181/100 155/88 154/96 156/115  Pulse: 69 70 60 71  Temp: 97.8 F (36.6 C) 98.2 F (36.8 C) 98.1 F (36.7 C)   TempSrc: Oral Oral Oral   Resp: Height:      Weight:      SpO2: 98% 97% 99% 99%    Intake/Output from previous day:  Intake/Output Summary (Last 24 hours) at 12/19/15 7829 Last data filed at 12/19/15 0505  Gross per 24 hour  Intake    240 ml  Output   2275 ml  Net  -2035 ml    Physical Exam: Affect appropriate Overweight black male  HEENT: normal Neck supple with no adenopathy JVP normal no bruits no thyromegaly Lungs clear with no wheezing and good diaphragmatic motion Heart:  S1/S2 no murmur, no rub, gallop or click PMI normal Abdomen: benighn, BS positve, no tenderness, no AAA no bruit.  No HSM or HJR Distal pulses intact with no bruits No edema Neuro non-focal Skin warm and dry No muscular weakness   Lab Results: Basic Metabolic Panel:  Recent Labs  56/21/30 1955 12/18/15 1045 12/19/15 0545  NA 142  --  139  K 3.2*  --  3.3*  CL 109  --  105  CO2 25  --  28  GLUCOSE 196*  --  277*  BUN 14  --  18  CREATININE 0.95  --  1.00  CALCIUM 8.8*  --  9.1  MG  --  1.9  --    Liver Function Tests: No results for input(s): AST, ALT, ALKPHOS, BILITOT, PROT, ALBUMIN in the last 72 hours. No results for input(s): LIPASE, AMYLASE in the last 72 hours. CBC:  Recent Labs  12/17/15 1955  WBC 4.4  NEUTROABS 1.6*  HGB 14.8  HCT 43.7  MCV 89.0  PLT 192   Cardiac Enzymes:  Recent Labs  12/18/15 1627 12/18/15 2246 12/19/15 0545  TROPONINI 0.10* 0.10* 0.09*   Hemoglobin A1C:  Recent Labs  12/17/15 1955  HGBA1C 12.3*    Imaging: Dg Chest 2 View  12/17/2015  CLINICAL DATA:  Pt c/o  productive cough and headache since this morning. Vomtied x 2 earlier today. States hypertensive at work. 195/115 and 194/116 and pt states he took BP meds this morning. Hx GERD, HTN, diabetes, nonsmoker EXAM: CHEST  2 VIEW COMPARISON:  06/21/2015 FINDINGS: Lungs are mildly hyperinflated. Heart is upper limits normal. There are no focal consolidations or pleural effusions. No pulmonary edema. Thoracic spondylosis present. IMPRESSION: 1. Hyperinflation. 2.  No focal acute pulmonary abnormality. Electronically Signed   By: Norva Pavlov M.D.   On: 12/17/2015 19:49   Ct Head Wo Contrast  12/17/2015  CLINICAL DATA:  Headache since this morning. Vomiting x2. Initial encounter. EXAM: CT HEAD WITHOUT CONTRAST TECHNIQUE: Contiguous axial images were obtained from the base of the skull through the vertex without intravenous contrast. COMPARISON:  Head CT scan 11/01/2006. FINDINGS: The brain appears normal without hemorrhage, infarct, mass lesion, mass effect, midline shift or abnormal extra-axial fluid collection. No hydrocephalus or pneumocephalus. The calvarium is intact. Imaged paranasal sinuses and mastoid air cells are clear. IMPRESSION: Negative head CT. Electronically Signed   By: Drusilla Kanner M.D.   On:  12/17/2015 19:49    Cardiac Studies:  ECG:  SR LAD LAE non specific ST changes    Telemetry:  NsR no arrhythmia   Echo: Normal EF 65%   Medications:   . amLODipine  10 mg Oral Daily  . famotidine  20 mg Oral BID  . hydrochlorothiazide  12.5 mg Oral Daily  . insulin aspart  0-9 Units Subcutaneous TID WC  . insulin glargine  40 Units Subcutaneous QHS  . lisinopril  20 mg Oral Daily  . metFORMIN  500 mg Oral BID WC  . nebivolol  5 mg Oral Daily  . potassium chloride  20 mEq Oral Daily  . pravastatin  40 mg Oral Daily  . tamsulosin  0.4 mg Oral Daily       Assessment/Plan:  HTN:  Due to non compliance Low sodium diet improved continue current meds ok to d/c home today outpatient f/u  primary care Elevated Troponin:  Not significant no acute ECG changes no chest pain Echo with normal EF and no RWMA;s Chol:  On statin need to make sure he has f/u for lab work and LFT;s by primary    [employer] 12/19/2015, 8:29 AM

## 2015-12-19 NOTE — Discharge Summary (Signed)
Physician Discharge Summary  Geoffrey Jackson ZOX:[id number] DOB: 16-Mar-1966 DOA: 12/17/2015  PCP: Isabella Stalling, MD  Admit date: 12/17/2015 Discharge date: 12/19/2015   Recommendations for Outpatient Follow-up:  Patient is advised to take all of his antihypertensive medicines as well as his [place] insulin which is now increased from 40-50 units at bedtime nightly all other medicines remain the same exception of potassium chloride 20 mEq by mouth daily which has been added to his regimen he is instructed to follow my office within 3-5 days time for assessment of glycemic control blood pressure control and constitutional symptoms of viral syndrome Discharge Diagnoses:  Principal Problem:   Hypertensive urgency Active Problems:   Diabetes mellitus type 2 in obese [facility])   Hyperlipidemia   Essential hypertension   GERD   Chest pain   Elevated troponin I level   Nausea, vomiting, and diarrhea   Headache   Hypertensive emergency   Discharge Condition: Good  Filed Weights   12/17/15 1528 12/17/15 2231  Weight: 265 lb (120.203 kg) 256 lb 4.8 oz (116.257 kg)    History of present illness:  Patient was admitted with constitutional symptoms of viral syndrome headache malaise found to be hypertensive urgency accelerated hypertension is treated in ER his troponins were drawn which were chronically elevated 0.9 he had his blood pressure control with the addition of an RBC he had his glucose control with the increase to show insulin from 40-50 units at bedtime and he was seen in consultation by cardiology 2-D echo showing no regional wall motion abnormalities and was felt that his troponin elevation was not clinically significant bilateral and it was deemed safe for him to go home he'll follow-up my office in 3-5 days time to assess all of the above  Hospital Course:  See history of present illness  Procedures:  2-D echocardiogram  Consultations:  Cardiology  Discharge  Instructions  Discharge Instructions    Discharge instructions    Complete by:  As directed      Discharge patient    Complete by:  As directed             Medication List    STOP taking these medications        cyclobenzaprine 10 MG tablet  Commonly known as:  FLEXERIL      TAKE these medications        albuterol 108 (90 Base) MCG/ACT inhaler  Commonly known as:  PROVENTIL HFA;VENTOLIN HFA  Inhale 2 puffs into the lungs every 4 (four) hours as needed for wheezing or shortness of breath.     amLODipine 10 MG tablet  Commonly known as:  NORVASC  Take 10 mg by mouth daily.     BENICAR 40 MG tablet  Generic drug:  olmesartan  Take 40 mg by mouth daily.     BYSTOLIC 5 MG tablet  Generic drug:  nebivolol  Take 1 tablet by mouth daily.     cloNIDine 0.2 MG tablet  Commonly known as:  CATAPRES  Take 1 tablet by mouth 2 (two) times daily.     famotidine 20 MG tablet  Commonly known as:  PEPCID  Take 1 tablet (20 mg total) by mouth 2 (two) times daily.     GLUCOPHAGE 500 MG tablet  Generic drug:  metFORMIN  Take 500 mg by mouth 2 (two) times daily with a meal.     HYDROcodone-acetaminophen 7.5-325 MG tablet  Commonly known as:  NORCO  Take 1 tablet  by mouth 4 (four) times daily as needed for moderate pain.     ibuprofen 800 MG tablet  Commonly known as:  ADVIL,MOTRIN  Take 1 tablet (800 mg total) by mouth every 8 (eight) hours as needed.     Insulin Glargine 300 UNIT/ML Sopn  Commonly known as:  TOUJEO SOLOSTAR  Inject 50 Units into the skin at bedtime.     nitroGLYCERIN 0.4 MG SL tablet  Commonly known as:  NITROSTAT  Place 1 tablet (0.4 mg total) under the tongue every 5 (five) minutes as needed for chest pain.     potassium chloride SA 20 MEQ tablet  Commonly known as:  K-DUR,KLOR-CON  Take 1 tablet (20 mEq total) by mouth daily.     pravastatin 40 MG tablet  Commonly known as:  PRAVACHOL  Take 40 mg by mouth daily.     tamsulosin 0.4 MG Caps capsule   Commonly known as:  FLOMAX  Take 1 capsule by mouth daily.       Allergies  Allergen Reactions  . Demerol [Meperidine] Itching  . Lansoprazole Other (See Comments)    unknown      The results of significant diagnostics from this hospitalization (including imaging, microbiology, ancillary and laboratory) are listed below for reference.    Significant Diagnostic Studies: Dg Chest 2 View  12/17/2015  CLINICAL DATA:  Pt c/o productive cough and headache since this morning. Vomtied x 2 earlier today. States hypertensive at work. 195/115 and 194/116 and pt states he took BP meds this morning. Hx GERD, HTN, diabetes, nonsmoker EXAM: CHEST  2 VIEW COMPARISON:  06/21/2015 FINDINGS: Lungs are mildly hyperinflated. Heart is upper limits normal. There are no focal consolidations or pleural effusions. No pulmonary edema. Thoracic spondylosis present. IMPRESSION: 1. Hyperinflation. 2.  No focal acute pulmonary abnormality. Electronically Signed   By: Norva Pavlov M.D.   On: 12/17/2015 19:49   Ct Head Wo Contrast  12/17/2015  CLINICAL DATA:  Headache since this morning. Vomiting x2. Initial encounter. EXAM: CT HEAD WITHOUT CONTRAST TECHNIQUE: Contiguous axial images were obtained from the base of the skull through the vertex without intravenous contrast. COMPARISON:  Head CT scan 11/01/2006. FINDINGS: The brain appears normal without hemorrhage, infarct, mass lesion, mass effect, midline shift or abnormal extra-axial fluid collection. No hydrocephalus or pneumocephalus. The calvarium is intact. Imaged paranasal sinuses and mastoid air cells are clear. IMPRESSION: Negative head CT. Electronically Signed   By: Drusilla Kanner M.D.   On: 12/17/2015 19:49    Microbiology: No results found for this or any previous visit (from the past 240 hour(s)).   Labs: Basic Metabolic Panel:  Recent Labs Lab 12/17/15 1955 12/18/15 1045 12/19/15 0545  NA 142  --  139  K 3.2*  --  3.3*  CL 109  --  105    CO2 25  --  28  GLUCOSE 196*  --  277*  BUN 14  --  18  CREATININE 0.95  --  1.00  CALCIUM 8.8*  --  9.1  MG  --  1.9  --    Liver Function Tests: No results for input(s): AST, ALT, ALKPHOS, BILITOT, PROT, ALBUMIN in the last 168 hours. No results for input(s): LIPASE, AMYLASE in the last 168 hours. No results for input(s): AMMONIA in the last 168 hours. CBC:  Recent Labs Lab 12/17/15 1955  WBC 4.4  NEUTROABS 1.6*  HGB 14.8  HCT 43.7  MCV 89.0  PLT 192   Cardiac Enzymes:  Recent Labs Lab 12/18/15 0421 12/18/15 1043 12/18/15 1627 12/18/15 2246 12/19/15 0545  TROPONINI 0.09* 0.10* 0.10* 0.10* 0.09*   BNP: BNP (last 3 results)  Recent Labs  06/15/15 0710  BNP 158.0*    ProBNP (last 3 results) No results for input(s): PROBNP in the last 8760 hours.  CBG:  Recent Labs Lab 12/18/15 1208 12/18/15 1622 12/18/15 2042 12/19/15 0712 12/19/15 1137  GLUCAP 300* 242* 337* 238* 250*       Signed:  Yosselyn Tax M  [facility] Pager: [phone] 12/19/2015, 12:06 PM

## 2015-12-19 NOTE — Progress Notes (Addendum)
Inpatient Diabetes Program Recommendations  AACE/ADA: New Consensus Statement on Inpatient Glycemic Control (2015)  Target Ranges:  Prepandial:   less than 140 mg/dL      Peak postprandial:   less than 180 mg/dL (1-2 hours)      Critically ill patients:  140 - 180 mg/dL  Results for Geoffrey Jackson, Geoffrey Jackson (MRN [id number]) as of 12/19/2015 07:49  Ref. Range 12/18/2015 07:47 12/18/2015 12:08 12/18/2015 16:22 12/18/2015 20:42 12/19/2015 07:12  Glucose-Capillary Latest Ref Range: 65-99 mg/dL 409 (H) 811 (H) 914 (H) 337 (H) 238 (H)  Results for Geoffrey Jackson, Geoffrey Jackson (MRN [id number]) as of 12/19/2015 07:49  Ref. Range 12/17/2015 19:55  Hemoglobin A1C Latest Ref Range: 4.8-5.6 % 12.3 (H)   Review of Glycemic Control  Diabetes history: DM2 Outpatient Diabetes medications: Toujeo 40 units QHS, Metformin 500 mg BID Current orders for Inpatient glycemic control: Lantus 40 units QHS, Novolog 0-9 units TID with meals, Metformin 500 mg BID  Inpatient Diabetes Program Recommendations: Insulin - Basal: Fasting glucose 238 mg/dl this morning. Please consider increasing Lantus to 45 units QHS. Correction (SSI): Please increase correction to MODERATE scale and ADD Bedtime correction scale. Insulin - Meal Coverage: Please consider ordering Novolog 4 units TID with meals for meal coverage (in addition to Novolog correction scale). HgbA1C: A1C 12.3% on 12/17/15 indicating an average glucose of 306 mg/dl. Please consider referring patient for Endocrinology consult as an outpatient for assistance with improving glycemic control.   12/19/15@10 :52-Spoke with patient over the phone about diabetes and home regimen for diabetes control. Patient reports that he is followed by his PCP for diabetes management and currently he takes Toujeo 40 units QHS and Metformin 500 mg BID as an outpatient for diabetes control. Patient reports that he has been on Toujeo for a few months. Inquired about glycemic control as an outpatient and patient  reports he checks his glucose at least once a day and it is usually in the 100's mg/dl. Inquired about prior A1C and patient reports that his last A1C was 9%. Discussed A1C results (12.3% on 12/17/15) and reviewed what an A1C is, basic pathophysiology of DM Type 2, basic home care, importance of checking CBGs and maintaining good CBG control to prevent long-term and short-term complications. Discussed impact of nutrition, exercise, stress, sickness, and medications on diabetes control.  Patient reports he has not received any type of steroids since June or July of this past year. Patient states that he walk 3-5 miles per day.  Patient states that he has a lot of stress in his life and contributes the stress to elevated glucose noted as an inpatient. Encouraged patient to check his glucose 3-4 times per day and to keep a log of glucose readings which he needs to be taking with him to this follow up visits with PCP. Explained importance of improving glycemic control and discussed how hyperglycemia leads to damage within blood vessels which leads to common complications seen with uncontrolled diabetes. Patient states that he is not followed by an endocrinologist. Encouraged patient to talk with his PCP about the possibility of being referred to a local endocrinologist to get help with improving diabetes control.  Patient verbalized understanding of information discussed and he states that he has no further questions at this time related to diabetes.   Thanks, [name], RN, MSN, CDE Diabetes Coordinator Inpatient Diabetes Program [phone] (Team Pager from 8am to 5pm) [phone] ([facility] office) [phone] [facility] office) [phone] [facility] office)

## 2016-01-04 ENCOUNTER — Other Ambulatory Visit: Payer: Self-pay | Admitting: *Deleted

## 2016-01-04 ENCOUNTER — Telehealth: Payer: Self-pay | Admitting: Orthopedic Surgery

## 2016-01-04 DIAGNOSIS — M25511 Pain in right shoulder: Secondary | ICD-10-CM

## 2016-01-04 MED ORDER — HYDROCODONE-ACETAMINOPHEN 5-325 MG PO TABS: 1.0000 | ORAL_TABLET | Freq: Four times a day (QID) | ORAL | Status: DC | PRN

## 2016-01-04 NOTE — Telephone Encounter (Signed)
Patient called to request refill on medication:  (had received letter with January, 2017 refill  HYDROcodone-acetaminophen (NORCO) 7.5-325 MG tablet [[id number]]

## 2016-01-07 NOTE — Telephone Encounter (Signed)
Prescription available, patient aware  

## 2016-01-22 ENCOUNTER — Telehealth: Payer: Self-pay | Admitting: Orthopedic Surgery

## 2016-01-22 NOTE — Telephone Encounter (Signed)
Routing to Dr Harrison 

## 2016-01-22 NOTE — Telephone Encounter (Signed)
Patient called to relay that the recent medication refill (01/04/16):HYDROcodone-acetaminophen (NORCO/VICODIN) 5-325 MG tablet [[id number]] is not helping his pain.  He was made aware in January at time of refill of the decreased dosage on his next prescriptions, per letter issued.  I have reviewed the documentation and although patient voices understanding, he is still stating that this medication is not working well.  He has also asked to be scheduled for a new problem, left hand pain. Please advise.  [phone]

## 2016-01-22 NOTE — Telephone Encounter (Signed)
Picked up 01/04/16 as noted.

## 2016-02-04 ENCOUNTER — Telehealth: Payer: Self-pay | Admitting: Orthopedic Surgery

## 2016-02-04 NOTE — Telephone Encounter (Signed)
Patient called to request refill on medication - on "wean" - last received HYDROcodone-acetaminophen (NORCO/VICODIN) 5-325 MG tablet [[id number]][[id number]] - please advise; patient [phone]#[phone]

## 2016-02-05 ENCOUNTER — Other Ambulatory Visit: Payer: Self-pay | Admitting: *Deleted

## 2016-02-05 MED ORDER — HYDROCODONE-ACETAMINOPHEN 5-325 MG PO TABS: 1.0000 | ORAL_TABLET | Freq: Four times a day (QID) | ORAL | Status: DC | PRN

## 2016-02-20 ENCOUNTER — Ambulatory Visit (INDEPENDENT_AMBULATORY_CARE_PROVIDER_SITE_OTHER): Payer: BC Managed Care – PPO

## 2016-02-20 ENCOUNTER — Ambulatory Visit (INDEPENDENT_AMBULATORY_CARE_PROVIDER_SITE_OTHER): Payer: BC Managed Care – PPO | Admitting: Orthopedic Surgery

## 2016-02-20 VITALS — Wt 263.0 lb

## 2016-02-20 DIAGNOSIS — M65321 Trigger finger, right index finger: Secondary | ICD-10-CM

## 2016-02-20 DIAGNOSIS — M65332 Trigger finger, left middle finger: Secondary | ICD-10-CM | POA: Diagnosis not present

## 2016-02-20 DIAGNOSIS — M65342 Trigger finger, left ring finger: Secondary | ICD-10-CM

## 2016-02-20 DIAGNOSIS — M79641 Pain in right hand: Secondary | ICD-10-CM | POA: Diagnosis not present

## 2016-02-20 NOTE — Patient Instructions (Signed)
Trigger Finger °Trigger finger (digital tendinitis and stenosing tenosynovitis) is a common disorder that causes an often painful catching of the fingers or thumb. It occurs as a clicking, snapping, or locking of a finger in the palm of the hand. This is caused by a problem with the tendons that flex or bend the fingers sliding smoothly through their sheaths. The condition may occur in any finger or a couple fingers at the same time.  °The finger may lock with the finger curled or suddenly straighten out with a snap. This is more common in patients with rheumatoid arthritis and diabetes. Left untreated, the condition may get worse to the point where the finger becomes locked in flexion, like making a fist, or less commonly locked with the finger straightened out. °CAUSES  °· Inflammation and scarring that lead to swelling around the tendon sheath. °· Repeated or forceful movements. °· Rheumatoid arthritis, an autoimmune disease that affects joints. °· Gout. °· Diabetes mellitus. °SIGNS AND SYMPTOMS °· Soreness and swelling of your finger. °· A painful clicking or snapping as you bend and straighten your finger. °DIAGNOSIS  °Your health care provider will do a physical exam of your finger to diagnose trigger finger. °TREATMENT  °· Splinting for 6-8 weeks may be helpful. °· Nonsteroidal anti-inflammatory medicines (NSAIDs) can help to relieve the pain and inflammation. °· Cortisone injections, along with splinting, may speed up recovery. Several injections may be required. Cortisone may give relief after one injection. °· Surgery is another treatment that may be used if conservative treatments do not work. Surgery can be minor, without incisions (a cut does not have to be made), and can be done with a needle through the skin. °· Other surgical choices involve an open procedure in which the surgeon opens the hand through a small incision and cuts the pulley so the tendon can again slide smoothly. Your hand will still  work fine. °HOME CARE INSTRUCTIONS °· Apply ice to the injured area, twice per day: °¨ Put ice in a plastic bag. °¨ Place a towel between your skin and the bag. °¨ Leave the ice on for 20 minutes, 3-4 times a day. °· Rest your hand often. °MAKE SURE YOU:  °· Understand these instructions. °· Will watch your condition. °· Will get help right away if you are not doing well or get worse. °  °This information is not intended to replace advice given to you by your health care provider. Make sure you discuss any questions you have with your health care provider. °  °Document Released: 08/30/2004 Document Revised: 07/13/2013 Document Reviewed: 04/12/2013 °[employer]vier Interactive Patient Education ©2016 [employer] ° °

## 2016-02-20 NOTE — Progress Notes (Signed)
New problem  Chief complaint is locking and catching both hands right index finger left long and left ring finger.  HPI He complains of pain in the right index finger status post trauma to the right index finger when he hit it up against a blunt object. Pain is described as dull moderately severe of several months duration and constant improved with nothing to date and worse with movement the left to digits locking has to unlock.  Review of Systems  Constitutional: Negative for fever, chills and malaise/fatigue.  Musculoskeletal: Positive for myalgias and joint pain.  Neurological: Negative for tingling, sensory change and focal weakness.    Past Medical History  Diagnosis Date  . Essential hypertension   . Hypertension   . Low back pain   . Benign prostatic hypertrophy   . Type 2 diabetes mellitus (HCC)   . GERD (gastroesophageal reflux disease)   . Obesity   . Noncompliance with medications   . OSA (obstructive sleep apnea)     Non-compliant with CPAP    Past Surgical History  Procedure Laterality Date  . Spine surgery      Dr Danielle Dess   . Shoulder arthroscopy    . Shoulder arthroscopy  05/21/2012    Procedure: ARTHROSCOPY SHOULDER;  Surgeon: [name], MD;  Location: [facility];  Service: Orthopedics;  Laterality: Right;  . Subacromial decompression  05/21/2012    Procedure: SUBACROMIAL DECOMPRESSION;  Surgeon: [name], MD;  Location: [facility];  Service: Orthopedics;  Laterality: Right;  . Bicept tenodesis Right 11/10/2014    Procedure: BICEPS TENODESIS;  Surgeon: [name], MD;  Location: [facility];  Service: Orthopedics;  Laterality: Right;  biceps tendon   Family History  Problem Relation Age of Onset  . Diabetes Father   . Hypertension Father   . Heart failure Father   . Diabetes Sister   . Hypertension Sister   . Kidney disease Father   . Hypertension Mother    Social History  Substance Use Topics  . Smoking status: Never Smoker   . Smokeless  tobacco: Not on file  . Alcohol Use: 0.0 oz/week    0 Standard drinks or equivalent per week     Comment: Occasionally    Current outpatient prescriptions:  .  albuterol (PROVENTIL HFA;VENTOLIN HFA) 108 (90 BASE) MCG/ACT inhaler, Inhale 2 puffs into the lungs every 4 (four) hours as needed for wheezing or shortness of breath., Disp: 1 Inhaler, Rfl: 0 .  amLODipine (NORVASC) 10 MG tablet, Take 10 mg by mouth daily., Disp: , Rfl:  .  BENICAR 40 MG tablet, Take 40 mg by mouth daily. , Disp: , Rfl:  .  BYSTOLIC 5 MG tablet, Take 1 tablet by mouth daily., Disp: , Rfl:  .  cloNIDine (CATAPRES) 0.2 MG tablet, Take 1 tablet by mouth 2 (two) times daily., Disp: , Rfl:  .  famotidine (PEPCID) 20 MG tablet, Take 1 tablet (20 mg total) by mouth 2 (two) times daily., Disp: 30 tablet, Rfl: 0 .  HYDROcodone-acetaminophen (NORCO) 7.5-325 MG tablet, Take 1 tablet by mouth 4 (four) times daily as needed for moderate pain. , Disp: , Rfl:  .  HYDROcodone-acetaminophen (NORCO/VICODIN) 5-325 MG tablet, Take 1 tablet by mouth every 6 (six) hours as needed for moderate pain. Must last 30 days last of 3 wean scripts, Disp: 120 tablet, Rfl: 0 .  ibuprofen (ADVIL,MOTRIN) 800 MG tablet, Take 1 tablet (800 mg total) by mouth every 8 (eight) hours as needed.,  Disp: 90 tablet, Rfl: 0 .  Insulin Glargine (TOUJEO SOLOSTAR) 300 UNIT/ML SOPN, Inject 50 Units into the skin at bedtime., Disp: 300 mL, Rfl: 11 .  metFORMIN (GLUCOPHAGE) 500 MG tablet, Take 500 mg by mouth 2 (two) times daily with a meal. , Disp: , Rfl:  .  nitroGLYCERIN (NITROSTAT) 0.4 MG SL tablet, Place 1 tablet (0.4 mg total) under the tongue every 5 (five) minutes as needed for chest pain., Disp: 30 tablet, Rfl: 12 .  potassium chloride SA (K-DUR,KLOR-CON) 20 MEQ tablet, Take 1 tablet (20 mEq total) by mouth daily., Disp: 30 tablet, Rfl: 1 .  pravastatin (PRAVACHOL) 40 MG tablet, Take 40 mg by mouth daily., Disp: , Rfl:  .  tamsulosin (FLOMAX) 0.4 MG CAPS capsule,  Take 1 capsule by mouth daily., Disp: , Rfl:   Wt 263 lb (119.296 kg)  Physical Exam  Constitutional: He is oriented to person, place, and time. He appears well-developed and well-nourished. No distress.  Cardiovascular: Normal rate and intact distal pulses.   Musculoskeletal:  Right hand. There are nodules on the DIP joints there is tenderness of the PIP joint of the index finger he doesn't have catching or locking but he has tenderness over the A1 pulley he has decreased flexion of all joints of the index finger with no instability flexor tendons intact extensor tendons intact skin normal pulse and perfusion normal sensation normal  The left hand has tenderness over the A1 pulley of the ring and long finger has nodules over the DIP joints he has normal range of motion there. All joints are stable. The flexor and extensor tendons have normal strength skin is normal pulse and perfusion is normal and sensation is normal.  Neurological: He is alert and oriented to person, place, and time.  Skin: Skin is warm and dry. No rash noted. He is not diaphoretic. No erythema. No pallor.  Psychiatric: He has a normal mood and affect. His behavior is normal. Judgment and thought content normal.    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  . Trigger index finger of right hand   . Trigger ring finger, left Yes  . Trigger middle finger of left hand   . Right hand pain      3 injections given all 3 trigger fingers  All 3 were done in the following manner starting with the right index finger  Trigger finger injection  Diagnosis  trigger finger right index finger, left long finger and left ring finger Procedure injection A1  pulley Medications lidocaine 1% 1 mL and Depo-Medrol 40 mg 1 mL Skin prep alcohol and ethyl chloride Verbal consent was obtained Timeout confirmed the injection site  After cleaning the skin with alcohol and anesthetizing the skin with ethyl chloride the A1 pulley was palpated and the injection was performed without complication

## 2016-03-10 ENCOUNTER — Ambulatory Visit: Payer: BC Managed Care – PPO | Admitting: Orthopedic Surgery

## 2016-03-10 ENCOUNTER — Encounter: Payer: Self-pay | Admitting: Orthopedic Surgery

## 2016-03-13 ENCOUNTER — Telehealth: Payer: Self-pay

## 2016-03-13 ENCOUNTER — Telehealth: Payer: Self-pay | Admitting: Orthopedic Surgery

## 2016-03-13 NOTE — Telephone Encounter (Signed)
Hydrocodone-Acetaminophen 7.5/325mg   Qty 120 tablets  Take 1 by mouth every 6(six) hours as needed for moderate pain

## 2016-03-14 NOTE — Telephone Encounter (Signed)
Duplicate message. 

## 2016-03-14 NOTE — Telephone Encounter (Signed)
MUST COME IN FOR APPT TO GET SCRIPT

## 2016-03-14 NOTE — Telephone Encounter (Signed)
Last prescription stated last of 3 wean scripts, also no showed for appointment this week  Do you want to refill this medication?

## 2016-03-17 NOTE — Telephone Encounter (Signed)
Please advise and schedule appt

## 2016-03-17 NOTE — Telephone Encounter (Signed)
Patient is re-scheduled for appointment 03/26/16.  Called back to relay per Dr Mort SawyersHarrison's response.

## 2016-03-26 ENCOUNTER — Encounter: Payer: Self-pay | Admitting: Orthopedic Surgery

## 2016-03-26 ENCOUNTER — Ambulatory Visit (INDEPENDENT_AMBULATORY_CARE_PROVIDER_SITE_OTHER): Payer: BC Managed Care – PPO | Admitting: Orthopedic Surgery

## 2016-03-26 VITALS — Ht 73.0 in

## 2016-03-26 DIAGNOSIS — M25511 Pain in right shoulder: Secondary | ICD-10-CM | POA: Diagnosis not present

## 2016-03-26 DIAGNOSIS — M65321 Trigger finger, right index finger: Secondary | ICD-10-CM

## 2016-03-26 MED ORDER — HYDROCODONE-ACETAMINOPHEN 5-325 MG PO TABS: 1.0000 | ORAL_TABLET | Freq: Four times a day (QID) | ORAL | Status: DC | PRN

## 2016-03-26 MED ORDER — METHOCARBAMOL 500 MG PO TABS: 500.0000 mg | ORAL_TABLET | Freq: Four times a day (QID) | ORAL | Status: DC

## 2016-03-26 MED ORDER — INDOMETHACIN 25 MG PO CAPS: 25.0000 mg | ORAL_CAPSULE | Freq: Three times a day (TID) | ORAL | Status: DC

## 2016-03-26 NOTE — Patient Instructions (Signed)
Meds ordered this encounter  Medications  . indomethacin (INDOCIN) 25 MG capsule    Sig: Take 1 capsule (25 mg total) by mouth 3 (three) times daily with meals.    Dispense:  90 capsule    Refill:  0  . HYDROcodone-acetaminophen (NORCO) 5-325 MG tablet    Sig: Take 1 tablet by mouth every 6 (six) hours as needed for moderate pain.    Dispense:  120 tablet    Refill:  0  . methocarbamol (ROBAXIN) 500 MG tablet    Sig: Take 1 tablet (500 mg total) by mouth 4 (four) times daily.    Dispense:  60 tablet    Refill:  2

## 2016-03-26 NOTE — Progress Notes (Addendum)
Patient ID: Geoffrey FerriesStephen D Jackson, male   DOB: 11-28-1965, 50 y.o.   MRN: [id number]  Chief Complaint  Patient presents with  . Follow-up    follow up right arm numbness    HPI follow-up visit status post multiple injections of the fingers are triggering and status post multiple procedures on the right shoulder including arthroscopically right shoulder with labral injury. He also had a rotator cuff repair. Also biceps tenodesis in 3 separate procedures. I believe he also did the distal clavicle in one of those procedures  Complains of fatigue and pain over the right proximal biceps and anterior shoulder joint.  He has tried some topical muscle creams and rubs. He takes hydrocodone 5 mg for pain. Intermittently takes anti-inflammatories. He's had an injection or to there as well but still complains of weakness compared to his other side.  Review of Systems  Constitutional: Negative for fever and chills.  Musculoskeletal: Positive for myalgias.  Neurological: Negative for tingling.  Endo/Heme/Allergies: Does not bruise/bleed easily.    Ht 6\' 1"  (1.854 m)  Physical Exam  Constitutional: He is oriented to person, place, and time. He appears well-developed and well-nourished. No distress.  Cardiovascular: Normal rate and intact distal pulses.   Neurological: He is alert and oriented to person, place, and time.  Skin: Skin is warm and dry. No rash noted. He is not diaphoretic. No erythema. No pallor.  Psychiatric: He has a normal mood and affect. His behavior is normal. Judgment and thought content normal.    Right Hand Exam   Tenderness  The patient is experiencing tenderness in the palmer area.  Range of Motion  The patient has normal right wrist ROM.   Hand  MP Index: 30  MP Ring: normal  PIP Index: 50  DIP Index: normal   Muscle Strength  The patient has normal right wrist strength.  Other  Erythema: absent Sensation: normal Pulse: present   Left Hand Exam  Left hand  exam is normal.  Range of Motion  The patient has normal left wrist ROM.  Other  Sensation: normal Pulse: present   Right Shoulder Exam   Tenderness  The patient is experiencing tenderness in the biceps tendon.  Range of Motion  The patient has normal right shoulder ROM.  Muscle Strength  The patient has normal right shoulder strength.  Tests  Apprehension: negative Cross Arm: negative Drop Arm: negative Hawkin's test: negative Impingement: negative Sulcus: absent  Other  Scars: present Sensation: normal Pulse: present   Left Shoulder Exam   Other  Sensation: normal Pulse: present         ASSESSMENT AND PLAN   Trigger finger injection  Diagnosis  Rt index finger trigger finger Procedure injection A1 pulley Medications lidocaine 1% 1 mL and Depo-Medrol 40 mg 1 mL Skin prep alcohol and ethyl chloride Verbal consent was obtained Timeout confirmed the injection site  After cleaning the skin with alcohol and anesthetizing the skin with ethyl chloride the A1 pulley was palpated and the injection was performed without complication

## 2016-04-30 ENCOUNTER — Telehealth: Payer: Self-pay | Admitting: Orthopedic Surgery

## 2016-04-30 ENCOUNTER — Other Ambulatory Visit: Payer: Self-pay | Admitting: *Deleted

## 2016-04-30 DIAGNOSIS — M25511 Pain in right shoulder: Secondary | ICD-10-CM

## 2016-04-30 MED ORDER — HYDROCODONE-ACETAMINOPHEN 5-325 MG PO TABS: 1.0000 | ORAL_TABLET | Freq: Four times a day (QID) | ORAL | Status: DC | PRN

## 2016-04-30 NOTE — Telephone Encounter (Signed)
Hydrocodone-Acetaminophen 5/325mg  Qty 120 Tablets  ° °Take 1 tablet by mouth every 6(six) hours as needed for moderate pain. ° ° ° °

## 2016-04-30 NOTE — Telephone Encounter (Signed)
Prescription available 

## 2016-05-05 ENCOUNTER — Ambulatory Visit (INDEPENDENT_AMBULATORY_CARE_PROVIDER_SITE_OTHER): Payer: BC Managed Care – PPO | Admitting: Orthopedic Surgery

## 2016-05-05 ENCOUNTER — Encounter: Payer: Self-pay | Admitting: Orthopedic Surgery

## 2016-05-05 ENCOUNTER — Ambulatory Visit: Payer: BC Managed Care – PPO | Admitting: Orthopedic Surgery

## 2016-05-05 VITALS — BP 124/85 | Ht 71.0 in | Wt 259.0 lb

## 2016-05-05 DIAGNOSIS — M65321 Trigger finger, right index finger: Secondary | ICD-10-CM

## 2016-05-05 DIAGNOSIS — Z7189 Other specified counseling: Secondary | ICD-10-CM

## 2016-05-05 DIAGNOSIS — G8929 Other chronic pain: Secondary | ICD-10-CM

## 2016-05-05 DIAGNOSIS — M25511 Pain in right shoulder: Secondary | ICD-10-CM | POA: Diagnosis not present

## 2016-05-05 DIAGNOSIS — M7521 Bicipital tendinitis, right shoulder: Secondary | ICD-10-CM

## 2016-05-05 DIAGNOSIS — M65332 Trigger finger, left middle finger: Secondary | ICD-10-CM

## 2016-05-05 NOTE — Patient Instructions (Signed)

## 2016-05-05 NOTE — Progress Notes (Signed)
Patient ID: Rosaria FerriesStephen D Wilmouth, male   DOB: [birth year], 50 y.o.   MRN: [id number]  Chief Complaint  Patient presents with  . Follow-up    Right shoulder and right index trigger finger    HPI - right index finger pain at A1 pulley s/p 2 injection still loss of full motion   Right shoulder pulling at tenodesis sight s/p tenodesis and shoulder arthroscopy and repair of RCR  Aching non radiating pain right arm with weakness in flexion activities   Review of Systems  Constitutional: Negative for fever and chills.  Neurological: Negative for tingling.    Past Medical History  Diagnosis Date  . Essential hypertension   . Hypertension   . Low back pain   . Benign prostatic hypertrophy   . Type 2 diabetes mellitus (HCC)   . GERD (gastroesophageal reflux disease)   . Obesity   . Noncompliance with medications   . OSA (obstructive sleep apnea)     Non-compliant with CPAP    BP 124/85 mmHg  Ht 5\' 11"  (1.803 m)  Wt 259 lb (117.482 kg)  BMI 36.14 kg/m2  Physical Exam Physical Exam  Constitutional: The patient appears well-developed and well-nourished. No distress.  The patient is oriented to person, place, and time.  Psychiatric: The patient has a normal mood and affect.  Cardiovascular: Intact distal pulses.   Neurological: sensation is normal  Skin: Skin is warm and dry. No rash noted. The patient is not diaphoretic. No erythema. No pallor.    Ortho Exam  Right index finger:  A1 pulley tenderness; flexion loss is 5 degrees Strength is normal  Stability is normal    Right shoulder no detectable flexion strength deficit at the elbow  No tenderness   ASSESSMENT AND PLAN   Trigger finger injection  Diagnosis  Right index finger Procedure injection A1 pulley Medications lidocaine 1% 1 mL and Depo-Medrol 40 mg 1 mL Skin prep alcohol and ethyl chloride Verbal consent was obtained Timeout confirmed the injection site  After cleaning the skin with alcohol and  anesthetizing the skin with ethyl chloride the A1 pulley was palpated and the injection was performed without complication    Fu prn   Needs random urine test before next script    Fuller CanadaStanley Rosary Filosa, MD 05/05/2016 9:23 AM

## 2016-06-03 ENCOUNTER — Telehealth: Payer: Self-pay | Admitting: Orthopaedic Surgery

## 2016-06-03 DIAGNOSIS — M25511 Pain in right shoulder: Secondary | ICD-10-CM

## 2016-06-03 MED ORDER — HYDROCODONE-ACETAMINOPHEN 5-325 MG PO TABS: 1.0000 | ORAL_TABLET | Freq: Four times a day (QID) | ORAL | Status: DC | PRN

## 2016-06-03 NOTE — Telephone Encounter (Signed)
Rx done. 

## 2016-06-03 NOTE — Telephone Encounter (Signed)
Patient of Dr. Mort SawyersHarrison's is  requesting a refill on Hydrocodone-Acetaminophen 5-325 mgs.  Qty 120  Last filled on 04-30-16 Sig: Take 1 tablet by mouth every 6 (six) hours as needed for moderate pain.

## 2016-06-06 ENCOUNTER — Other Ambulatory Visit: Payer: Self-pay | Admitting: Orthopedic Surgery

## 2016-06-10 ENCOUNTER — Other Ambulatory Visit: Payer: Self-pay | Admitting: *Deleted

## 2016-06-10 MED ORDER — IBUPROFEN 800 MG PO TABS: 800.0000 mg | ORAL_TABLET | Freq: Three times a day (TID) | ORAL | Status: DC | PRN

## 2016-07-08 ENCOUNTER — Emergency Department (HOSPITAL_COMMUNITY)
Admission: EM | Admit: 2016-07-08 | Discharge: 2016-07-08 | Disposition: A | Discharge status: Home / Self Care | Payer: BC Managed Care – PPO | Attending: Emergency Medicine | Admitting: Emergency Medicine

## 2016-07-08 ENCOUNTER — Encounter (HOSPITAL_COMMUNITY): Payer: Self-pay | Admitting: *Deleted

## 2016-07-08 DIAGNOSIS — E119 Type 2 diabetes mellitus without complications: Secondary | ICD-10-CM | POA: Diagnosis not present

## 2016-07-08 DIAGNOSIS — Z7984 Long term (current) use of oral hypoglycemic drugs: Secondary | ICD-10-CM | POA: Insufficient documentation

## 2016-07-08 DIAGNOSIS — I10 Essential (primary) hypertension: Secondary | ICD-10-CM | POA: Diagnosis not present

## 2016-07-08 DIAGNOSIS — L0201 Cutaneous abscess of face: Secondary | ICD-10-CM

## 2016-07-08 DIAGNOSIS — Z794 Long term (current) use of insulin: Secondary | ICD-10-CM | POA: Insufficient documentation

## 2016-07-08 DIAGNOSIS — Z79899 Other long term (current) drug therapy: Secondary | ICD-10-CM | POA: Insufficient documentation

## 2016-07-08 MED ORDER — SULFAMETHOXAZOLE-TRIMETHOPRIM 800-160 MG PO TABS
1.0000 | ORAL_TABLET | Freq: Once | ORAL | Status: AC
  Administered 2016-07-08: 1 via ORAL
  Filled 2016-07-08: qty 1

## 2016-07-08 MED ORDER — HYDROCODONE-ACETAMINOPHEN 5-325 MG PO TABS: 1.0000 | ORAL_TABLET | ORAL | 0 refills | Status: DC | PRN

## 2016-07-08 MED ORDER — HYDROCODONE-ACETAMINOPHEN 5-325 MG PO TABS
1.0000 | ORAL_TABLET | Freq: Once | ORAL | Status: AC
  Administered 2016-07-08: 1 via ORAL
  Filled 2016-07-08: qty 1

## 2016-07-08 MED ORDER — SULFAMETHOXAZOLE-TRIMETHOPRIM 800-160 MG PO TABS: 1.0000 | ORAL_TABLET | Freq: Two times a day (BID) | ORAL | 0 refills | Status: DC

## 2016-07-08 NOTE — ED Provider Notes (Signed)
[facility] Provider Note   CSN: [id number] Arrival date & time: 07/08/16  2122  By signing my name below, I, Geoffrey Jackson, attest that this documentation has been prepared under the direction and in the presence of [name], MD. Electronically signed, Geoffrey Jackson, ED Scribe. 07/08/16. 9:57 PM.  History   Chief Complaint Chief Complaint  Patient presents with  . Abscess    HPI HPI Comments: Geoffrey Jackson is a 50 y.o. male who presents to the Emergency Department complaining of gradually worsening pain and swelling to his right cheek that was noticed two days ago. Pt states that he felt like it started as an ingrown hair. Pt's relative reports that there has been some drainage from the area. Pt has Hx of similar ingrown hairs.   The history is provided by the patient. No language interpreter was used.  Abscess  Associated symptoms: no fever     Past Medical History:  Diagnosis Date  . Benign prostatic hypertrophy   . Essential hypertension   . GERD (gastroesophageal reflux disease)   . Hypertension   . Low back pain   . Noncompliance with medications   . Obesity   . OSA (obstructive sleep apnea)    Non-compliant with CPAP  . Type 2 diabetes mellitus [facility])     Patient Active Problem List   Diagnosis Date Noted  . Hypertensive urgency 12/17/2015  . Nausea, vomiting, and diarrhea 12/17/2015  . Headache 12/17/2015  . Hypertensive emergency 12/17/2015  . CAP (community acquired pneumonia) 06/15/2015  . Elevated troponin I level 06/15/2015  . Biceps tendonitis on right   . Tear of biceps muscle 10/25/2013  . Right shoulder pain 10/25/2013  . Chest pain 04/13/2013  . Diabetes mellitus type 2 in obese (HCC) 02/09/2007  . Hyperlipidemia 12/08/2006  . Obesity 12/08/2006  . Essential hypertension 12/08/2006  . GERD 12/08/2006  . BENIGN PROSTATIC HYPERTROPHY 12/08/2006    Past Surgical History:  Procedure Laterality Date  . BICEPT TENODESIS Right  11/10/2014   Procedure: BICEPS TENODESIS;  Surgeon: [name], MD;  Location: [facility];  Service: Orthopedics;  Laterality: Right;  biceps tendon  . SHOULDER ARTHROSCOPY    . SHOULDER ARTHROSCOPY  05/21/2012   Procedure: ARTHROSCOPY SHOULDER;  Surgeon: [name], MD;  Location: [facility];  Service: Orthopedics;  Laterality: Right;  . SPINE SURGERY     Dr Danielle Dess   . SUBACROMIAL DECOMPRESSION  05/21/2012   Procedure: SUBACROMIAL DECOMPRESSION;  Surgeon: [name], MD;  Location: [facility];  Service: Orthopedics;  Laterality: Right;       Home Medications    Prior to Admission medications   Medication Sig Start Date End Date Taking? Authorizing Provider  albuterol (PROVENTIL HFA;VENTOLIN HFA) 108 (90 BASE) MCG/ACT inhaler Inhale 2 puffs into the lungs every 4 (four) hours as needed for wheezing or shortness of breath. 06/21/15   Burgess Amor, PA-C  amLODipine (NORVASC) 10 MG tablet Take 10 mg by mouth daily. 11/15/15   Historical Provider, MD  BENICAR 40 MG tablet Take 40 mg by mouth daily.  09/16/15   Historical Provider, MD  BYSTOLIC 5 MG tablet Take 1 tablet by mouth daily. 10/09/14   Historical Provider, MD  cloNIDine (CATAPRES) 0.2 MG tablet Take 1 tablet by mouth 2 (two) times daily. 04/30/15   Historical Provider, MD  famotidine (PEPCID) 20 MG tablet Take 1 tablet (20 mg total) by mouth 2 (two) times daily. 06/21/15   Burgess Amor, PA-C  HYDROcodone-acetaminophen (  NORCO/VICODIN) 5-325 MG tablet Take 1 tablet by mouth every 4 (four) hours as needed. 07/08/16   Jacalyn LefevreJulie Angell Honse, MD  ibuprofen (ADVIL,MOTRIN) 800 MG tablet Take 1 tablet (800 mg total) by mouth every 8 (eight) hours as needed. 06/10/16   Vickki HearingStanley E Harrison, MD  indomethacin (INDOCIN) 25 MG capsule Take 1 capsule (25 mg total) by mouth 3 (three) times daily with meals. 03/26/16   Vickki HearingStanley E Harrison, MD  Insulin Glargine (TOUJEO SOLOSTAR) 300 UNIT/ML SOPN Inject 50 Units into the skin at bedtime. 12/19/15   Oval Linseyichard  Dondiego, MD  metFORMIN (GLUCOPHAGE) 500 MG tablet Take 500 mg by mouth 2 (two) times daily with a meal.     Historical Provider, MD  methocarbamol (ROBAXIN) 500 MG tablet Take 1 tablet (500 mg total) by mouth 4 (four) times daily. 03/26/16   Vickki HearingStanley E Harrison, MD  nitroGLYCERIN (NITROSTAT) 0.4 MG SL tablet Place 1 tablet (0.4 mg total) under the tongue every 5 (five) minutes as needed for chest pain. 06/19/15   [name], MD  potassium chloride SA (K-DUR,KLOR-CON) 20 MEQ tablet Take 1 tablet (20 mEq total) by mouth daily. 12/19/15   [name], MD  pravastatin (PRAVACHOL) 40 MG tablet Take 40 mg by mouth daily.    Historical Provider, MD  sulfamethoxazole-trimethoprim (BACTRIM DS,SEPTRA DS) 800-160 MG tablet Take 1 tablet by mouth 2 (two) times daily. 07/08/16 07/15/16  Jacalyn LefevreJulie Ambra Haverstick, MD  tamsulosin (FLOMAX) 0.4 MG CAPS capsule Take 1 capsule by mouth daily. 11/15/15   Historical Provider, MD    Family History Family History  Problem Relation Age of Onset  . Diabetes Father   . Hypertension Father   . Heart failure Father   . Kidney disease Father   . Diabetes Sister   . Hypertension Sister   . Hypertension Mother     Social History Social History  Substance Use Topics  . Smoking status: Never Smoker  . Smokeless tobacco: Never Used  . Alcohol use 0.0 oz/week     Comment: Occasionally    Allergies   Demerol [meperidine] and Lansoprazole   Review of Systems Review of Systems  Constitutional: Negative for fever.  Skin: Negative for wound (swelling to right cheek).  All other systems reviewed and are negative.    Physical Exam Updated Vital Signs BP (!) 188/101 (BP Location: Left Arm)   Pulse 73   Temp 99.9 F (37.7 C) (Oral)   Resp 18   Ht 6\' 1"  (1.854 m)   Wt 250 lb (113.4 kg)   SpO2 97%   BMI 32.98 kg/m   Physical Exam  Constitutional: He is oriented to person, place, and time. He appears well-developed and well-nourished. No distress.  HENT:    Head: Normocephalic and atraumatic.  Neck: Normal range of motion.  Pulmonary/Chest: Effort normal.  Neurological: He is alert and oriented to person, place, and time.  Skin: Skin is warm and dry. He is not diaphoretic.  Small abscess on the right cheek. Some surrounding cellulitis. No fluctuance  Psychiatric: He has a normal mood and affect. Judgment normal.  Nursing note and vitals reviewed.    ED Treatments / Results  DIAGNOSTIC STUDIES: Oxygen Saturation is 97% on RA, normal by my interpretation.  COORDINATION OF CARE: 9:54 PM-Will order Abx and medication for pain. Discussed treatment plan with pt at bedside and pt agreed to plan.   Labs (all labs ordered are listed, but only abnormal results are displayed) Labs Reviewed - No data to display  EKG  EKG Interpretation None       Radiology No results found.  Procedures Procedures (including critical care time)  Medications Ordered in ED Medications  sulfamethoxazole-trimethoprim (BACTRIM DS,SEPTRA DS) 800-160 MG per tablet 1 tablet (not administered)  HYDROcodone-acetaminophen (NORCO/VICODIN) 5-325 MG per tablet 1 tablet (not administered)     Initial Impression / Assessment and Plan / ED Course  I have reviewed the triage vital signs and the nursing notes.  Pertinent labs & imaging results that were available during my care of the patient were reviewed by me and considered in my medical decision making (see chart for details).  Clinical Course   I told pt I could try to I&D abscess today, but he wants to hold off and try antibiotics first.  He knows to return if sx worsen.  Final Clinical Impressions(s) / ED Diagnoses   Final diagnoses:  Facial abscess    New Prescriptions New Prescriptions   HYDROCODONE-ACETAMINOPHEN (NORCO/VICODIN) 5-325 MG TABLET    Take 1 tablet by mouth every 4 (four) hours as needed.   SULFAMETHOXAZOLE-TRIMETHOPRIM (BACTRIM DS,SEPTRA DS) 800-160 MG TABLET    Take 1 tablet by  mouth 2 (two) times daily.  I personally performed the services described in this documentation, which was scribed in my presence. The recorded information has been reviewed and is accurate.     Jacalyn LefevreJulie Darrelyn Morro, MD 07/08/16 2213

## 2016-07-08 NOTE — ED Triage Notes (Signed)
Pt c/o swelling to right side of face; pt states it started x 2 days ago and has been getting progressively worse

## 2016-07-12 ENCOUNTER — Encounter (HOSPITAL_COMMUNITY): Payer: Self-pay

## 2016-07-12 ENCOUNTER — Emergency Department (HOSPITAL_COMMUNITY)
Admission: EM | Admit: 2016-07-12 | Discharge: 2016-07-12 | Disposition: A | Discharge status: Home / Self Care | Payer: BC Managed Care – PPO | Attending: Emergency Medicine | Admitting: Emergency Medicine

## 2016-07-12 DIAGNOSIS — I10 Essential (primary) hypertension: Secondary | ICD-10-CM | POA: Diagnosis not present

## 2016-07-12 DIAGNOSIS — Z79899 Other long term (current) drug therapy: Secondary | ICD-10-CM | POA: Insufficient documentation

## 2016-07-12 DIAGNOSIS — Z791 Long term (current) use of non-steroidal anti-inflammatories (NSAID): Secondary | ICD-10-CM | POA: Diagnosis not present

## 2016-07-12 DIAGNOSIS — E119 Type 2 diabetes mellitus without complications: Secondary | ICD-10-CM | POA: Diagnosis not present

## 2016-07-12 DIAGNOSIS — Z794 Long term (current) use of insulin: Secondary | ICD-10-CM | POA: Insufficient documentation

## 2016-07-12 DIAGNOSIS — L0201 Cutaneous abscess of face: Secondary | ICD-10-CM | POA: Diagnosis not present

## 2016-07-12 DIAGNOSIS — Z7984 Long term (current) use of oral hypoglycemic drugs: Secondary | ICD-10-CM | POA: Insufficient documentation

## 2016-07-12 LAB — CBG MONITORING, ED: Glucose-Capillary: 182 mg/dL — ABNORMAL HIGH (ref 65–99)

## 2016-07-12 MED ORDER — OXYCODONE-ACETAMINOPHEN 5-325 MG PO TABS
1.0000 | ORAL_TABLET | Freq: Once | ORAL | Status: AC
  Administered 2016-07-12: 1 via ORAL
  Filled 2016-07-12: qty 1

## 2016-07-12 MED ORDER — CLINDAMYCIN HCL 300 MG PO CAPS: 300.0000 mg | ORAL_CAPSULE | Freq: Four times a day (QID) | ORAL | 0 refills | Status: DC

## 2016-07-12 MED ORDER — OXYCODONE-ACETAMINOPHEN 5-325 MG PO TABS: 1.0000 | ORAL_TABLET | ORAL | 0 refills | Status: DC | PRN

## 2016-07-12 MED ORDER — LIDOCAINE-EPINEPHRINE (PF) 2 %-1:200000 IJ SOLN
10.0000 mL | Freq: Once | INTRAMUSCULAR | Status: AC
  Administered 2016-07-12: 10 mL
  Filled 2016-07-12: qty 20

## 2016-07-12 MED ORDER — CLINDAMYCIN HCL 150 MG PO CAPS
300.0000 mg | ORAL_CAPSULE | Freq: Once | ORAL | Status: AC
  Administered 2016-07-12: 300 mg via ORAL
  Filled 2016-07-12: qty 2

## 2016-07-12 NOTE — ED Triage Notes (Signed)
Pt has an abscess to right face since Monday, states he was seen here for same and started on antibiotics.  Pt states he pulled the scab off of the area and it started bleeding and is now more painful

## 2016-07-12 NOTE — ED Provider Notes (Signed)
[facility] Provider Note   CSN: [id number] Arrival date & time: 07/12/16  0100     History   Chief Complaint Chief Complaint  Patient presents with  . Abscess    facial    HPI Geoffrey Jackson is a 50 y.o. male.  The history is provided by the patient.  Abscess  He has a history of hypertension and diabetes. 3 days ago, he was in the ED with a facial abscess. Incision and drainage was recommended but he wanted to try antibiotics first. He was sent home with prescription for trimethoprim-sulfamethoxazole, but abscesses gotten worse. He is complaining of pain at 10/10. He denies fever, chills, sweats. There has been some slight drainage.  Past Medical History:  Diagnosis Date  . Benign prostatic hypertrophy   . Essential hypertension   . GERD (gastroesophageal reflux disease)   . Hypertension   . Low back pain   . Noncompliance with medications   . Obesity   . OSA (obstructive sleep apnea)    Non-compliant with CPAP  . Type 2 diabetes mellitus [facility](HCC)     Patient Active Problem List   Diagnosis Date Noted  . Hypertensive urgency 12/17/2015  . Nausea, vomiting, and diarrhea 12/17/2015  . Headache 12/17/2015  . Hypertensive emergency 12/17/2015  . CAP (community acquired pneumonia) 06/15/2015  . Elevated troponin I level 06/15/2015  . Biceps tendonitis on right   . Tear of biceps muscle 10/25/2013  . Right shoulder pain 10/25/2013  . Chest pain 04/13/2013  . Diabetes mellitus type 2 in obese (HCC) 02/09/2007  . Hyperlipidemia 12/08/2006  . Obesity 12/08/2006  . Essential hypertension 12/08/2006  . GERD 12/08/2006  . BENIGN PROSTATIC HYPERTROPHY 12/08/2006    Past Surgical History:  Procedure Laterality Date  . BICEPT TENODESIS Right 11/10/2014   Procedure: BICEPS TENODESIS;  Surgeon: Vickki HearingStanley E Harrison, MD;  Location: [facility] ORS;  Service: Orthopedics;  Laterality: Right;  biceps tendon  . SHOULDER ARTHROSCOPY    . SHOULDER ARTHROSCOPY  05/21/2012   Procedure: ARTHROSCOPY SHOULDER;  Surgeon: Vickki HearingStanley E Harrison, MD;  Location: [facility] OR[facility];  Service: Orthopedics;  Laterality: Right;  . SPINE SURGERY     Dr Danielle DessElsner   . SUBACROMIAL DECOMPRESSION  05/21/2012   Procedure: SUBACROMIAL DECOMPRESSION;  Surgeon: Vickki HearingStanley E Harrison, MD;  Location: [facility] OR[facility];  Service: Orthopedics;  Laterality: Right;       Home Medications    Prior to Admission medications   Medication Sig Start Date End Date Taking? Authorizing Provider  albuterol (PROVENTIL HFA;VENTOLIN HFA) 108 (90 BASE) MCG/ACT inhaler Inhale 2 puffs into the lungs every 4 (four) hours as needed for wheezing or shortness of breath. 06/21/15   Burgess AmorJulie Idol, PA-C  amLODipine (NORVASC) 10 MG tablet Take 10 mg by mouth daily. 11/15/15   Historical Provider, MD  BENICAR 40 MG tablet Take 40 mg by mouth daily.  09/16/15   Historical Provider, MD  BYSTOLIC 5 MG tablet Take 1 tablet by mouth daily. 10/09/14   Historical Provider, MD  cloNIDine (CATAPRES) 0.2 MG tablet Take 1 tablet by mouth 2 (two) times daily. 04/30/15   Historical Provider, MD  famotidine (PEPCID) 20 MG tablet Take 1 tablet (20 mg total) by mouth 2 (two) times daily. 06/21/15   Burgess AmorJulie Idol, PA-C  HYDROcodone-acetaminophen (NORCO/VICODIN) 5-325 MG tablet Take 1 tablet by mouth every 4 (four) hours as needed. 07/08/16   Jacalyn LefevreJulie Haviland, MD  ibuprofen (ADVIL,MOTRIN) 800 MG tablet Take 1 tablet (800 mg total) by mouth every 8 (  eight) hours as needed. 06/10/16   Vickki HearingStanley E Harrison, MD  indomethacin (INDOCIN) 25 MG capsule Take 1 capsule (25 mg total) by mouth 3 (three) times daily with meals. 03/26/16   Vickki HearingStanley E Harrison, MD  Insulin Glargine (TOUJEO SOLOSTAR) 300 UNIT/ML SOPN Inject 50 Units into the skin at bedtime. 12/19/15   [name], MD  metFORMIN (GLUCOPHAGE) 500 MG tablet Take 500 mg by mouth 2 (two) times daily with a meal.     Historical Provider, MD  methocarbamol (ROBAXIN) 500 MG tablet Take 1 tablet (500 mg total) by mouth 4 (four) times  daily. 03/26/16   Vickki HearingStanley E Harrison, MD  nitroGLYCERIN (NITROSTAT) 0.4 MG SL tablet Place 1 tablet (0.4 mg total) under the tongue every 5 (five) minutes as needed for chest pain. 06/19/15   [name], MD  potassium chloride SA (K-DUR,KLOR-CON) 20 MEQ tablet Take 1 tablet (20 mEq total) by mouth daily. 12/19/15   [name], MD  pravastatin (PRAVACHOL) 40 MG tablet Take 40 mg by mouth daily.    Historical Provider, MD  sulfamethoxazole-trimethoprim (BACTRIM DS,SEPTRA DS) 800-160 MG tablet Take 1 tablet by mouth 2 (two) times daily. 07/08/16 07/15/16  Jacalyn LefevreJulie Haviland, MD  tamsulosin (FLOMAX) 0.4 MG CAPS capsule Take 1 capsule by mouth daily. 11/15/15   Historical Provider, MD    Family History Family History  Problem Relation Age of Onset  . Diabetes Father   . Hypertension Father   . Heart failure Father   . Kidney disease Father   . Diabetes Sister   . Hypertension Sister   . Hypertension Mother     Social History Social History  Substance Use Topics  . Smoking status: Never Smoker  . Smokeless tobacco: Never Used  . Alcohol use 0.0 oz/week     Comment: Occasionally     Allergies   Demerol [meperidine] and Lansoprazole   Review of Systems Review of Systems  All other systems reviewed and are negative.    Physical Exam Updated Vital Signs BP 164/92 (BP Location: Right Arm)   Pulse 92   Temp 99.2 F (37.3 C) (Oral)   Resp 24   Ht 6\' 1"  (1.854 m)   Wt 255 lb (115.7 kg)   SpO2 97%   BMI 33.64 kg/m   Physical Exam  Nursing note and vitals reviewed.  50 year old male, resting comfortably and in no acute distress. Vital signs are significant for hypertension and tachypnea. Oxygen saturation is 97%, which is normal. Head is normocephalic. PERRLA, EOMI. Oropharynx is clear. There is an indurated and swollen area in the right malar region with punctate areas of purulent drainage - induration measures 5 x 6 cm. No dental caries or abscess noted. Neck is  nontender and supple without adenopathy or JVD. Back is nontender and there is no CVA tenderness. Lungs are clear without rales, wheezes, or rhonchi. Chest is nontender. Heart has regular rate and rhythm without murmur. Abdomen is soft, flat, nontender without masses or hepatosplenomegaly and peristalsis is normoactive. Extremities have no cyanosis or edema, full range of motion is present. Skin is warm and dry without rash. Neurologic: Mental status is normal, cranial nerves are intact, there are no motor or sensory deficits.  ED Treatments / Results  Labs (all labs ordered are listed, but only abnormal results are displayed) Labs Reviewed  CBG MONITORING, ED - Abnormal; Notable for the following:       Result Value   Glucose-Capillary 182 (*)    All other  components within normal limits    Procedures Procedures (including critical care time) INCISION AND DRAINAGE Performed by: AVWUJ,[email or web address] Consent: Verbal consent obtained. Risks and benefits: risks, benefits and alternatives were discussed Type: abscess  Body area: Right malar  Anesthesia: local infiltration  Incision was made with a scalpel.  Local anesthetic: lidocaine 2% with epinephrine  Anesthetic total: 2 ml  Complexity: complex Blunt dissection to break up loculations  Drainage: purulent  Drainage amount: Small   Packing material: 1/4 in iodoform gauze  Patient tolerance: Patient tolerated the procedure well with no immediate complications.    Medications Ordered in ED Medications  lidocaine-EPINEPHrine (XYLOCAINE W/EPI) 2 %-1:200000 (PF) injection 10 mL (not administered)  clindamycin (CLEOCIN) capsule 300 mg (not administered)  oxyCODONE-acetaminophen (PERCOCET/ROXICET) 5-325 MG per tablet 1 tablet (not administered)     Initial Impression / Assessment and Plan / ED Course  I have reviewed the triage vital signs and the nursing notes.  Pertinent labs that were available during my care of the  patient were reviewed by me and considered in my medical decision making (see chart for details).  Clinical Course   Facial abscess. Old records are reviewed confirming recent ED visit where incision and drainage was recommended and patient opting for course of oral antibiotics. Blood glucose will need to be checked and incision and drainages done.  Glucose is not excessively elevated. Incision and drainage yielded relatively small amount of pus. He is discharged with instructions to return in 2 days for packing removal and recheck. Antibiotic is changed to clindamycin and is given a prescription for oxycodone have acetaminophen for pain. Referred to ENT for follow-up.  Final Clinical Impressions(s) / ED Diagnoses   Final diagnoses:  Cutaneous abscess of face    New Prescriptions New Prescriptions   CLINDAMYCIN (CLEOCIN) 300 MG CAPSULE    Take 1 capsule (300 mg total) by mouth 4 (four) times daily. X 7 days   OXYCODONE-ACETAMINOPHEN (PERCOCET/ROXICET) 5-325 MG TABLET    Take 1 tablet by mouth every 4 (four) hours as needed.     Dione Booze, MD 07/12/16 [phone]

## 2016-07-12 NOTE — ED Notes (Signed)
Patient verbalizes understanding of discharge instructions, prescriptions, home care and follow up care. Patient out of department at this time. 

## 2016-07-12 NOTE — Discharge Instructions (Signed)
Return to the ED on Monday for packing removal and recheck.

## 2016-07-14 ENCOUNTER — Emergency Department (HOSPITAL_COMMUNITY)
Admission: EM | Admit: 2016-07-14 | Discharge: 2016-07-14 | Disposition: A | Discharge status: Home / Self Care | Payer: BC Managed Care – PPO | Attending: Emergency Medicine | Admitting: Emergency Medicine

## 2016-07-14 ENCOUNTER — Encounter (HOSPITAL_COMMUNITY): Payer: Self-pay | Admitting: *Deleted

## 2016-07-14 DIAGNOSIS — Z791 Long term (current) use of non-steroidal anti-inflammatories (NSAID): Secondary | ICD-10-CM | POA: Diagnosis not present

## 2016-07-14 DIAGNOSIS — Z794 Long term (current) use of insulin: Secondary | ICD-10-CM | POA: Insufficient documentation

## 2016-07-14 DIAGNOSIS — I10 Essential (primary) hypertension: Secondary | ICD-10-CM | POA: Diagnosis not present

## 2016-07-14 DIAGNOSIS — E119 Type 2 diabetes mellitus without complications: Secondary | ICD-10-CM | POA: Diagnosis not present

## 2016-07-14 DIAGNOSIS — Z4800 Encounter for change or removal of nonsurgical wound dressing: Secondary | ICD-10-CM

## 2016-07-14 DIAGNOSIS — Z7984 Long term (current) use of oral hypoglycemic drugs: Secondary | ICD-10-CM | POA: Diagnosis not present

## 2016-07-14 DIAGNOSIS — Z4801 Encounter for change or removal of surgical wound dressing: Secondary | ICD-10-CM | POA: Diagnosis not present

## 2016-07-14 DIAGNOSIS — Z79899 Other long term (current) drug therapy: Secondary | ICD-10-CM | POA: Insufficient documentation

## 2016-07-14 NOTE — ED Provider Notes (Signed)
[facility] Provider Note   CSN: [id number] Arrival date & time: 07/14/16  0537  Time Seen 06:07 AM   History   Chief Complaint Chief Complaint  Patient presents with  . Wound Check    HPI Geoffrey Jackson is a 50 y.o. male.  HPI patient was seen in the ED on 8/15 with abscess on his right cheek area. At that time he refused IND and was treated with antibiotics, Septra DS. He was seen again on August 19 and at this point agreed to have an IUD done which was done. It was recorded that there was minimal purulent drainage. He was then put on clindamycin. He returns now as instructed to have a 2 day check up and to have his packing removed. He reports the pain and swelling are improved. He is not having any fever. Do report the gauze is soaked with drainage when they change it once a day.  PCP Dr [name]  Past Medical History:  Diagnosis Date  . Benign prostatic hypertrophy   . Essential hypertension   . GERD (gastroesophageal reflux disease)   . Hypertension   . Low back pain   . Noncompliance with medications   . Obesity   . OSA (obstructive sleep apnea)    Non-compliant with CPAP  . Type 2 diabetes mellitus [facility])     Patient Active Problem List   Diagnosis Date Noted  . Hypertensive urgency 12/17/2015  . Nausea, vomiting, and diarrhea 12/17/2015  . Headache 12/17/2015  . Hypertensive emergency 12/17/2015  . CAP (community acquired pneumonia) 06/15/2015  . Elevated troponin I level 06/15/2015  . Biceps tendonitis on right   . Tear of biceps muscle 10/25/2013  . Right shoulder pain 10/25/2013  . Chest pain 04/13/2013  . Diabetes mellitus type 2 in obese (HCC) 02/09/2007  . Hyperlipidemia 12/08/2006  . Obesity 12/08/2006  . Essential hypertension 12/08/2006  . GERD 12/08/2006  . BENIGN PROSTATIC HYPERTROPHY 12/08/2006    Past Surgical History:  Procedure Laterality Date  . BICEPT TENODESIS Right 11/10/2014   Procedure: BICEPS TENODESIS;  Surgeon:  [name], MD;  Location: [facility] OR[facility];  Service: Orthopedics;  Laterality: Right;  biceps tendon  . SHOULDER ARTHROSCOPY    . SHOULDER ARTHROSCOPY  05/21/2012   Procedure: ARTHROSCOPY SHOULDER;  Surgeon: [name], MD;  Location: [facility];  Service: Orthopedics;  Laterality: Right;  . SPINE SURGERY     Dr Danielle Dess   . SUBACROMIAL DECOMPRESSION  05/21/2012   Procedure: SUBACROMIAL DECOMPRESSION;  Surgeon: [name], MD;  Location: [facility];  Service: Orthopedics;  Laterality: Right;       Home Medications    Prior to Admission medications   Medication Sig Start Date End Date Taking? Authorizing Provider  albuterol (PROVENTIL HFA;VENTOLIN HFA) 108 (90 BASE) MCG/ACT inhaler Inhale 2 puffs into the lungs every 4 (four) hours as needed for wheezing or shortness of breath. 06/21/15   Burgess Amor, PA-C  amLODipine (NORVASC) 10 MG tablet Take 10 mg by mouth daily. 11/15/15   Historical Provider, MD  BENICAR 40 MG tablet Take 40 mg by mouth daily.  09/16/15   Historical Provider, MD  BYSTOLIC 5 MG tablet Take 1 tablet by mouth daily. 10/09/14   Historical Provider, MD  clindamycin (CLEOCIN) 300 MG capsule Take 1 capsule (300 mg total) by mouth 4 (four) times daily. X 7 days 07/12/16   Dione Booze, MD  cloNIDine (CATAPRES) 0.2 MG tablet Take 1 tablet by mouth 2 (two)  times daily. 04/30/15   Historical Provider, MD  famotidine (PEPCID) 20 MG tablet Take 1 tablet (20 mg total) by mouth 2 (two) times daily. 06/21/15   Burgess AmorJulie Idol, PA-C  HYDROcodone-acetaminophen (NORCO/VICODIN) 5-325 MG tablet Take 1 tablet by mouth every 4 (four) hours as needed. 07/08/16   Jacalyn LefevreJulie Haviland, MD  ibuprofen (ADVIL,MOTRIN) 800 MG tablet Take 1 tablet (800 mg total) by mouth every 8 (eight) hours as needed. 06/10/16   Vickki HearingStanley E Harrison, MD  indomethacin (INDOCIN) 25 MG capsule Take 1 capsule (25 mg total) by mouth 3 (three) times daily with meals. 03/26/16   Vickki HearingStanley E Harrison, MD  Insulin Glargine (TOUJEO SOLOSTAR) 300  UNIT/ML SOPN Inject 50 Units into the skin at bedtime. 12/19/15   [name], MD  metFORMIN (GLUCOPHAGE) 500 MG tablet Take 500 mg by mouth 2 (two) times daily with a meal.     Historical Provider, MD  methocarbamol (ROBAXIN) 500 MG tablet Take 1 tablet (500 mg total) by mouth 4 (four) times daily. 03/26/16   Vickki HearingStanley E Harrison, MD  nitroGLYCERIN (NITROSTAT) 0.4 MG SL tablet Place 1 tablet (0.4 mg total) under the tongue every 5 (five) minutes as needed for chest pain. 06/19/15   [name], MD  oxyCODONE-acetaminophen (PERCOCET/ROXICET) 5-325 MG tablet Take 1 tablet by mouth every 4 (four) hours as needed. 07/12/16   Dione Boozeavid Glick, MD  potassium chloride SA (K-DUR,KLOR-CON) 20 MEQ tablet Take 1 tablet (20 mEq total) by mouth daily. 12/19/15   [name], MD  pravastatin (PRAVACHOL) 40 MG tablet Take 40 mg by mouth daily.    Historical Provider, MD  tamsulosin (FLOMAX) 0.4 MG CAPS capsule Take 1 capsule by mouth daily. 11/15/15   Historical Provider, MD    Family History Family History  Problem Relation Age of Onset  . Diabetes Father   . Hypertension Father   . Heart failure Father   . Kidney disease Father   . Diabetes Sister   . Hypertension Sister   . Hypertension Mother     Social History Social History  Substance Use Topics  . Smoking status: Never Smoker  . Smokeless tobacco: Never Used  . Alcohol use 0.0 oz/week     Comment: Occasionally     Allergies   Demerol [meperidine] and Lansoprazole   Review of Systems Review of Systems  All other systems reviewed and are negative.    Physical Exam Updated Vital Signs BP 165/98   Pulse 65   Temp 98.8 F (37.1 C) (Oral)   Resp 18   SpO2 96%   Vital signs normal    Physical Exam  Constitutional: He is oriented to person, place, and time. He appears well-developed and well-nourished.  Non-toxic appearance. He does not appear ill. No distress.  HENT:  Head: Normocephalic and atraumatic.  Right Ear:  External ear normal.  Left Ear: External ear normal.  Nose: Nose normal. No mucosal edema or rhinorrhea.  Mouth/Throat: Mucous membranes are normal. No dental abscesses or uvula swelling.  Eyes: Conjunctivae and EOM are normal.  Neck: Normal range of motion and full passive range of motion without pain.  Cardiovascular: Normal rate.   Pulmonary/Chest: Effort normal. No respiratory distress. He has no rhonchi. He exhibits no crepitus.  Abdominal: Normal appearance. There is no rebound.  Musculoskeletal: Normal range of motion.  Moves all extremities well.   Neurological: He is alert and oriented to person, place, and time. He has normal strength. No cranial nerve deficit.  Skin: Skin is warm,  dry and intact. No rash noted. No erythema. No pallor.  Patient has a large dressing on his right face. The dressing was removed. The gauze on the dressing is soaked with serosanguineous fluid. The packing was removed by myself. He has a small hole with some surrounding swelling approximately 1/2-27 m in size. One I expressed the edges of the swelling there is no drainage from the wound. Nursing staff reapplied a dressing.  Psychiatric: He has a normal mood and affect. His speech is normal and behavior is normal. His mood appears not anxious.  Nursing note and vitals reviewed.    ED Treatments / Results   Procedures Procedures (including critical care time)  Medications Ordered in ED Medications - No data to display   Initial Impression / Assessment and Plan / ED Course  I have reviewed the triage vital signs and the nursing notes.  Pertinent labs & imaging results that were available during my care of the patient were reviewed by me and considered in my medical decision making (see chart for details).  Clinical Course   We discussed local care including heat when they change the dressing, they will still need to change the dressing daily for several days because it can still has some drainage.  They were advised to be rechecked if he gets fever, worsening pain or swelling. Patient states he was advised he might need a referral to a specialist however since his symptoms are improving I do not feel like he needs that now.   Final Clinical Impressions(s) / ED Diagnoses   Final diagnoses:  Abscess packing removal   Plan discharge  Devoria AlbeIva Sharlyn Odonnel, MD, Concha PyoFACEP     Hesper Venturella, MD 07/14/16 [phone]

## 2016-07-14 NOTE — ED Triage Notes (Signed)
Pt returns to er for packing removal and wound recheck,

## 2016-07-14 NOTE — Discharge Instructions (Signed)
Finish your antibiotics. Use heat on the area when you change the dressing daily. It will still drain for a few days. Recheck if it gets more painful, more swollen or you seem worse such as a fever.

## 2016-07-15 ENCOUNTER — Telehealth: Payer: Self-pay | Admitting: Orthopedic Surgery

## 2016-07-15 NOTE — Telephone Encounter (Signed)
Routing to Dr Harrison for approval 

## 2016-07-15 NOTE — Telephone Encounter (Signed)
Patient called for refill on:  oxyCODONE-acetaminophen (PERCOCET/ROXICET) 5-325 MG tablet 15 tablet

## 2016-07-16 NOTE — Telephone Encounter (Signed)
He doesn't get Percocet please check the chart and give me the appropriate medicine for refill

## 2016-07-16 NOTE — Telephone Encounter (Signed)
Hydrocodone 5/325  The Oxycodone was from another provider on 8/19 #15

## 2016-07-16 NOTE — Telephone Encounter (Signed)
Then no

## 2016-07-17 ENCOUNTER — Telehealth: Payer: Self-pay | Admitting: Orthopedic Surgery

## 2016-07-17 NOTE — Telephone Encounter (Signed)
Patient said he understood, but he wanted Dr. Romeo AppleHarrison to know that the Oxycodone 5/325mg  was given to him at the ER, because he had a abcess on his face lanced.

## 2016-07-24 ENCOUNTER — Telehealth: Payer: Self-pay | Admitting: Orthopedic Surgery

## 2016-07-24 NOTE — Telephone Encounter (Signed)
Patient came by today and requested a refill on Hydrocodone/Acetaminophen (Norco) 5-325 mgs.   Sig: Take 1 tablet by mouth every 4 (four) hours as needed.

## 2016-07-24 NOTE — Telephone Encounter (Signed)
ROUTING TO DR HARRISON 

## 2016-07-24 NOTE — Telephone Encounter (Signed)
Needs drug passed first

## 2016-07-29 ENCOUNTER — Other Ambulatory Visit: Payer: Self-pay | Admitting: *Deleted

## 2016-07-29 ENCOUNTER — Other Ambulatory Visit: Payer: Self-pay | Admitting: Orthopedic Surgery

## 2016-07-29 DIAGNOSIS — R52 Pain, unspecified: Secondary | ICD-10-CM

## 2016-07-31 LAB — PAIN MGMT, PROFILE 1 W/O CONF, U
Amphetamines: NEGATIVE ng/mL (ref <500)
Barbiturates: NEGATIVE ng/mL (ref <300)
Benzodiazepines: NEGATIVE ng/mL (ref <100)
COCAINE METABOLITE: NEGATIVE ng/mL (ref <150)
CREATININE: 157.3 mg/dL (ref >=20.0)
MARIJUANA METABOLITE: NEGATIVE ng/mL (ref <20)
METHADONE METABOLITE: NEGATIVE ng/mL (ref <100)
OPIATES: NEGATIVE ng/mL (ref <100)
OXIDANT: NEGATIVE ug/mL (ref <200)
OXYCODONE: NEGATIVE ng/mL (ref <100)
PHENCYCLIDINE: NEGATIVE ng/mL (ref <25)
pH: 5.59 (ref 4.5–9.0)

## 2016-08-05 ENCOUNTER — Telehealth: Payer: Self-pay | Admitting: Orthopedic Surgery

## 2016-08-05 ENCOUNTER — Other Ambulatory Visit: Payer: Self-pay | Admitting: Orthopedic Surgery

## 2016-08-05 MED ORDER — HYDROCODONE-ACETAMINOPHEN 5-325 MG PO TABS: 1.0000 | ORAL_TABLET | Freq: Three times a day (TID) | ORAL | 0 refills | Status: DC | PRN

## 2016-08-05 NOTE — Telephone Encounter (Signed)
PICK UP TOMORROW

## 2016-08-05 NOTE — Telephone Encounter (Signed)
Patient is asking for pain medication. He has been drug screened and the results are back.  Please advise.

## 2016-09-11 ENCOUNTER — Telehealth: Payer: Self-pay | Admitting: Orthopedic Surgery

## 2016-09-11 NOTE — Telephone Encounter (Signed)
Patient requests refill:  HYDROcodone-acetaminophen (NORCO/VICODIN) 5-325 MG tablet 30 tablet  - insurance: [employer]

## 2016-09-11 NOTE — Telephone Encounter (Signed)
Routing to Dr Harrison for approval 

## 2016-09-12 ENCOUNTER — Other Ambulatory Visit: Payer: Self-pay | Admitting: *Deleted

## 2016-09-12 MED ORDER — HYDROCODONE-ACETAMINOPHEN 5-325 MG PO TABS: 1.0000 | ORAL_TABLET | Freq: Three times a day (TID) | ORAL | 0 refills | Status: DC | PRN

## 2016-09-12 NOTE — Telephone Encounter (Signed)
30 tabs  

## 2016-10-10 ENCOUNTER — Telehealth: Payer: Self-pay | Admitting: Orthopedic Surgery

## 2016-10-10 NOTE — Telephone Encounter (Signed)
ROUTING TO DR HARRISON FOR APPROVAL 

## 2016-10-10 NOTE — Telephone Encounter (Signed)
Patient requests refill on Hydrocodone/Acetaminophen (Norco)  5-325 mgs.   Qty  30  Sig: Take 1 tablet by mouth every 8 (eight) hours as needed.    Patient was last seen on 05-05-16.  That note states that this patient needs random urine test before next script.

## 2016-10-10 NOTE — Telephone Encounter (Signed)
YES HE DOES  NEEDS URINE TEST FIRST  NO EXCEPTIONS

## 2016-10-13 NOTE — Telephone Encounter (Signed)
PLEASE ADVISE OF BELOW IF PATIENT CALLS BACK

## 2016-10-14 NOTE — Telephone Encounter (Signed)
Patient did call back - I relayed to patient.

## 2016-10-15 ENCOUNTER — Other Ambulatory Visit: Payer: Self-pay | Admitting: *Deleted

## 2016-10-15 ENCOUNTER — Other Ambulatory Visit: Payer: Self-pay | Admitting: Orthopedic Surgery

## 2016-10-15 DIAGNOSIS — G8929 Other chronic pain: Secondary | ICD-10-CM

## 2016-10-20 ENCOUNTER — Other Ambulatory Visit: Payer: Self-pay | Admitting: Orthopedic Surgery

## 2016-10-20 LAB — PAIN MGMT, PROFILE 1 W/O CONF, U
AMPHETAMINES: NEGATIVE ng/mL (ref <500)
Barbiturates: NEGATIVE ng/mL (ref <300)
Benzodiazepines: NEGATIVE ng/mL (ref <100)
COCAINE METABOLITE: NEGATIVE ng/mL (ref <150)
CREATININE: 237.4 mg/dL (ref >=20.0)
MARIJUANA METABOLITE: NEGATIVE ng/mL (ref <20)
Methadone Metabolite: NEGATIVE ng/mL (ref <100)
OXIDANT: NEGATIVE ug/mL (ref <200)
OXYCODONE: NEGATIVE ng/mL (ref <100)
Opiates: POSITIVE ng/mL — AB (ref <100)
PHENCYCLIDINE: NEGATIVE ng/mL (ref <25)
pH: 6.12 (ref 4.5–9.0)

## 2016-10-20 MED ORDER — HYDROCODONE-ACETAMINOPHEN 5-325 MG PO TABS
1.0000 | ORAL_TABLET | Freq: Three times a day (TID) | ORAL | 0 refills | Status: DC | PRN
Start: 2016-10-20 — End: 2016-12-08

## 2016-10-20 NOTE — Telephone Encounter (Signed)
Patient's results reviewed by Dr Romeo AppleHarrison and prescription issued; patient aware.

## 2016-10-20 NOTE — Progress Notes (Signed)
Refill drug test done  Opiates appropriate in system

## 2016-12-04 ENCOUNTER — Telehealth: Payer: Self-pay | Admitting: Orthopedic Surgery

## 2016-12-04 NOTE — Telephone Encounter (Signed)
Patient requests refill on Hydrocodone/Acetaminophen (Norco)  5-325 mgs.    Sig: Take 1 tablet by mouth every 8 (eight) hours as needed

## 2016-12-05 NOTE — Telephone Encounter (Signed)
[place]  [facility] [place] [facility]

## 2016-12-05 NOTE — Telephone Encounter (Signed)
Routing to Dr Harrison for approval 

## 2016-12-08 ENCOUNTER — Encounter: Payer: Self-pay | Admitting: Orthopedic Surgery

## 2016-12-08 ENCOUNTER — Other Ambulatory Visit: Payer: Self-pay | Admitting: *Deleted

## 2016-12-08 MED ORDER — HYDROCODONE-ACETAMINOPHEN 5-325 MG PO TABS: 1.0000 | ORAL_TABLET | Freq: Three times a day (TID) | ORAL | 0 refills | Status: AC | PRN

## 2016-12-08 NOTE — Progress Notes (Signed)
[place] controlled substance reporting system reviewed  

## 2017-01-06 ENCOUNTER — Telehealth: Payer: Self-pay | Admitting: Orthopedic Surgery

## 2017-01-06 NOTE — Telephone Encounter (Signed)
Dr Romeo AppleHarrison is not available this week, please advise to go to ER for bad pain and swelling.

## 2017-01-06 NOTE — Telephone Encounter (Signed)
Patient walked in and was wanting to see Dr. Romeo AppleHarrison about his right arm and elbow. He says it is hurting real bad and its swollen. He was wanting to see what Dr. Romeo AppleHarrison wanted him to do before he goes to the ER. He stated that he has been taking ibuprofen and icing it for about 1 week now. He also stated that he doesn't know if he hurt it at work or not. He was wanting to speak with the nurse, but she was not available due to being on phone about another patient. I offered to make him an appointment , but he wanted to wait and see what Dr. Romeo AppleHarrison would suggest.

## 2017-01-07 NOTE — Telephone Encounter (Signed)
I called and spoke with the patient and advised him to go to ER for bad pain and swelling as you suggested.

## 2017-01-07 NOTE — Telephone Encounter (Signed)
I called back to patient offering appointment with Dr Hilda LiasKeeling, as Dr Romeo AppleHarrison is still out of office.  Left voice message to return call.

## 2017-01-07 NOTE — Telephone Encounter (Signed)
OR have him see Dr Hilda LiasKeeling this week?

## 2017-01-12 NOTE — Telephone Encounter (Signed)
No further response. 

## 2017-01-19 ENCOUNTER — Ambulatory Visit (INDEPENDENT_AMBULATORY_CARE_PROVIDER_SITE_OTHER): Payer: BC Managed Care – PPO

## 2017-01-19 ENCOUNTER — Encounter: Payer: Self-pay | Admitting: Orthopedic Surgery

## 2017-01-19 ENCOUNTER — Ambulatory Visit (INDEPENDENT_AMBULATORY_CARE_PROVIDER_SITE_OTHER): Payer: BC Managed Care – PPO | Admitting: Orthopedic Surgery

## 2017-01-19 VITALS — BP 168/104 | HR 89 | Ht 72.0 in | Wt 262.0 lb

## 2017-01-19 DIAGNOSIS — M7711 Lateral epicondylitis, right elbow: Secondary | ICD-10-CM

## 2017-01-19 DIAGNOSIS — R52 Pain, unspecified: Secondary | ICD-10-CM

## 2017-01-19 DIAGNOSIS — M542 Cervicalgia: Secondary | ICD-10-CM | POA: Diagnosis not present

## 2017-01-19 DIAGNOSIS — M25511 Pain in right shoulder: Secondary | ICD-10-CM

## 2017-01-19 DIAGNOSIS — M7021 Olecranon bursitis, right elbow: Secondary | ICD-10-CM

## 2017-01-19 MED ORDER — HYDROCODONE-ACETAMINOPHEN 5-325 MG PO TABS: 1.0000 | ORAL_TABLET | ORAL | 0 refills | Status: DC | PRN

## 2017-01-19 NOTE — Patient Instructions (Signed)

## 2017-01-19 NOTE — Progress Notes (Signed)
Progress note  Chief Complaint  Patient presents with  . Arm Pain    Right arm pain, no injury.    51 year old male who is status post arthroscopy right shoulder for evaluation of glenohumeral joint for labral tear and rotator cuff followed by an arthroscopy of the right shoulder biceps tenotomy arthroscopic subacromial decompression and debridement of the rotator cuff and two thirds and 13 followed by open biceps tenodesis in 2015 presents back with right elbow right arm pain  He has lateral pain over the right elbow which is exacerbated by wrist extension and grip requiring activities and he also has tenderness and swelling over the right olecranon bursa  He denies any erythema. No trauma has been noted. He also continues to have some upper biceps pain and fatigue   Review of Systems  Constitutional: Negative for fever, malaise/fatigue and weight loss.  Musculoskeletal: Negative for neck pain.  Neurological: Negative for sensory change.     Past Medical History:  Diagnosis Date  . Benign prostatic hypertrophy   . Essential hypertension   . GERD (gastroesophageal reflux disease)   . Hypertension   . Low back pain   . Noncompliance with medications   . Obesity   . OSA (obstructive sleep apnea)    Non-compliant with CPAP  . Type 2 diabetes mellitus (HCC)     Past Surgical History:  Procedure Laterality Date  . BICEPT TENODESIS Right 11/10/2014   Procedure: BICEPS TENODESIS;  Surgeon: [name], MD;  Location: [facility];  Service: Orthopedics;  Laterality: Right;  biceps tendon  . SHOULDER ARTHROSCOPY    . SHOULDER ARTHROSCOPY  05/21/2012   Procedure: ARTHROSCOPY SHOULDER;  Surgeon: [name], MD;  Location: [facility];  Service: Orthopedics;  Laterality: Right;  . SPINE SURGERY     Dr Danielle Dess   . SUBACROMIAL DECOMPRESSION  05/21/2012   Procedure: SUBACROMIAL DECOMPRESSION;  Surgeon: [name], MD;  Location: [facility];  Service: Orthopedics;  Laterality:  Right;    BP (!) 168/104   Pulse 89   Ht 6' (1.829 m)   Wt 262 lb (118.8 kg)   BMI 35.53 kg/m  Physical Exam  Constitutional: He is oriented to person, place, and time. He appears well-developed and well-nourished. No distress.  Cardiovascular: Normal rate and intact distal pulses.   Neurological: He is alert and oriented to person, place, and time.  Skin: Skin is warm and dry. No rash noted. He is not diaphoretic. No erythema. No pallor.  Psychiatric: He has a normal mood and affect. His behavior is normal. Judgment and thought content normal.   He has tenderness without swelling over the right lateral epicondyle with painful extension and extremes of flexion with painful wrist extension against resistance no strength loss no instability of the elbow no rashes noted pulses are good lymph nodes are negative in sensation remains normal  He has swelling of the olecranon bursa with tenderness and grainy miss to palpation  Left arm range of motion is normal and there is no swelling  C-spine films show autofusion of the mid cervical levels with joint space narrowing in the upper cervical levels. This is visualized down to C5  The shoulder x-rays show chronic changes of the greater tuberosity consistent with his surgical procedures  Impression  Encounter Diagnoses  Name Primary?  . Neck pain   . Right shoulder pain, unspecified chronicity   . Pain management   . Right tennis elbow Yes  . Olecranon bursitis, right elbow  Injection of the right tennis elbow and right olecranon bursa  Return 6 weeks  Urinalysis drug test  Procedure note injection for right tennis elbow   Diagnosis right tennis elbow  Anesthesia ethyl chloride was used Alcohol use is clean the skin  After we obtained verbal consent and timeout a 25-gauge needle was used to inject 40 mg of Depo-Medrol and 3 cc of 1% lidocaine just distal to the insertion of the ECRB  There were no complications and a  sterile bandage was applied.   Procedure note injection for right olecranon bursitis  Diagnosis right olecranon bursitis  Anesthesia ethyl chloride Alcohol 4 cleaning the skin  After verbal consent and timeout was taken a 25-gauge needle was used to inject the bursal area with Depo-Medrol 40 and 3 mL 1% lidocaine  This was well tolerated without complication  Encounter Diagnoses  Name Primary?  . Neck pain   . Right shoulder pain, unspecified chronicity   . Pain management   . Right tennis elbow Yes  . Olecranon bursitis, right elbow      He will return [place] 6 weeks [place]   Meds ordered this encounter  Medications  . HYDROcodone-acetaminophen (NORCO/VICODIN) 5-325 MG tablet    Sig: Take 1 tablet by mouth every 4 (four) hours as needed for moderate pain.    Dispense:  42 tablet    Refill:  0

## 2017-01-20 LAB — PAIN MGMT, PROFILE 1 W/O CONF, U
Amphetamines: NEGATIVE ng/mL (ref <500)
BARBITURATES: NEGATIVE ng/mL (ref <300)
BENZODIAZEPINES: NEGATIVE ng/mL (ref <100)
COCAINE METABOLITE: NEGATIVE ng/mL (ref <150)
CREATININE: 92.7 mg/dL (ref >=20.0)
METHADONE METABOLITE: NEGATIVE ng/mL (ref <100)
Marijuana Metabolite: NEGATIVE ng/mL (ref <20)
OPIATES: POSITIVE ng/mL — AB (ref <100)
Oxidant: NEGATIVE ug/mL (ref <200)
Oxycodone: NEGATIVE ng/mL (ref <100)
Phencyclidine: NEGATIVE ng/mL (ref <25)
pH: 6.93 (ref 4.5–9.0)

## 2017-01-25 IMAGING — CR DG CHEST 1V PORT
1 series · 1 of 1 positions shown · non-contrast
Comparison: 03/02/2014

CLINICAL DATA: Shortness of breath, cough, and left chest pain.

EXAM:
PORTABLE CHEST - 1 VIEW

[ap portable]
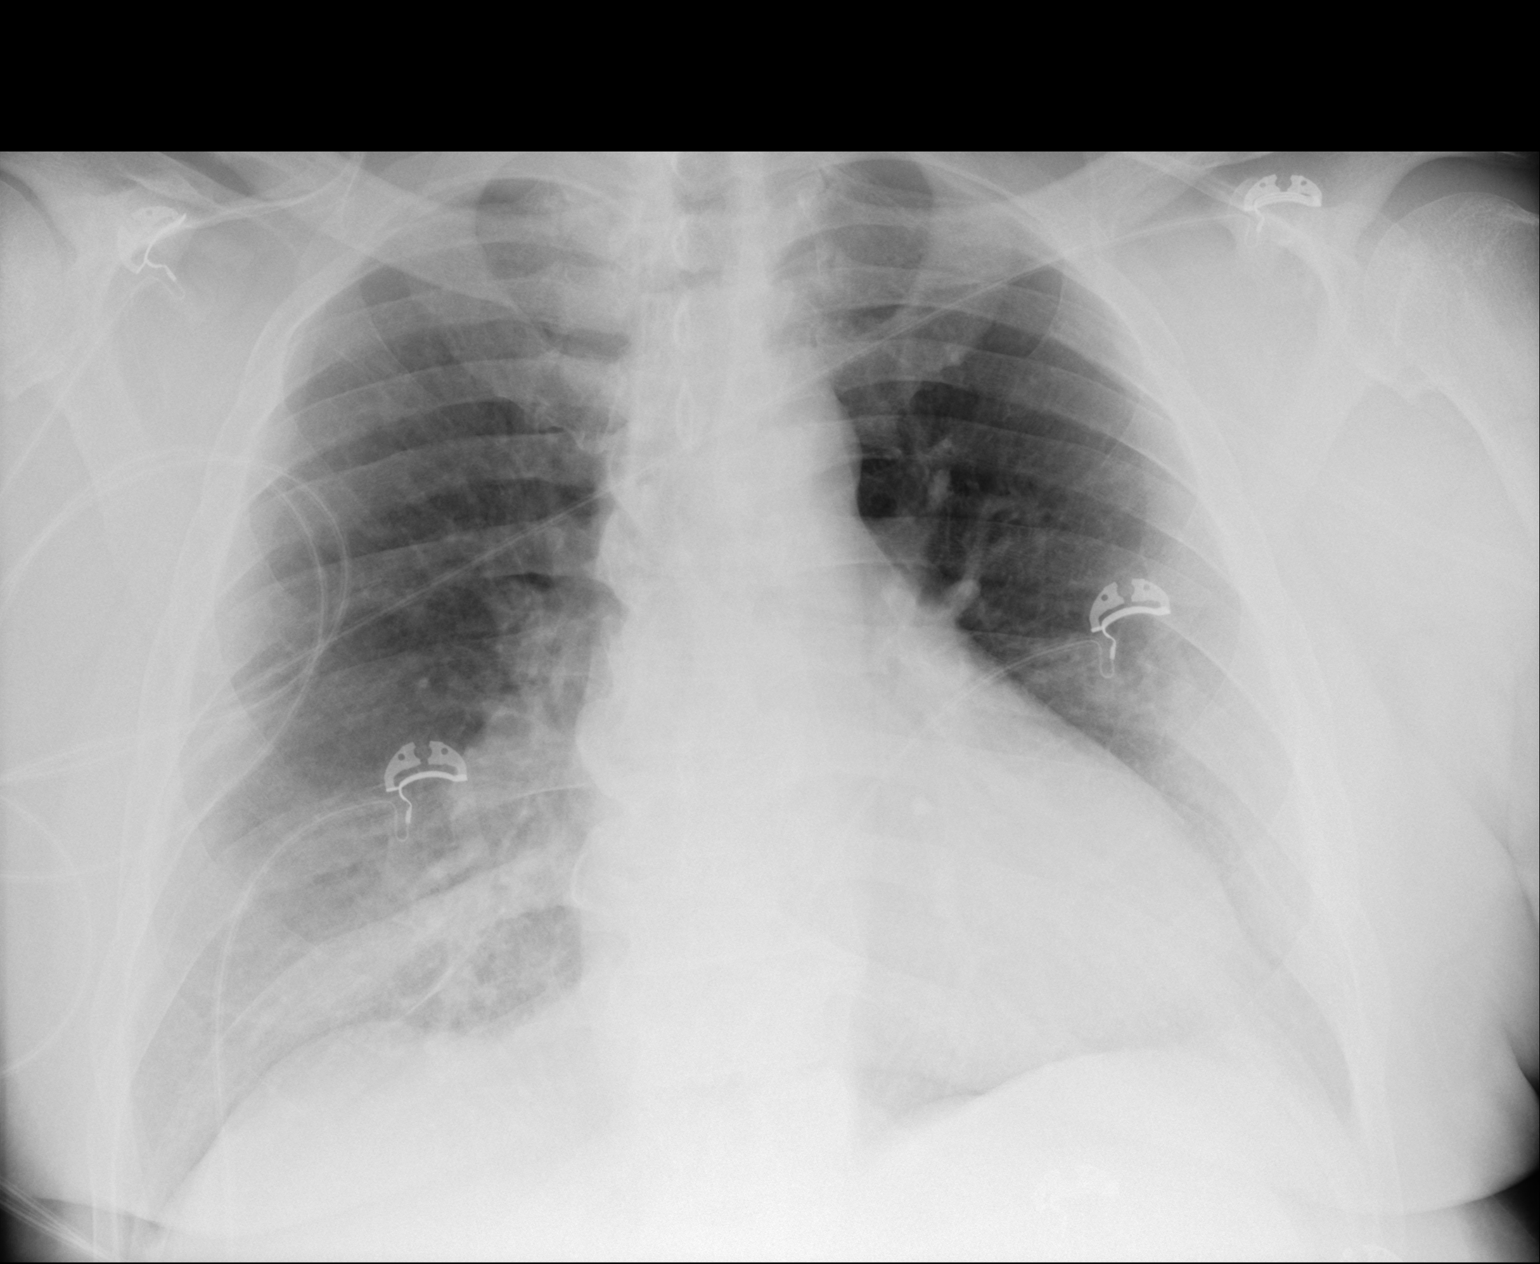

[1 of 1 positions shown; findings below may reference images not displayed]

FINDINGS: Cardiac silhouette is upper limits of normal in size. There are new
patchy opacities in the left midlung and right lung base. No pleural
effusion or pneumothorax is identified. No acute osseous abnormality
is seen.
IMPRESSION: Patchy left midlung and right basilar opacity suspicious for
pneumonia. Followup PA and lateral chest X-ray is recommended in 3-4
weeks following trial of antibiotic therapy to ensure resolution and
exclude underlying malignancy.

## 2017-01-25 IMAGING — DX DG CHEST 2V
2 series · 2 of 2 positions shown · non-contrast
Comparison: 06/15/2015.

CLINICAL DATA: Shortness of breath.  Chest pain.

EXAM:
CHEST  2 VIEW

[chest pa]
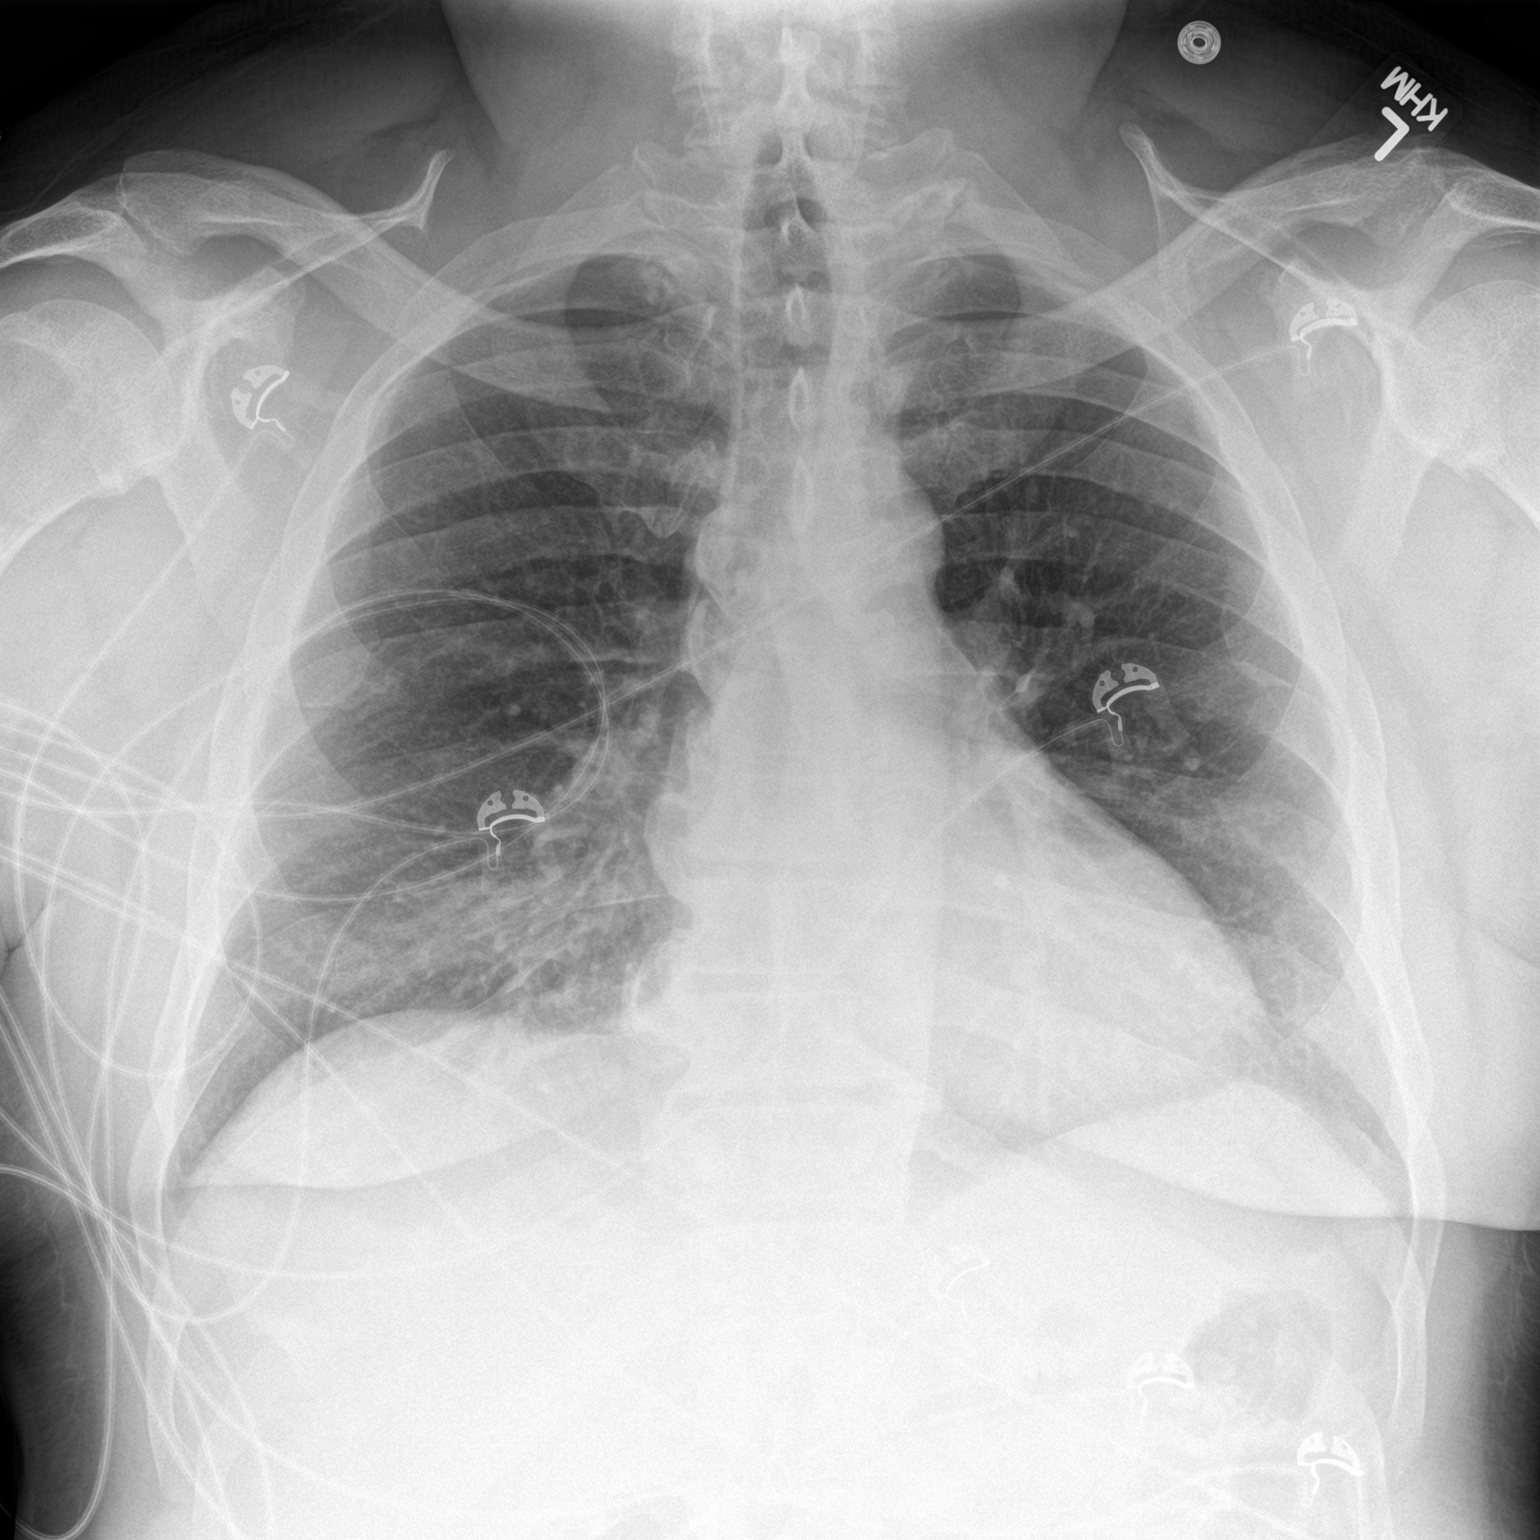

[chest lat]
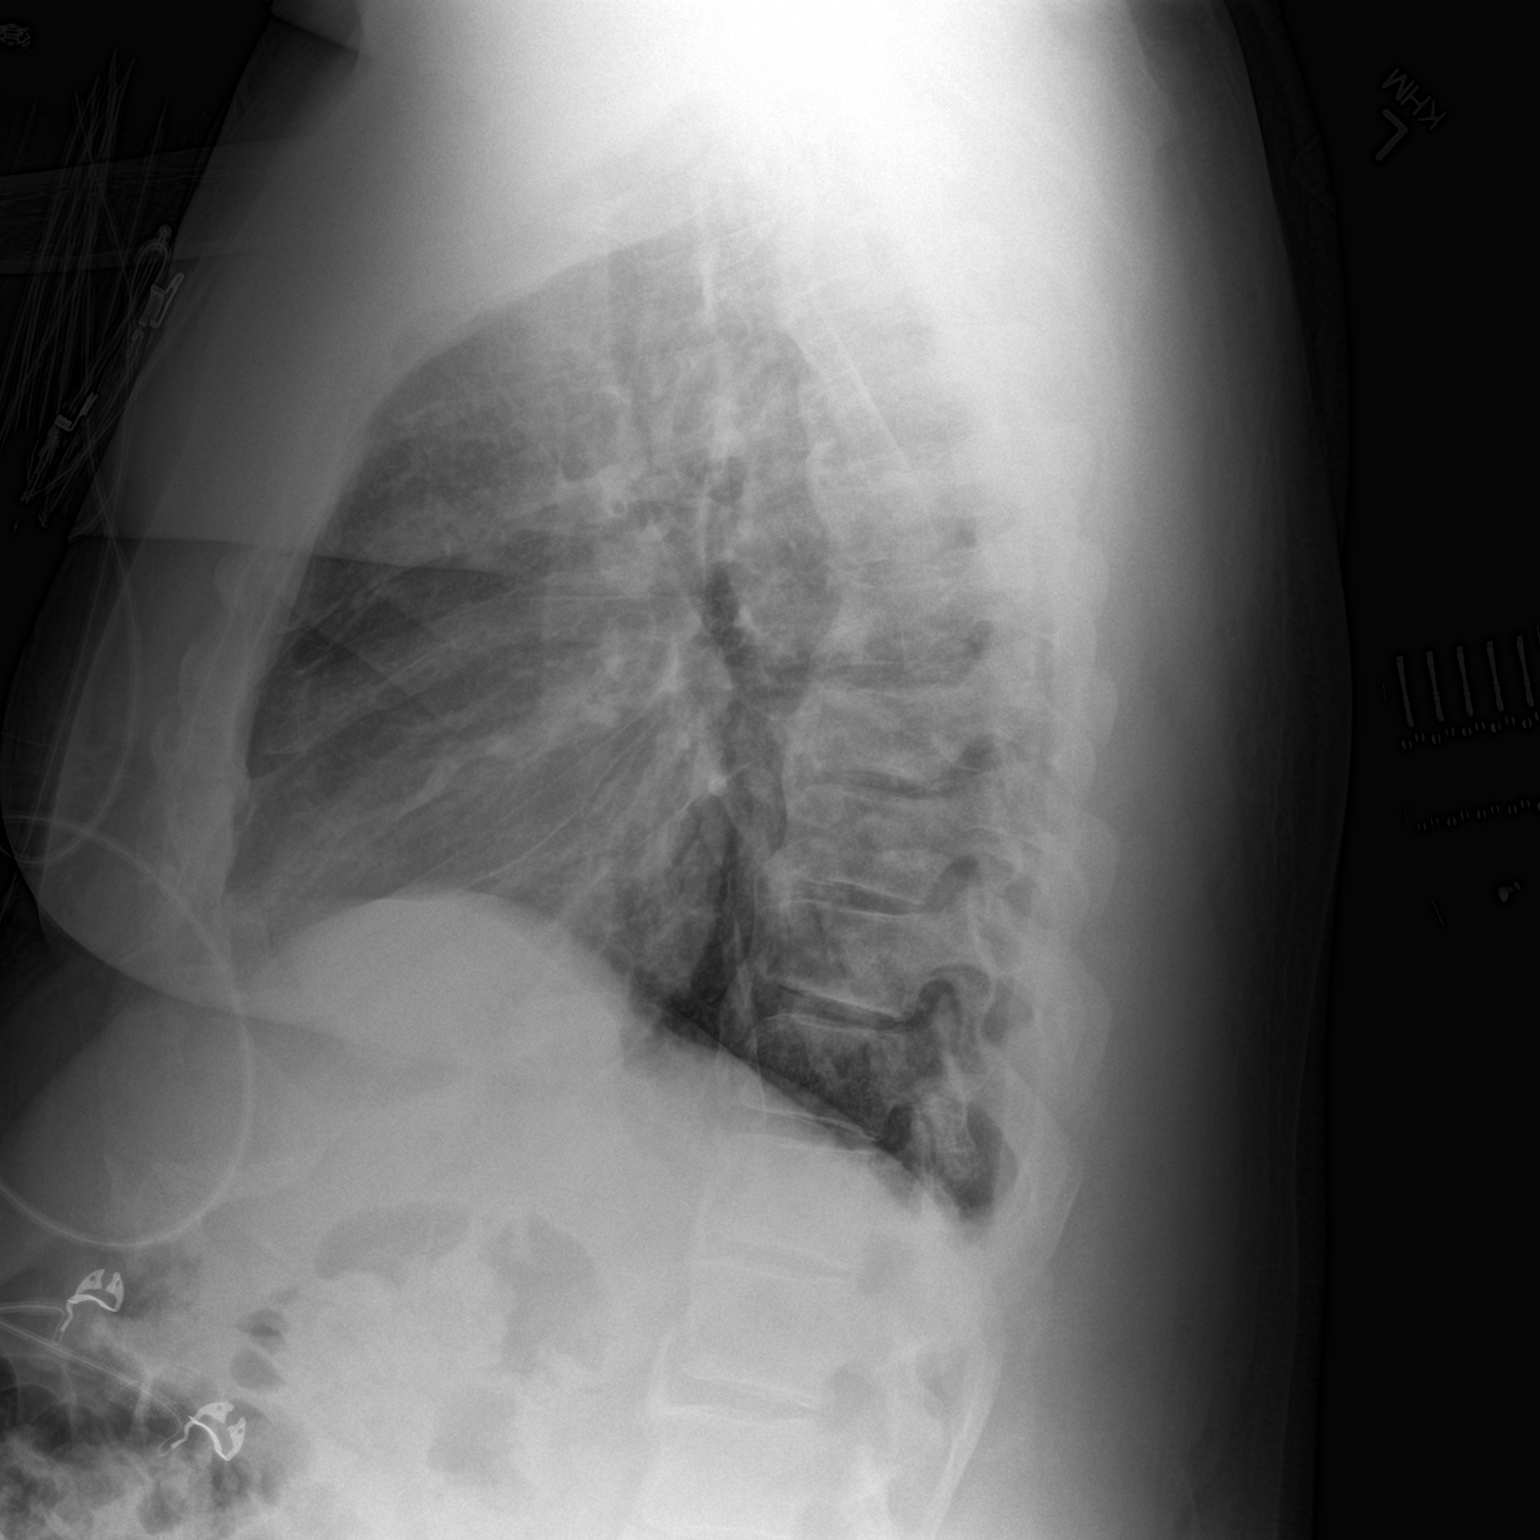

[2 of 2 positions shown; findings below may reference images not displayed]

FINDINGS: Mediastinum hilar structures normal . Heart size normal. Pulmonary
vascularity normal. Patchy right lower lobe and left mid lung field
infiltrates again noted as previously described. No pleural effusion
or pneumothorax. No acute bony abnormality. Degenerative changes
thoracic spine
IMPRESSION: Patchy right lower lobe and left mid lung field infiltrates noted
most consistent with pneumonia again noted. Followup PA and lateral
chest X-ray is recommended in 3-4 weeks following trial of
antibiotic therapy to ensure resolution and exclude underlying
malignancy.

## 2017-02-13 ENCOUNTER — Other Ambulatory Visit: Payer: Self-pay | Admitting: Orthopedic Surgery

## 2017-02-13 ENCOUNTER — Telehealth: Payer: Self-pay | Admitting: Orthopedic Surgery

## 2017-02-13 MED ORDER — HYDROCODONE-ACETAMINOPHEN 5-325 MG PO TABS: 1.0000 | ORAL_TABLET | ORAL | 0 refills | Status: DC | PRN

## 2017-02-13 NOTE — Progress Notes (Signed)
[place] controlled substance reporting system reviewed  

## 2017-02-13 NOTE — Telephone Encounter (Signed)
Routing to Dr Harrison for approval 

## 2017-02-13 NOTE — Telephone Encounter (Signed)
Patient requests refill on Hydrocodone/Acetaminophen 5-325   mgs.  Qty 42  Sig: Take 1 tablet by mouth every 4 (four) hours as needed for moderate pain.

## 2017-03-02 ENCOUNTER — Ambulatory Visit: Payer: BC Managed Care – PPO | Admitting: Orthopedic Surgery

## 2017-03-16 ENCOUNTER — Encounter: Payer: Self-pay | Admitting: Orthopedic Surgery

## 2017-03-16 ENCOUNTER — Ambulatory Visit (INDEPENDENT_AMBULATORY_CARE_PROVIDER_SITE_OTHER): Payer: BC Managed Care – PPO | Admitting: Orthopedic Surgery

## 2017-03-16 DIAGNOSIS — M7711 Lateral epicondylitis, right elbow: Secondary | ICD-10-CM | POA: Diagnosis not present

## 2017-03-16 MED ORDER — HYDROCODONE-ACETAMINOPHEN 5-325 MG PO TABS: 1.0000 | ORAL_TABLET | ORAL | 0 refills | Status: DC | PRN

## 2017-03-16 NOTE — Patient Instructions (Signed)
WEAR BRACE AL THE TIME   DO EXERCISES DAILY FOR 6 WEEKS

## 2017-03-16 NOTE — Progress Notes (Signed)
Progress Note   Patient ID: Geoffrey Jackson, male   DOB: 1966/10/10, 51 y.o.   MRN: [id number]  Chief Complaint  Patient presents with  . Follow-up    RIGHT TENNIS ELBOW    HPI Geoffrey Jackson is a 51 y.o. male.   HPI 51 year old male with chronic right arm pain including right tennis elbow status post injection did not improve continues to have lateral elbow pain and pain with wrist extension  Review of Systems Review of Systems Normal neuro  Denies fever  Examination There were no vitals taken for this visit.  Gen. appearance the patient's appearance is normal with normal grooming and  hygiene The patient is oriented to person place and time Mood and affect are normal   Ortho Exam   Right elbow tenderness over the lateral epicondyles this is worse with resisted elbow extension  Stability tests are normal  Motor exam 5/5 manual muscle testing , no atrophy  Skin is normal (no rash or erythema)    Medical decision-making Diagnosis, Data, Plan (risk)  Encounter Diagnosis  Name Primary?  . Right tennis elbow Yes  Brace, super 7 exercises  Follow-up 6 weeks Fuller Canada, MD 03/16/2017 4:22 PM

## 2017-04-03 ENCOUNTER — Telehealth: Payer: Self-pay | Admitting: Orthopedic Surgery

## 2017-04-03 NOTE — Telephone Encounter (Signed)
Hydrocodone-Acetaminophen  5/325 mg  Qty 42 Tablets  Take 1 tablet by mouth every 4 hours (four) hours as needed for moderate pain.

## 2017-04-03 NOTE — Telephone Encounter (Signed)
Routing to Dr Harrison for approval 

## 2017-04-06 ENCOUNTER — Other Ambulatory Visit: Payer: Self-pay | Admitting: Orthopedic Surgery

## 2017-04-06 MED ORDER — HYDROCODONE-ACETAMINOPHEN 5-325 MG PO TABS: 1.0000 | ORAL_TABLET | ORAL | 0 refills | Status: DC | PRN

## 2017-04-27 ENCOUNTER — Ambulatory Visit: Payer: BC Managed Care – PPO | Admitting: Orthopedic Surgery

## 2017-05-11 ENCOUNTER — Ambulatory Visit: Payer: BC Managed Care – PPO | Admitting: Orthopedic Surgery

## 2017-05-11 ENCOUNTER — Encounter: Payer: Self-pay | Admitting: Orthopedic Surgery

## 2017-05-15 ENCOUNTER — Ambulatory Visit (INDEPENDENT_AMBULATORY_CARE_PROVIDER_SITE_OTHER): Payer: BC Managed Care – PPO | Admitting: Orthopedic Surgery

## 2017-05-15 VITALS — BP 192/106 | HR 76 | Ht 72.0 in | Wt 255.0 lb

## 2017-05-15 DIAGNOSIS — M25511 Pain in right shoulder: Secondary | ICD-10-CM | POA: Diagnosis not present

## 2017-05-15 DIAGNOSIS — M7711 Lateral epicondylitis, right elbow: Secondary | ICD-10-CM

## 2017-05-15 MED ORDER — HYDROCODONE-ACETAMINOPHEN 5-325 MG PO TABS: 1.0000 | ORAL_TABLET | ORAL | 0 refills | Status: DC | PRN

## 2017-05-15 MED ORDER — METHOCARBAMOL 500 MG PO TABS: 500.0000 mg | ORAL_TABLET | Freq: Four times a day (QID) | ORAL | 2 refills | Status: DC
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
## 2017-06-10 ENCOUNTER — Telehealth: Payer: Self-pay | Admitting: Orthopedic Surgery

## 2017-06-10 NOTE — Telephone Encounter (Signed)
Hydrocodone-Acetaminophen 5/325 mg  Qty 42 Tablets  Take 1 tablet by mouth every 4 (four) hours as needed for moderate pain

## 2017-06-11 NOTE — Telephone Encounter (Signed)
Routing to Dr Harrison for approval 

## 2017-06-12 ENCOUNTER — Other Ambulatory Visit: Payer: Self-pay | Admitting: Orthopedic Surgery

## 2017-06-12 MED ORDER — HYDROCODONE-ACETAMINOPHEN 5-325 MG PO TABS: 1.0000 | ORAL_TABLET | ORAL | 0 refills | Status: DC | PRN

## 2017-07-03 ENCOUNTER — Telehealth: Payer: Self-pay | Admitting: Orthopedic Surgery

## 2017-07-03 ENCOUNTER — Other Ambulatory Visit: Payer: Self-pay | Admitting: Orthopedic Surgery

## 2017-07-03 MED ORDER — HYDROCODONE-ACETAMINOPHEN 5-325 MG PO TABS: 1.0000 | ORAL_TABLET | ORAL | 0 refills | Status: DC | PRN

## 2017-07-03 NOTE — Telephone Encounter (Signed)
Patient called for refill:  HYDROcodone-acetaminophen (NORCO/VICODIN) 5-325 MG tablet 42 tablet  (- nurse out of clinic at this time)

## 2017-07-26 ENCOUNTER — Telehealth: Payer: Self-pay | Admitting: Orthopedic Surgery

## 2017-07-28 NOTE — Telephone Encounter (Signed)
Routing to Dr. Keeling 

## 2017-07-30 ENCOUNTER — Emergency Department (HOSPITAL_COMMUNITY): Payer: BC Managed Care – PPO

## 2017-07-30 ENCOUNTER — Observation Stay (HOSPITAL_COMMUNITY)
Admission: EM | Admit: 2017-07-30 | Discharge: 2017-08-01 | Disposition: A | Discharge status: Home / Self Care | Payer: BC Managed Care – PPO | Attending: Family Medicine | Admitting: Family Medicine

## 2017-07-30 ENCOUNTER — Encounter (HOSPITAL_COMMUNITY): Payer: Self-pay

## 2017-07-30 DIAGNOSIS — Z794 Long term (current) use of insulin: Secondary | ICD-10-CM | POA: Insufficient documentation

## 2017-07-30 DIAGNOSIS — E7801 Familial hypercholesterolemia: Secondary | ICD-10-CM

## 2017-07-30 DIAGNOSIS — I10 Essential (primary) hypertension: Secondary | ICD-10-CM | POA: Diagnosis not present

## 2017-07-30 DIAGNOSIS — R778 Other specified abnormalities of plasma proteins: Secondary | ICD-10-CM | POA: Diagnosis present

## 2017-07-30 DIAGNOSIS — E1169 Type 2 diabetes mellitus with other specified complication: Secondary | ICD-10-CM

## 2017-07-30 DIAGNOSIS — R0602 Shortness of breath: Secondary | ICD-10-CM | POA: Insufficient documentation

## 2017-07-30 DIAGNOSIS — E669 Obesity, unspecified: Secondary | ICD-10-CM | POA: Diagnosis present

## 2017-07-30 DIAGNOSIS — Z79899 Other long term (current) drug therapy: Secondary | ICD-10-CM | POA: Insufficient documentation

## 2017-07-30 DIAGNOSIS — E119 Type 2 diabetes mellitus without complications: Secondary | ICD-10-CM | POA: Insufficient documentation

## 2017-07-30 DIAGNOSIS — K219 Gastro-esophageal reflux disease without esophagitis: Secondary | ICD-10-CM

## 2017-07-30 DIAGNOSIS — R55 Syncope and collapse: Secondary | ICD-10-CM | POA: Diagnosis present

## 2017-07-30 DIAGNOSIS — R748 Abnormal levels of other serum enzymes: Secondary | ICD-10-CM

## 2017-07-30 DIAGNOSIS — R7989 Other specified abnormal findings of blood chemistry: Secondary | ICD-10-CM | POA: Diagnosis present

## 2017-07-30 DIAGNOSIS — I161 Hypertensive emergency: Secondary | ICD-10-CM

## 2017-07-30 LAB — COMPREHENSIVE METABOLIC PANEL
ALT: 28 U/L (ref 17–63)
ANION GAP: 7 (ref 5–15)
AST: 28 U/L (ref 15–41)
Albumin: 3.8 g/dL (ref 3.5–5.0)
Alkaline Phosphatase: 55 U/L (ref 38–126)
BUN: 19 mg/dL (ref 6–20)
CALCIUM: 8.7 mg/dL — AB (ref 8.9–10.3)
CHLORIDE: 110 mmol/L (ref 101–111)
CO2: 24 mmol/L (ref 22–32)
CREATININE: 1.1 mg/dL (ref 0.61–1.24)
Glucose, Bld: 154 mg/dL — ABNORMAL HIGH (ref 65–99)
Potassium: 3.3 mmol/L — ABNORMAL LOW (ref 3.5–5.1)
Sodium: 141 mmol/L (ref 135–145)
Total Bilirubin: 0.8 mg/dL (ref 0.3–1.2)
Total Protein: 7 g/dL (ref 6.5–8.1)

## 2017-07-30 LAB — CBG MONITORING, ED: GLUCOSE-CAPILLARY: 137 mg/dL — AB (ref 65–99)

## 2017-07-30 LAB — CBC WITH DIFFERENTIAL/PLATELET
BASOS ABS: 0 10*3/uL (ref 0.0–0.1)
BASOS PCT: 1 %
EOS ABS: 0.1 10*3/uL (ref 0.0–0.7)
EOS PCT: 2 %
HCT: 42 % (ref 39.0–52.0)
HEMOGLOBIN: 13.8 g/dL (ref 13.0–17.0)
Lymphocytes Relative: 40 %
Lymphs Abs: 1.8 10*3/uL (ref 0.7–4.0)
MCH: 30.1 pg (ref 26.0–34.0)
MCHC: 32.9 g/dL (ref 30.0–36.0)
MCV: 91.5 fL (ref 78.0–100.0)
Monocytes Absolute: 0.2 10*3/uL (ref 0.1–1.0)
Monocytes Relative: 5 %
NEUTROS PCT: 52 %
Neutro Abs: 2.3 10*3/uL (ref 1.7–7.7)
PLATELETS: 170 10*3/uL (ref 150–400)
RBC: 4.59 MIL/uL (ref 4.22–5.81)
RDW: 13.6 % (ref 11.5–15.5)
WBC: 4.4 10*3/uL (ref 4.0–10.5)

## 2017-07-30 LAB — GLUCOSE, CAPILLARY
Glucose-Capillary: 141 mg/dL — ABNORMAL HIGH (ref 65–99)
Glucose-Capillary: 151 mg/dL — ABNORMAL HIGH (ref 65–99)

## 2017-07-30 LAB — TROPONIN I
TROPONIN I: 0.03 ng/mL — AB (ref <0.03)
TROPONIN I: 0.03 ng/mL — AB (ref <0.03)

## 2017-07-30 LAB — TSH: TSH: 1.102 u[IU]/mL (ref 0.350–4.500)

## 2017-07-30 MED ORDER — ASPIRIN 81 MG PO CHEW
81.0000 mg | CHEWABLE_TABLET | Freq: Every day | ORAL | Status: DC
  Administered 2017-07-31 – 2017-08-01 (×2): 81 mg via ORAL
  Filled 2017-07-30 (×2): qty 1

## 2017-07-30 MED ORDER — HYDROCODONE-ACETAMINOPHEN 5-325 MG PO TABS
1.0000 | ORAL_TABLET | ORAL | Status: DC | PRN
  Administered 2017-07-30 – 2017-07-31 (×2): 1 via ORAL
  Administered 2017-07-31 (×2): 2 via ORAL
  Administered 2017-07-31: 1 via ORAL
  Filled 2017-07-30: qty 2
  Filled 2017-07-30: qty 1
  Filled 2017-07-30: qty 2
  Filled 2017-07-30 (×3): qty 1

## 2017-07-30 MED ORDER — ENOXAPARIN SODIUM 40 MG/0.4ML ~~LOC~~ SOLN
40.0000 mg | SUBCUTANEOUS | Status: DC
  Administered 2017-07-30 – 2017-07-31 (×2): 40 mg via SUBCUTANEOUS
  Filled 2017-07-30 (×2): qty 0.4

## 2017-07-30 MED ORDER — PRAVASTATIN SODIUM 40 MG PO TABS
40.0000 mg | ORAL_TABLET | Freq: Every day | ORAL | Status: DC
  Administered 2017-07-30 – 2017-07-31 (×2): 40 mg via ORAL
  Filled 2017-07-30 (×2): qty 1

## 2017-07-30 MED ORDER — INSULIN ASPART 100 UNIT/ML ~~LOC~~ SOLN
0.0000 [IU] | Freq: Three times a day (TID) | SUBCUTANEOUS | Status: DC
  Administered 2017-07-31 – 2017-08-01 (×2): 3 [IU] via SUBCUTANEOUS

## 2017-07-30 MED ORDER — ONDANSETRON HCL 4 MG PO TABS: 4.0000 mg | ORAL_TABLET | Freq: Four times a day (QID) | ORAL | Status: DC | PRN

## 2017-07-30 MED ORDER — ONDANSETRON HCL 4 MG/2ML IJ SOLN: 4.0000 mg | Freq: Four times a day (QID) | INTRAMUSCULAR | Status: DC | PRN

## 2017-07-30 MED ORDER — MORPHINE SULFATE (PF) 2 MG/ML IV SOLN: 2.0000 mg | INTRAVENOUS | Status: DC | PRN

## 2017-07-30 MED ORDER — IRBESARTAN 300 MG PO TABS
300.0000 mg | ORAL_TABLET | Freq: Every day | ORAL | Status: DC
  Administered 2017-07-31: 300 mg via ORAL
  Filled 2017-07-30: qty 1

## 2017-07-30 MED ORDER — SODIUM CHLORIDE 0.9 % IV BOLUS (SEPSIS)
1000.0000 mL | Freq: Once | INTRAVENOUS | Status: AC
  Administered 2017-07-30: 1000 mL via INTRAVENOUS

## 2017-07-30 MED ORDER — SODIUM CHLORIDE 0.9% FLUSH
3.0000 mL | Freq: Two times a day (BID) | INTRAVENOUS | Status: DC
  Administered 2017-07-31: 3 mL via INTRAVENOUS

## 2017-07-30 MED ORDER — IRBESARTAN 300 MG PO TABS: 300.0000 mg | ORAL_TABLET | Freq: Every day | ORAL | Status: DC

## 2017-07-30 MED ORDER — ACETAMINOPHEN 325 MG PO TABS: 650.0000 mg | ORAL_TABLET | Freq: Four times a day (QID) | ORAL | Status: DC | PRN

## 2017-07-30 MED ORDER — INSULIN ASPART 100 UNIT/ML ~~LOC~~ SOLN: 0.0000 [IU] | Freq: Every day | SUBCUTANEOUS | Status: DC

## 2017-07-30 MED ORDER — AMLODIPINE BESYLATE 5 MG PO TABS
10.0000 mg | ORAL_TABLET | Freq: Every day | ORAL | Status: DC
  Administered 2017-07-31 – 2017-08-01 (×2): 10 mg via ORAL
  Filled 2017-07-30 (×3): qty 2

## 2017-07-30 MED ORDER — LACTATED RINGERS IV SOLN
INTRAVENOUS | Status: DC
  Administered 2017-07-30 – 2017-08-01 (×4): via INTRAVENOUS

## 2017-07-30 MED ORDER — ACETAMINOPHEN 650 MG RE SUPP
650.0000 mg | Freq: Four times a day (QID) | RECTAL | Status: DC | PRN
Start: 2017-07-30 — End: 2017-08-01

## 2017-07-30 MED ORDER — TAMSULOSIN HCL 0.4 MG PO CAPS
0.4000 mg | ORAL_CAPSULE | Freq: Every day | ORAL | Status: DC
  Administered 2017-07-31 – 2017-08-01 (×2): 0.4 mg via ORAL
  Filled 2017-07-30 (×2): qty 1

## 2017-07-30 MED ORDER — INSULIN GLARGINE 100 UNIT/ML ~~LOC~~ SOLN
50.0000 [IU] | Freq: Every day | SUBCUTANEOUS | Status: DC
  Administered 2017-07-30 – 2017-07-31 (×2): 50 [IU] via SUBCUTANEOUS
  Filled 2017-07-30 (×4): qty 0.5
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
## 2017-07-30 NOTE — ED Provider Notes (Addendum)
[facility] Provider Note   CSN: [id number] Arrival date & time: 07/30/17  1014     History   Chief Complaint Chief Complaint  Patient presents with  . Near Syncope    Pt had syncopal episode    HPI Geoffrey Jackson is a 51 y.o. male.  The patient states that he became dizzy at work and then passed out.He also gives a history of increasing shortness of breath with exertion   The history is provided by the patient.  Near Syncope  This is a new problem. The current episode started less than 1 hour ago. The problem occurs rarely. The problem has been resolved. Pertinent negatives include no chest pain, no abdominal pain and no headaches. Nothing aggravates the symptoms. Nothing relieves the symptoms. He has tried nothing for the symptoms. The treatment provided no relief.    Past Medical History:  Diagnosis Date  . Benign prostatic hypertrophy   . Essential hypertension   . GERD (gastroesophageal reflux disease)   . Hypertension   . Low back pain   . Noncompliance with medications   . Obesity   . OSA (obstructive sleep apnea)    Non-compliant with CPAP  . Type 2 diabetes mellitus [facility])     Patient Active Problem List   Diagnosis Date Noted  . Hypertensive urgency 12/17/2015  . Nausea, vomiting, and diarrhea 12/17/2015  . Headache 12/17/2015  . Hypertensive emergency 12/17/2015  . CAP (community acquired pneumonia) 06/15/2015  . Elevated troponin I level 06/15/2015  . Biceps tendonitis on right   . Tear of biceps muscle 10/25/2013  . Right shoulder pain 10/25/2013  . Chest pain 04/13/2013  . Diabetes mellitus type 2 in obese (HCC) 02/09/2007  . Hyperlipidemia 12/08/2006  . Obesity 12/08/2006  . Essential hypertension 12/08/2006  . GERD 12/08/2006  . BENIGN PROSTATIC HYPERTROPHY 12/08/2006    Past Surgical History:  Procedure Laterality Date  . BICEPT TENODESIS Right 11/10/2014   Procedure: BICEPS TENODESIS;  Surgeon: [name], MD;   Location: [facility];  Service: Orthopedics;  Laterality: Right;  biceps tendon  . SHOULDER ARTHROSCOPY    . SHOULDER ARTHROSCOPY  05/21/2012   Procedure: ARTHROSCOPY SHOULDER;  Surgeon: [name], MD;  Location: [facility];  Service: Orthopedics;  Laterality: Right;  . SPINE SURGERY     Dr Danielle Dess   . SUBACROMIAL DECOMPRESSION  05/21/2012   Procedure: SUBACROMIAL DECOMPRESSION;  Surgeon: [name], MD;  Location: [facility];  Service: Orthopedics;  Laterality: Right;       Home Medications    Prior to Admission medications   Medication Sig Start Date End Date Taking? Authorizing Provider  albuterol (PROVENTIL HFA;VENTOLIN HFA) 108 (90 BASE) MCG/ACT inhaler Inhale 2 puffs into the lungs every 4 (four) hours as needed for wheezing or shortness of breath. 06/21/15  Yes Idol, Raynelle Fanning, PA-C  amLODipine (NORVASC) 10 MG tablet Take 10 mg by mouth daily. 11/15/15  Yes [provider]  BENICAR 40 MG tablet Take 40 mg by mouth daily.  09/16/15  Yes [provider]  dapagliflozin propanediol (FARXIGA) 10 MG TABS tablet Take 10 mg by mouth daily.   Yes [provider]  ibuprofen (ADVIL,MOTRIN) 800 MG tablet TAKE ONE TABLET BY MOUTH EVERY 8 HOURS AS NEEDED 07/28/17  Yes Darreld Mclean, MD  Insulin Glargine (TOUJEO SOLOSTAR) 300 UNIT/ML SOPN Inject 50 Units into the skin at bedtime. 12/19/15  Yes Dondiego, Gerlene Burdock, MD  labetalol (NORMODYNE) 300 MG tablet Take 300 mg by  mouth 2 (two) times daily.   Yes [provider]  metFORMIN (GLUCOPHAGE) 500 MG tablet Take 500 mg by mouth 2 (two) times daily with a meal.    Yes [provider]  nitroGLYCERIN (NITROSTAT) 0.4 MG SL tablet Place 1 tablet (0.4 mg total) under the tongue every 5 (five) minutes as needed for chest pain. 06/19/15  Yes Oval Linsey, MD  pravastatin (PRAVACHOL) 40 MG tablet Take 40 mg by mouth daily.   Yes [provider]  tamsulosin (FLOMAX) 0.4 MG CAPS capsule Take 1 capsule by mouth  daily. 11/15/15  Yes [provider]  famotidine (PEPCID) 20 MG tablet Take 1 tablet (20 mg total) by mouth 2 (two) times daily. Patient not taking: Reported on 07/30/2017 06/21/15   Burgess Amor, PA-C  HYDROcodone-acetaminophen (NORCO/VICODIN) 5-325 MG tablet Take 1 tablet by mouth every 4 (four) hours as needed for moderate pain. Patient not taking: Reported on 07/30/2017 07/03/17   [name], MD  methocarbamol (ROBAXIN) 500 MG tablet Take 1 tablet (500 mg total) by mouth 4 (four) times daily. Patient not taking: Reported on 07/30/2017 05/15/17   [name], MD    Family History Family History  Problem Relation Age of Onset  . Diabetes Father   . Hypertension Father   . Heart failure Father   . Kidney disease Father   . Diabetes Sister   . Hypertension Sister   . Hypertension Mother     Social History Social History  Substance Use Topics  . Smoking status: Never Smoker  . Smokeless tobacco: Never Used  . Alcohol use 0.0 oz/week     Comment: Occasionally     Allergies   Demerol [meperidine] and Lansoprazole   Review of Systems Review of Systems  Constitutional: Negative for appetite change and fatigue.  HENT: Negative for congestion, ear discharge and sinus pressure.   Eyes: Negative for discharge.  Respiratory: Negative for cough.   Cardiovascular: Positive for near-syncope. Negative for chest pain.  Gastrointestinal: Negative for abdominal pain and diarrhea.  Genitourinary: Negative for frequency and hematuria.  Musculoskeletal: Negative for back pain.  Skin: Negative for rash.  Allergic/Immunologic: Negative for food allergies.  Neurological: Positive for syncope. Negative for seizures and headaches.  Psychiatric/Behavioral: Negative for hallucinations and self-injury.     Physical Exam Updated Vital Signs BP (!) 143/94   Pulse 67   Resp 13   Ht 6' (1.829 m)   Wt 115.7 kg (255 lb)   SpO2 99%   BMI 34.58 kg/m   Physical Exam    Constitutional: He is oriented to person, place, and time. He appears well-developed.  HENT:  Head: Normocephalic.  Eyes: Conjunctivae and EOM are normal. No scleral icterus.  Neck: Neck supple. No thyromegaly present.  Cardiovascular: Normal rate and regular rhythm.  Exam reveals no gallop and no friction rub.   No murmur heard. Pulmonary/Chest: No stridor. He has no wheezes. He has no rales. He exhibits no tenderness.  Abdominal: He exhibits no distension. There is no tenderness. There is no rebound.  Musculoskeletal: Normal range of motion. He exhibits no edema.  Lymphadenopathy:    He has no cervical adenopathy.  Neurological: He is oriented to person, place, and time. He exhibits normal muscle tone. Coordination normal.  Skin: No rash noted. No erythema.  Psychiatric: He has a normal mood and affect. His behavior is normal.  Nursing note reviewed.    ED Treatments / Results  Labs (all labs ordered are listed, but  only abnormal results are displayed) Labs Reviewed  COMPREHENSIVE METABOLIC PANEL - Abnormal; Notable for the following:       Result Value   Potassium 3.3 (*)    Glucose, Bld 154 (*)    Calcium 8.7 (*)    All other components within normal limits  TROPONIN I - Abnormal; Notable for the following:    Troponin I 0.03 (*)    All other components within normal limits  CBG MONITORING, ED - Abnormal; Notable for the following:    Glucose-Capillary 137 (*)    All other components within normal limits  CBC WITH DIFFERENTIAL/PLATELET    EKG  EKG Interpretation  Date/Time:  Thursday July 30 2017 10:18:01 EDT Ventricular Rate:  70 PR Interval:    QRS Duration: 101 QT Interval:  429 QTC Calculation: 463 R Axis:   10 Text Interpretation:  Sinus rhythm Confirmed by Bethann BerkshireZammit, Koren Plyler [phone]) on 07/30/2017 2:40:51 PM       Radiology Dg Chest 2 View  Result Date: 07/30/2017 CLINICAL DATA:  Chest pain EXAM: CHEST  2 VIEW COMPARISON:  12/17/2015 FINDINGS: Cardiac  enlargement without heart failure. Lungs are clear without infiltrate effusion or mass. Osteophytes in the thoracic spine. IMPRESSION: No active cardiopulmonary disease. Electronically Signed   By: Marlan Palauharles  Clark M.D.   On: 07/30/2017 11:50   Ct Head Wo Contrast  Result Date: 07/30/2017 CLINICAL DATA:  Syncope today with a fall and blow to the back of the head. Left neck pain. EXAM: CT HEAD WITHOUT CONTRAST CT CERVICAL SPINE WITHOUT CONTRAST TECHNIQUE: Multidetector CT imaging of the head and cervical spine was performed following the standard protocol without intravenous contrast. Multiplanar CT image reconstructions of the cervical spine were also generated. COMPARISON:  Head CT scan 12/17/2015. MRI cervical spine 10/01/2010. FINDINGS: CT HEAD FINDINGS Brain: Appears normal without hemorrhage, infarct, mass lesion, mass effect, midline shift or abnormal extra-axial fluid collection. No hydrocephalus or pneumocephalus. Vascular: Atherosclerosis noted. Skull: Intact. Sinuses/Orbits: Small mucous retention cysts or polyps are seen in the maxillary sinuses bilaterally. Other: None. CT CERVICAL SPINE FINDINGS Alignment: Maintained with straightening of lordosis noted. Skull base and vertebrae: No acute fracture. No primary bone lesion or focal pathologic process. Soft tissues and spinal canal: No prevertebral fluid or swelling. No visible canal hematoma. Disc levels: Mild loss of disc space height and endplate spurring are there seen at C4-5. Upper chest: Lung apices are clear. Other: None. IMPRESSION: No acute abnormality head or cervical spine. Atherosclerosis. 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 Electronically Signed   By: Drusilla Kannerhomas  Dalessio M.D.   On: 07/30/2017 12:15   Ct Cervical Spine Wo Contrast  Result Date: 07/30/2017 CLINICAL DATA:  Syncope today with a fall and blow to the back of the head. Left neck pain. EXAM: CT HEAD WITHOUT CONTRAST CT CERVICAL SPINE WITHOUT CONTRAST TECHNIQUE: Multidetector CT  imaging of the head and cervical spine was performed following the standard protocol without intravenous contrast. Multiplanar CT image reconstructions of the cervical spine were also generated. COMPARISON:  Head CT scan 12/17/2015. MRI cervical spine 10/01/2010. FINDINGS: CT HEAD FINDINGS Brain: Appears normal without hemorrhage, infarct, mass lesion, mass effect, midline shift or abnormal extra-axial fluid collection. No hydrocephalus or pneumocephalus. Vascular: Atherosclerosis noted. Skull: Intact. Sinuses/Orbits: Small mucous retention cysts or polyps are seen in the maxillary sinuses bilaterally. Other: None. CT CERVICAL SPINE FINDINGS Alignment: Maintained with straightening of lordosis noted. Skull base and vertebrae: No acute fracture. No primary bone lesion or focal pathologic process. Soft tissues and  spinal canal: No prevertebral fluid or swelling. No visible canal hematoma. Disc levels: Mild loss of disc space height and endplate spurring are there seen at C4-5. Upper chest: Lung apices are clear. Other: None. IMPRESSION: No acute abnormality head or cervical spine. Atherosclerosis. 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 Electronically Signed   By: Drusilla Kanner M.D.   On: 07/30/2017 12:15    Procedures Procedures (including critical care time)  Medications Ordered in ED Medications  sodium chloride 0.9 % bolus 1,000 mL (0 mLs Intravenous Stopped 07/30/17 1200)     Initial Impression / Assessment and Plan / ED Course  I have reviewed the triage vital signs and the nursing notes.  Pertinent labs & imaging results that were available during my care of the patient were reviewed by me and considered in my medical decision making (see chart for details).     Patient with syncope and borderline troponin. I spoke with the patient's primary care physician and it was his wish is to admit the patient and have cardiology consult tomorrow. His physician will see him tomorrow. Patient will be  admitted for syncope  Final Clinical Impressions(s) / ED Diagnoses   Final diagnoses:  Syncope and collapse    New Prescriptions New Prescriptions   No medications on file     Bethann Berkshire, MD 07/30/17 1546    Bethann Berkshire, MD 07/30/17 1547

## 2017-07-31 ENCOUNTER — Observation Stay (HOSPITAL_BASED_OUTPATIENT_CLINIC_OR_DEPARTMENT_OTHER): Payer: BC Managed Care – PPO

## 2017-07-31 DIAGNOSIS — R55 Syncope and collapse: Principal | ICD-10-CM

## 2017-07-31 LAB — TROPONIN I
TROPONIN I: 0.03 ng/mL — AB (ref <0.03)
Troponin I: <0.03 ng/mL (ref <0.03)

## 2017-07-31 LAB — BASIC METABOLIC PANEL
ANION GAP: 7 (ref 5–15)
BUN: 16 mg/dL (ref 6–20)
CALCIUM: 8.6 mg/dL — AB (ref 8.9–10.3)
CHLORIDE: 109 mmol/L (ref 101–111)
CO2: 26 mmol/L (ref 22–32)
Creatinine, Ser: 0.9 mg/dL (ref 0.61–1.24)
GFR calc Af Amer: >60 mL/min (ref >60)
GFR calc non Af Amer: >60 mL/min (ref >60)
GLUCOSE: 121 mg/dL — AB (ref 65–99)
Potassium: 3.1 mmol/L — ABNORMAL LOW (ref 3.5–5.1)
Sodium: 142 mmol/L (ref 135–145)

## 2017-07-31 LAB — ECHOCARDIOGRAM COMPLETE
AVLVOTPG: 3 mmHg
CHL CUP DOP CALC LVOT VTI: 16.4 cm
CHL CUP STROKE VOLUME: 68 mL
E/e' ratio: 7.71
EWDT: 151 ms
FS: 29 % (ref 28–44)
HEIGHTINCHES: 73 in
IV/PV OW: 1.05
LA ID, A-P, ES: 45 mm
LA diam end sys: 45 mm
LA diam index: 1.8 cm/m2
LA vol A4C: 83.3 ml
LDCA: 4.15 cm2
LV E/e' medial: 7.71
LV E/e'average: 7.71
LV SIMPSON'S DISK: 62
LV TDI E'LATERAL: 10.9
LV dias vol index: 44 mL/m2
LV dias vol: 111 mL (ref 62–150)
LV sys vol index: 17 mL/m2
LVELAT: 10.9 cm/s
LVOT diameter: 23 mm
LVOT peak vel: 80.9 cm/s
LVOTSV: 68 mL
LVSYSVOL: 43 mL
MV Dec: 151
MV pk A vel: 35.1 m/s
MV pk E vel: 84 m/s
MVPG: 3 mmHg
PW: 15 mm — AB (ref 0.6–1.1)
RV LATERAL S' VELOCITY: 12.8 cm/s
TAPSE: 24.3 mm
TDI e' medial: 8.16
WEIGHTICAEL: 4148.8 [oz_av]

## 2017-07-31 LAB — HEMOGLOBIN A1C
HEMOGLOBIN A1C: 8 % — AB (ref 4.8–5.6)
Mean Plasma Glucose: 182.9 mg/dL

## 2017-07-31 LAB — CBC
HCT: 43.1 % (ref 39.0–52.0)
HEMOGLOBIN: 14.2 g/dL (ref 13.0–17.0)
MCH: 30.2 pg (ref 26.0–34.0)
MCHC: 32.9 g/dL (ref 30.0–36.0)
MCV: 91.7 fL (ref 78.0–100.0)
Platelets: 193 10*3/uL (ref 150–400)
RBC: 4.7 MIL/uL (ref 4.22–5.81)
RDW: 13.5 % (ref 11.5–15.5)
WBC: 6 10*3/uL (ref 4.0–10.5)

## 2017-07-31 LAB — GLUCOSE, CAPILLARY
GLUCOSE-CAPILLARY: 105 mg/dL — AB (ref 65–99)
GLUCOSE-CAPILLARY: 184 mg/dL — AB (ref 65–99)
Glucose-Capillary: 110 mg/dL — ABNORMAL HIGH (ref 65–99)
Glucose-Capillary: 97 mg/dL (ref 65–99)
Glucose-Capillary: 155 mg/dL — ABNORMAL HIGH (ref 65–99)

## 2017-07-31 LAB — LIPID PANEL
CHOL/HDL RATIO: 3.3 ratio
Cholesterol: 117 mg/dL (ref 0–200)
HDL: 35 mg/dL — ABNORMAL LOW (ref >40)
LDL CALC: 61 mg/dL (ref 0–99)
TRIGLYCERIDES: 106 mg/dL (ref <150)
VLDL: 21 mg/dL (ref 0–40)

## 2017-07-31 MED ORDER — LABETALOL HCL 200 MG PO TABS
100.0000 mg | ORAL_TABLET | Freq: Two times a day (BID) | ORAL | Status: DC
  Administered 2017-07-31 – 2017-08-01 (×3): 100 mg via ORAL
  Filled 2017-07-31 (×3): qty 1

## 2017-07-31 MED ORDER — POTASSIUM CHLORIDE CRYS ER 20 MEQ PO TBCR
40.0000 meq | EXTENDED_RELEASE_TABLET | Freq: Once | ORAL | Status: AC
  Administered 2017-07-31: 40 meq via ORAL
  Filled 2017-07-31: qty 2

## 2017-07-31 MED ORDER — IRBESARTAN 150 MG PO TABS
150.0000 mg | ORAL_TABLET | Freq: Every day | ORAL | Status: DC
  Administered 2017-08-01: 150 mg via ORAL
  Filled 2017-07-31: qty 1

## 2017-07-31 NOTE — Progress Notes (Signed)
Pt refuses bed alarm or to call when getting up. Educated pt on safety precautions and standing slowly. Will continue to monitor.

## 2017-07-31 NOTE — Progress Notes (Signed)
*  PRELIMINARY RESULTS* Echocardiogram 2D Echocardiogram has been performed.  Stacey DrainWhite, Harlowe Dowler J 07/31/2017, 9:22 AM

## 2017-07-31 NOTE — Consult Note (Signed)
Cardiology Consultation:   Patient ID: Geoffrey Jackson; [id number]; 03/27/1966   Admit date: 07/30/2017 Date of Consult: 07/31/2017  Primary Care Provider: Oval Linsey, MD Primary Cardiologist: Geoffrey Jackson    Patient Profile:   Geoffrey Jackson is a 51 y.o. male with a hx of Hypertension, medical noncompliance, diabetes, hyperlipidemia, and circumflex to sleep apnea. who is being seen today for the evaluation of syncopal episode  at the request of Geoffrey, Geoffrey Jackson.   History of Present Illness:   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 He states that he's been feeling funny in his had ever since medication adjustment for blood pressure control. He was recently started on labetalol over the last few months as his blood pressure has not been well controlled. He states, that he has had some lightheadedness and dizziness and some itching since starting the medication.He has also complained of frequent diarrhea for the last several days.    He did report these symptoms to his physician who said that it was a mild side effect and not life-threatening. Therefore the patient has continued to take the labetalol along with amlodipine and rbesartan,  and . He states that he drinks a lot of water and states hydrated, but for some reason he became very lightheaded with a lot of blurred vision and passed out. When he came to after approximately one minute, (the event was witnessed by other teacher's) he was assisted to a seated position where he continued to feel "funny in my head" and dizzy while he awaited EMS.  On arrival to the ER, BP 67, O2 Sat 97% afebrile. Troponin 0.03. X 3, CXR did not reveal acute cardiopulmonary disease. Pertinent labs Potassium 3.1, Creatinine 0.90. EKG revealed NSR without acute ST T wave abnormalities. The patient continues to feel some lightheadedness. He did  have orthostatic blood pressures completed this a.m. Blood pressure 147/83 lying, 133/82 sitting, and 130/86 standing. Heart rate was stable. He is being continued on a lactated Ringer's at 75 cc an hour.  His main complaint now is neck and shoulder pain and pain around his ribs and lower back, likely from fall. He has been restarted on irbesartan 300 mg daily and amlodipine 10 mg daily. He has not been restarted on labetalol. Echocardiogram has been completed this a.m.   Past Medical History:  Diagnosis Date  . Benign prostatic hypertrophy   . Essential hypertension   . GERD (gastroesophageal reflux disease)   . Low back pain   . Noncompliance with medications   . Obesity   . OSA (obstructive sleep apnea)    Non-compliant with CPAP  . Type 2 diabetes mellitus (HCC)     Past Surgical History:  Procedure Laterality Date  . BICEPT TENODESIS Right 11/10/2014   Procedure: BICEPS TENODESIS;  Surgeon: Geoffrey Hearing, MD;  Location: [facility];  Service: Orthopedics;  Laterality: Right;  biceps tendon  . SHOULDER ARTHROSCOPY    . SHOULDER ARTHROSCOPY  05/21/2012   Procedure: ARTHROSCOPY SHOULDER;  Surgeon: Geoffrey Hearing, MD;  Location: [facility];  Service: Orthopedics;  Laterality: Right;  . SPINE SURGERY     Geoffrey Jackson   . SUBACROMIAL DECOMPRESSION  05/21/2012   Procedure: SUBACROMIAL DECOMPRESSION;  Surgeon: Geoffrey Hearing, MD;  Location: [facility];  Service: Orthopedics;  Laterality: Right;     Home Medications:  Prior to Admission medications   Medication  Sig Start Date End Date Taking? Authorizing Provider  albuterol (PROVENTIL HFA;VENTOLIN HFA) 108 (90 BASE) MCG/ACT inhaler Inhale 2 puffs into the lungs every 4 (four) hours as needed for wheezing or shortness of breath. 06/21/15  Yes Idol, Raynelle Fanning, PA-C  amLODipine (NORVASC) 10 MG tablet Take 10 mg by mouth daily. 11/15/15  Yes [provider]  BENICAR 40 MG tablet Take 40 mg by mouth daily.  09/16/15  Yes [provider]  dapagliflozin propanediol (FARXIGA) 10 MG TABS tablet Take 10 mg by mouth daily.   Yes [provider]  ibuprofen (ADVIL,MOTRIN) 800 MG tablet TAKE ONE TABLET BY MOUTH EVERY 8 HOURS AS NEEDED 07/28/17  Yes Geoffrey Mclean, MD  Insulin Glargine (TOUJEO SOLOSTAR) 300 UNIT/ML SOPN Inject 50 Units into the skin at bedtime. 12/19/15  Yes Geoffrey Linsey, MD  labetalol (NORMODYNE) 300 MG tablet Take 300 mg by mouth 2 (two) times daily.   Yes [provider]  metFORMIN (GLUCOPHAGE) 500 MG tablet Take 500 mg by mouth 2 (two) times daily with a meal.    Yes [provider]  nitroGLYCERIN (NITROSTAT) 0.4 MG SL tablet Place 1 tablet (0.4 mg total) under the tongue every 5 (five) minutes as needed for chest pain. 06/19/15  Yes Geoffrey Linsey, MD  pravastatin (PRAVACHOL) 40 MG tablet Take 40 mg by mouth daily.   Yes [provider]  tamsulosin (FLOMAX) 0.4 MG CAPS capsule Take 1 capsule by mouth daily. 11/15/15  Yes [provider]    Inpatient Medications: Scheduled Meds: . amLODipine  10 mg Oral Daily  . aspirin  81 mg Oral Daily  . enoxaparin (LOVENOX) injection  40 mg Subcutaneous Q24H  . insulin aspart  0-15 Units Subcutaneous TID WC  . insulin aspart  0-5 Units Subcutaneous QHS  . insulin glargine  50 Units Subcutaneous QHS  . irbesartan  300 mg Oral Daily  . pravastatin  40 mg Oral q1800  . sodium chloride flush  3 mL Intravenous Q12H  . tamsulosin  0.4 mg Oral Daily   Continuous Infusions: . lactated ringers 75 mL/hr at 07/31/17 0608   PRN Meds: acetaminophen **OR** acetaminophen, HYDROcodone-acetaminophen, morphine injection, ondansetron **OR** ondansetron (ZOFRAN) IV  Allergies:    Allergies  Allergen Reactions  . Demerol [Meperidine] Itching  . Lansoprazole Other (See Comments)    unknown    Social History:   Social History   Social History  . Marital status: Married    Spouse name: N/A  . Number of children: N/A   . Years of education: N/A   Occupational History  . Geologist, engineering    Social History Main Topics  . Smoking status: Never Smoker  . Smokeless tobacco: Never Used  . Alcohol use 0.0 oz/week     Comment: Occasionally  . Drug use: No  . Sexual activity: Not on file   Other Topics Concern  . Not on file   Social History Narrative  . No narrative on file    Family History:    Family History  Problem Relation Age of Onset  . Diabetes Father   . Hypertension Father   . Heart failure Father   . Kidney disease Father   . Diabetes Sister   . Hypertension Sister   . Hypertension Mother      ROS:  Please see the history of present illness.  ROS  All other ROS reviewed and negative.     Physical Exam/Data:   Vitals:   07/30/17 1831 07/30/17  2031 07/30/17 2054 07/31/17 0525  BP: (!) 142/86  (!) 159/92   Pulse: 72  76   Resp: Temp: 98.7 F (37.1 C)  98.1 F (36.7 C) 97.7 F (36.5 C)  TempSrc: Oral  Oral Oral  SpO2: 100% 95% 99%   Weight: 255 lb (115.7 kg)   259 lb 4.8 oz (117.6 kg)  Height:  (1.854 m)       Intake/Output Summary (Last 24 hours) at 07/31/17 1153 Last data filed at 07/31/17 0608  Gross per 24 hour  Intake           1947.5 ml  Output                0 ml  Net           1947.5 ml   Filed Weights   07/30/17 1015 07/30/17 1831 07/31/17 0525  Weight: 255 lb (115.7 kg) 255 lb (115.7 kg) 259 lb 4.8 oz (117.6 kg)   Body mass index is 34.21 kg/m.  General:  Well nourished, well developed, in no acute distress HEENT: normal Lymph: no adenopathy Neck: no JVD Endocrine:  No thryomegaly Vascular: No carotid bruits; FA pulses 2+ bilaterally without bruits  Cardiac:  normal S1, S2; RRR; no murmur  Lungs:  Clear to auscultation bilaterally, no wheezing, rhonchi or rales  Abd: soft, nontender, no hepatomegaly  Ext: no edema Musculoskeletal:  No deformities, BUE and BLE strength normal and equal. Soreness to palpation of the lower left  ribs and flank around to his back bilaterally.  Skin: warm and dry  Neuro:  CNs 2-12 intact, no focal abnormalities noted Psych:  Normal affect   EKG:  The EKG was personally reviewed and demonstrates: NSR no evidence of WPW.  Telemetry:  Telemetry was personally reviewed and demonstrates:  NSR  Relevant CV Studies: 1. NM Stress Test 04/14/2013 IMPRESSION: Borderline stress nuclear myocardial study revealing very good exercise tolerance without symptoms or electrocardiographic changes to suggest myocardial ischemia, mild left ventricular dilatation, borderline left ventricular dysfunction and normal myocardial perfusion except for borderline basilar anteroseptal hypoperfusion with a degree of reversibility. This likely reflects technical issues as described, but a small degree of anterior ischemia cannot be unequivocally excluded. Mildly hypertensive response limited to recovery. Other findings as noted.  Echocardiogram 06/15/2015 Left ventricle: The cavity size was normal. Wall thickness was increased in a pattern of moderate LVH. Systolic function was normal. The estimated ejection fraction was in the range of 55% to 60%. Wall motion was normal; there were no regional wall motion abnormalities. Left ventricular diastolic function parameters were normal. - Left atrium: The atrium was mildly dilated. - Atrial septum: No defect or patent foramen ovale was identified.  Laboratory Data:  Chemistry Recent Labs Lab 07/30/17 1048 07/31/17 0204  NA 141 142  K 3.3* 3.1*  CL 110 109  CO2 24 26  GLUCOSE 154* 121*  BUN 19 16  CREATININE 1.10 0.90  CALCIUM 8.7* 8.6*  GFRNONAA >60 >60  GFRAA >60 >60  ANIONGAP 7 7     Recent Labs Lab 07/30/17 1048  PROT 7.0  ALBUMIN 3.8  AST 28  ALT 28  ALKPHOS 55  BILITOT 0.8   Hematology Recent Labs Lab 07/30/17 1048 07/31/17 0204  WBC 4.4 6.0  RBC 4.59 4.70  HGB 13.8 14.2  HCT 42.0 43.1  MCV 91.5 91.7  MCH 30.1  30.2  MCHC 32.9 32.9  RDW 13.6 13.5  PLT 170 193  Cardiac Enzymes Recent Labs Lab 07/30/17 1048 07/30/17 1951 07/31/17 0204 07/31/17 0721  TROPONINI 0.03* 0.03* <0.03 0.03*   No results for input(s): TROPIPOC in the last 168 hours.    Radiology/Studies:  Dg Chest 2 View  Result Date: 07/30/2017 CLINICAL DATA:  Chest pain EXAM: CHEST  2 VIEW COMPARISON:  12/17/2015 FINDINGS: Cardiac enlargement without heart failure. Lungs are clear without infiltrate effusion or mass. Osteophytes in the thoracic spine. IMPRESSION: No active cardiopulmonary disease. Electronically Signed   By: Marlan Palauharles  Clark M.D.   On: 07/30/2017 11:50   Ct Head Wo Contrast  Result Date: 07/30/2017 CLINICAL DATA:  Syncope today with a fall and blow to the back of the head. Left neck pain. EXAM: CT HEAD WITHOUT CONTRAST CT CERVICAL SPINE WITHOUT CONTRAST TECHNIQUE: Multidetector CT imaging of the head and cervical spine was performed following the standard protocol without intravenous contrast. Multiplanar CT image reconstructions of the cervical spine were also generated. COMPARISON:  Head CT scan 12/17/2015. MRI cervical spine 10/01/2010. FINDINGS: CT HEAD FINDINGS Brain: Appears normal without hemorrhage, infarct, mass lesion, mass effect, midline shift or abnormal extra-axial fluid collection. No hydrocephalus or pneumocephalus. Vascular: Atherosclerosis noted. Skull: Intact. Sinuses/Orbits: Small mucous retention cysts or polyps are seen in the maxillary sinuses bilaterally. Other: None. CT CERVICAL SPINE FINDINGS Alignment: Maintained with straightening of lordosis noted. Skull base and vertebrae: No acute fracture. No primary bone lesion or focal pathologic process. Soft tissues and spinal canal: No prevertebral fluid or swelling. No visible canal hematoma. Disc levels: Mild loss of disc space height and endplate spurring are there seen at C4-5. Upper chest: Lung apices are clear. Other: None. IMPRESSION: No acute  abnormality head or cervical spine. Atherosclerosis. 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 Electronically Signed   By: Drusilla Kannerhomas  Dalessio M.D.   On: 07/30/2017 12:15   Ct Cervical Spine Wo Contrast  Result Date: 07/30/2017 CLINICAL DATA:  Syncope today with a fall and blow to the back of the head. Left neck pain. EXAM: CT HEAD WITHOUT CONTRAST CT CERVICAL SPINE WITHOUT CONTRAST TECHNIQUE: Multidetector CT imaging of the head and cervical spine was performed following the standard protocol without intravenous contrast. Multiplanar CT image reconstructions of the cervical spine were also generated. COMPARISON:  Head CT scan 12/17/2015. MRI cervical spine 10/01/2010. FINDINGS: CT HEAD FINDINGS Brain: Appears normal without hemorrhage, infarct, mass lesion, mass effect, midline shift or abnormal extra-axial fluid collection. No hydrocephalus or pneumocephalus. Vascular: Atherosclerosis noted. Skull: Intact. Sinuses/Orbits: Small mucous retention cysts or polyps are seen in the maxillary sinuses bilaterally. Other: None. CT CERVICAL SPINE FINDINGS Alignment: Maintained with straightening of lordosis noted. Skull base and vertebrae: No acute fracture. No primary bone lesion or focal pathologic process. Soft tissues and spinal canal: No prevertebral fluid or swelling. No visible canal hematoma. Disc levels: Mild loss of disc space height and endplate spurring are there seen at C4-5. Upper chest: Lung apices are clear. Other: None. IMPRESSION: No acute abnormality head or cervical spine. Atherosclerosis. 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 Electronically Signed   By: Drusilla Kannerhomas  Dalessio M.D.   On: 07/30/2017 12:15    Assessment and Plan:   1.Syncopal Episode: Likely orthostatic. He has been taking labetalol for the last couple of months and has not felt well on it. He has also been having a lot of diarrhea, but states he drinks a lot of water during the day. He is very physically active. He still feels "a little  funny" in his head but better.  2. Hypertension: Per patient he has been tried on several medications for BP control. He is now on irbesartan and amlodipine. Labetalol has been discontinued  BP is stable. Evidence of mild orthostatic BP;s with 15 mmHg drop today. Echocardiogram has been completed this am. Most recent echo in 2016 was normal.   3. Diabetes: Patient is [place]. From cardiac standpoint he can eat. No planned testing today that would require NPO status.  4. Hypokalemia: Possibly from frequent diarrhea. Will give replacement.     Signed, Joni Reining, NP  07/31/2017 11:53 AM   Attending note Patient seen and discussed with DNP Lyman Bishop, I agree with her documentation above. 51 yo male with history of HTN, medication noncompliance, OSA, DM2, HL admitted with syncope.  Orthostatic on admission, SBP 114-->98 and DBP 82 to 70 Hgb 13.8, plt 170, K 3.3, Cr 1.10 TSH 1.1  Trop 0.03-->0.03-->neg -->0.03--> CXR no acute process CT head no acute process EKG NSR Echo pending  Probable orthostatic syncope vs vasovagal. No evidence at this time of pure cardiogenic cause. F/u echo results.  Very mild flat troponin not consistent with ACS in setting of  HTN, no plans for ischemic testing at this time. Continue amlodopine and ibresartan for bp at this time. He reports adequate hydration but has had somewhat of an uneasy stomach and increased sweating over the last few days. The other possibility would be some component of diabetic autonomic insufficiecny. Continue aggressive hydration, follow up echo results, likely discharge tomorrow.    Dina Rich MD

## 2017-07-31 NOTE — Progress Notes (Signed)
Geoffrey FerriesStephen D Mould GNF:[id number] DOB: [birth year] DOA: 07/30/2017 PCP: Oval Linseyondiego, Jaques Mineer, MD   Physical Exam: Blood pressure (!) 147/74, pulse 66, temperature 97.7 F (36.5 C), temperature source Oral, resp. rate 18, height 6\' 1"  (1.854 m), weight 117.6 kg (259 lb 4.8 oz), SpO2 97 %. Lungs clear. No rales or rhonchi heart rate and rhythm no S3-S4 rub   Investigations:  No results found for this or any previous visit (from the past 240 hour(s)).   Basic Metabolic Panel:  Recent Labs  84/69/6208/05/11 1048 07/31/17 0204  NA 141 142  K 3.3* 3.1*  CL 110 109  CO2 24 26  GLUCOSE 154* 121*  BUN 19 16  CREATININE 1.10 0.90  CALCIUM 8.7* 8.6*   Liver Function Tests:  Recent Labs  07/30/17 1048  AST 28  ALT 28  ALKPHOS 55  BILITOT 0.8  PROT 7.0  ALBUMIN 3.8     CBC:  Recent Labs  07/30/17 1048 07/31/17 0204  WBC 4.4 6.0  NEUTROABS 2.3  --   HGB 13.8 14.2  HCT 42.0 43.1  MCV 91.5 91.7  PLT 170 193    Dg Chest 2 View  Result Date: 07/30/2017 CLINICAL DATA:  Chest pain EXAM: CHEST  2 VIEW COMPARISON:  12/17/2015 FINDINGS: Cardiac enlargement without heart failure. Lungs are clear without infiltrate effusion or mass. Osteophytes in the thoracic spine. IMPRESSION: No active cardiopulmonary disease. Electronically Signed   By: Marlan Palauharles  Clark M.D.   On: 07/30/2017 11:50   Ct Head Wo Contrast  Result Date: 07/30/2017 CLINICAL DATA:  Syncope today with a fall and blow to the back of the head. Left neck pain. EXAM: CT HEAD WITHOUT CONTRAST CT CERVICAL SPINE WITHOUT CONTRAST TECHNIQUE: Multidetector CT imaging of the head and cervical spine was performed following the standard protocol without intravenous contrast. Multiplanar CT image reconstructions of the cervical spine were also generated. COMPARISON:  Head CT scan 12/17/2015. MRI cervical spine 10/01/2010. FINDINGS: CT HEAD FINDINGS Brain: Appears normal without hemorrhage, infarct, mass lesion, mass effect, midline shift or abnormal  extra-axial fluid collection. No hydrocephalus or pneumocephalus. Vascular: Atherosclerosis noted. Skull: Intact. Sinuses/Orbits: Small mucous retention cysts or polyps are seen in the maxillary sinuses bilaterally. Other: None. CT CERVICAL SPINE FINDINGS Alignment: Maintained with straightening of lordosis noted. Skull base and vertebrae: No acute fracture. No primary bone lesion or focal pathologic process. Soft tissues and spinal canal: No prevertebral fluid or swelling. No visible canal hematoma. Disc levels: Mild loss of disc space height and endplate spurring are there seen at C4-5. Upper chest: Lung apices are clear. Other: None. IMPRESSION: No acute abnormality head or cervical spine. Atherosclerosis. 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 Electronically Signed   By: Drusilla Kannerhomas  Dalessio M.D.   On: 07/30/2017 12:15   Ct Cervical Spine Wo Contrast  Result Date: 07/30/2017 CLINICAL DATA:  Syncope today with a fall and blow to the back of the head. Left neck pain. EXAM: CT HEAD WITHOUT CONTRAST CT CERVICAL SPINE WITHOUT CONTRAST TECHNIQUE: Multidetector CT imaging of the head and cervical spine was performed following the standard protocol without intravenous contrast. Multiplanar CT image reconstructions of the cervical spine were also generated. COMPARISON:  Head CT scan 12/17/2015. MRI cervical spine 10/01/2010. FINDINGS: CT HEAD FINDINGS Brain: Appears normal without hemorrhage, infarct, mass lesion, mass effect, midline shift or abnormal extra-axial fluid collection. No hydrocephalus or pneumocephalus. Vascular: Atherosclerosis noted. Skull: Intact. Sinuses/Orbits: Small mucous retention cysts or polyps are seen in the maxillary sinuses bilaterally. Other: None. CT CERVICAL  SPINE FINDINGS Alignment: Maintained with straightening of lordosis noted. Skull base and vertebrae: No acute fracture. No primary bone lesion or focal pathologic process. Soft tissues and spinal canal: No prevertebral fluid or  swelling. No visible canal hematoma. Disc levels: Mild loss of disc space height and endplate spurring are there seen at C4-5. Upper chest: Lung apices are clear. Other: None. IMPRESSION: No acute abnormality head or cervical spine. Atherosclerosis. 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 Electronically Signed   By: Drusilla Kanner M.D.   On: 07/30/2017 12:15      Medications:   Impression:  Principal Problem:   Syncope Active Problems:   Diabetes mellitus type 2 in obese [facility])   Essential hypertension   Elevated troponin I level     Plan: Resume labetalol lower dose 100 mg by mouth twice a day decrease irbesartan 300-150 per day monitor hemodynamic changes in orthostatic changes and possible autonomic insufficiency from long-standing diabetes is a confounding variable.  Consultants: Cardiology   Procedures   Antibiotics:         Time spent: 30 minutes   LOS: 0 days   Shaquasia Caponigro M   07/31/2017, 1:56 PM

## 2017-08-01 LAB — HIV ANTIBODY (ROUTINE TESTING W REFLEX): HIV Screen 4th Generation wRfx: NONREACTIVE

## 2017-08-01 LAB — BASIC METABOLIC PANEL
ANION GAP: 8 (ref 5–15)
BUN: 14 mg/dL (ref 6–20)
CO2: 29 mmol/L (ref 22–32)
Calcium: 8.9 mg/dL (ref 8.9–10.3)
Chloride: 106 mmol/L (ref 101–111)
Creatinine, Ser: 0.95 mg/dL (ref 0.61–1.24)
GFR calc Af Amer: >60 mL/min (ref >60)
GFR calc non Af Amer: >60 mL/min (ref >60)
GLUCOSE: 103 mg/dL — AB (ref 65–99)
POTASSIUM: 3.2 mmol/L — AB (ref 3.5–5.1)
Sodium: 143 mmol/L (ref 135–145)

## 2017-08-01 LAB — GLUCOSE, CAPILLARY: GLUCOSE-CAPILLARY: 151 mg/dL — AB (ref 65–99)

## 2017-08-01 MED ORDER — POTASSIUM CHLORIDE CRYS ER 20 MEQ PO TBCR: 20.0000 meq | EXTENDED_RELEASE_TABLET | Freq: Every day | ORAL | Status: DC

## 2017-08-01 MED ORDER — POTASSIUM CHLORIDE CRYS ER 20 MEQ PO TBCR: 20.0000 meq | EXTENDED_RELEASE_TABLET | Freq: Every day | ORAL | 2 refills | Status: AC

## 2017-08-01 MED ORDER — LABETALOL HCL 200 MG PO TABS: 200.0000 mg | ORAL_TABLET | Freq: Two times a day (BID) | ORAL | 3 refills | Status: AC

## 2017-08-01 MED ORDER — LABETALOL HCL 200 MG PO TABS: 200.0000 mg | ORAL_TABLET | Freq: Two times a day (BID) | ORAL | Status: DC

## 2017-08-01 MED ORDER — ASPIRIN 81 MG PO CHEW: 81.0000 mg | CHEWABLE_TABLET | Freq: Every day | ORAL | 11 refills | Status: AC

## 2017-08-01 NOTE — Discharge Summary (Signed)
Physician Discharge Summary  Geoffrey Jackson ZOX:[id number] DOB: 1966/02/13 DOA: 07/30/2017  PCP: Oval Linsey, MD  Admit date: 07/30/2017 Discharge date: 08/01/2017   Recommendations for Outpatient Follow-up:  Take Benicar 40 mg per day to take labetalol reduced dosage 200 mg by mouth twice a day as well as Norvasc 10 mg per day for hypertension control he is likewise advised to take all prehospital admission medicines to control diabetes and high lipids he will then follow-up in the office in 1-2 weeks' time where he will have a functional evaluation in form of stress echocardiogram performed to see how hemodynamics responded blood pressure as well as any highly unlikely ischemic episodes or dysrhythmias  Discharge Diagnoses:  Principal Problem:   Syncope Active Problems:   Diabetes mellitus type 2 in obese [facility])   Essential hypertension   Elevated troponin I level   Discharge Condition: Good  Filed Weights   07/30/17 1831 07/31/17 0525 08/01/17 0503  Weight: 115.7 kg (255 lb) 117.6 kg (259 lb 4.8 oz) 117.4 kg (258 lb 12.8 oz)    History of present illness:  Patient is a 51 year old black male known height for intensive noncompliant hyperlipidemic insulin minute diabetic who was placed on labetalol and gradually titrated up for poor blood pressure control in the face of Norvasc and Benicar his blood pressure was 138/80 something in office one week prior to admission patient had a syncopal episode at school not sure why he had no antecedent chest discomfort was seen in Hospital admitted EKG was normal 2-D echocardiogram revealed no regional wall motion abnormalities although was LVH and left atrial dilatation and a form of 4.5 cm was urged to control blood pressure better due to the dilatation of the heart chambers patient seems to understand this  Hospital Course:  See history of present illness above  Procedures:    Consultations:  Cardiology  Discharge  Instructions  Discharge Instructions    Discharge instructions    Complete by:  As directed    Discharge patient    Complete by:  As directed    Discharge disposition:  01-Home or Self Care   Discharge patient date:  08/01/2017     Allergies as of 08/01/2017      Reactions   Demerol [meperidine] Itching   Lansoprazole Other (See Comments)   unknown      Medication List    STOP taking these medications   ibuprofen 800 MG tablet Commonly known as:  ADVIL,MOTRIN     TAKE these medications   albuterol 108 (90 Base) MCG/ACT inhaler Commonly known as:  PROVENTIL HFA;VENTOLIN HFA Inhale 2 puffs into the lungs every 4 (four) hours as needed for wheezing or shortness of breath.   amLODipine 10 MG tablet Commonly known as:  NORVASC Take 10 mg by mouth daily.   aspirin 81 MG chewable tablet Chew 1 tablet (81 mg total) by mouth daily.   BENICAR 40 MG tablet Generic drug:  olmesartan Take 40 mg by mouth daily.   FARXIGA 10 MG Tabs tablet Generic drug:  dapagliflozin propanediol Take 10 mg by mouth daily.   GLUCOPHAGE 500 MG tablet Generic drug:  metFORMIN Take 500 mg by mouth 2 (two) times daily with a meal.   Insulin Glargine 300 UNIT/ML Sopn Commonly known as:  TOUJEO SOLOSTAR Inject 50 Units into the skin at bedtime.   labetalol 200 MG tablet Commonly known as:  NORMODYNE Take 1 tablet (200 mg total) by mouth 2 (two) times daily. What  changed:  medication strength  how much to take   nitroGLYCERIN 0.4 MG SL tablet Commonly known as:  NITROSTAT Place 1 tablet (0.4 mg total) under the tongue every 5 (five) minutes as needed for chest pain.   potassium chloride SA 20 MEQ tablet Commonly known as:  K-DUR,KLOR-CON Take 1 tablet (20 mEq total) by mouth daily.   pravastatin 40 MG tablet Commonly known as:  PRAVACHOL Take 40 mg by mouth daily.   tamsulosin 0.4 MG Caps capsule Commonly known as:  FLOMAX Take 1 capsule by mouth daily.            Discharge  Care Instructions        Start     Ordered   08/02/17 0000  aspirin 81 MG chewable tablet  Daily     08/01/17 1018   08/01/17 0000  labetalol (NORMODYNE) 200 MG tablet  2 times daily     08/01/17 1018   08/01/17 0000  potassium chloride SA (K-DUR,KLOR-CON) 20 MEQ tablet  Daily     08/01/17 1018   08/01/17 0000  Discharge instructions     08/01/17 1018   08/01/17 0000  Discharge patient    Question Answer Comment  Discharge disposition 01-Home or Self Care   Discharge patient date 08/01/2017      08/01/17 1018     Allergies  Allergen Reactions  . Demerol [Meperidine] Itching  . Lansoprazole Other (See Comments)    unknown      The results of significant diagnostics from this hospitalization (including imaging, microbiology, ancillary and laboratory) are listed below for reference.    Significant Diagnostic Studies: Dg Chest 2 View  Result Date: 07/30/2017 CLINICAL DATA:  Chest pain EXAM: CHEST  2 VIEW COMPARISON:  12/17/2015 FINDINGS: Cardiac enlargement without heart failure. Lungs are clear without infiltrate effusion or mass. Osteophytes in the thoracic spine. IMPRESSION: No active cardiopulmonary disease. Electronically Signed   By: Marlan Palau M.D.   On: 07/30/2017 11:50   Ct Head Wo Contrast  Result Date: 07/30/2017 CLINICAL DATA:  Syncope today with a fall and blow to the back of the head. Left neck pain. EXAM: CT HEAD WITHOUT CONTRAST CT CERVICAL SPINE WITHOUT CONTRAST TECHNIQUE: Multidetector CT imaging of the head and cervical spine was performed following the standard protocol without intravenous contrast. Multiplanar CT image reconstructions of the cervical spine were also generated. COMPARISON:  Head CT scan 12/17/2015. MRI cervical spine 10/01/2010. FINDINGS: CT HEAD FINDINGS Brain: Appears normal without hemorrhage, infarct, mass lesion, mass effect, midline shift or abnormal extra-axial fluid collection. No hydrocephalus or pneumocephalus. Vascular:  Atherosclerosis noted. Skull: Intact. Sinuses/Orbits: Small mucous retention cysts or polyps are seen in the maxillary sinuses bilaterally. Other: None. CT CERVICAL SPINE FINDINGS Alignment: Maintained with straightening of lordosis noted. Skull base and vertebrae: No acute fracture. No primary bone lesion or focal pathologic process. Soft tissues and spinal canal: No prevertebral fluid or swelling. No visible canal hematoma. Disc levels: Mild loss of disc space height and endplate spurring are there seen at C4-5. Upper chest: Lung apices are clear. Other: None. IMPRESSION: No acute abnormality head or cervical spine. Atherosclerosis. 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 Electronically Signed   By: Drusilla Kanner M.D.   On: 07/30/2017 12:15   Ct Cervical Spine Wo Contrast  Result Date: 07/30/2017 CLINICAL DATA:  Syncope today with a fall and blow to the back of the head. Left neck pain. EXAM: CT HEAD WITHOUT CONTRAST CT CERVICAL SPINE WITHOUT CONTRAST TECHNIQUE: Multidetector  CT imaging of the head and cervical spine was performed following the standard protocol without intravenous contrast. Multiplanar CT image reconstructions of the cervical spine were also generated. COMPARISON:  Head CT scan 12/17/2015. MRI cervical spine 10/01/2010. FINDINGS: CT HEAD FINDINGS Brain: Appears normal without hemorrhage, infarct, mass lesion, mass effect, midline shift or abnormal extra-axial fluid collection. No hydrocephalus or pneumocephalus. Vascular: Atherosclerosis noted. Skull: Intact. Sinuses/Orbits: Small mucous retention cysts or polyps are seen in the maxillary sinuses bilaterally. Other: None. CT CERVICAL SPINE FINDINGS Alignment: Maintained with straightening of lordosis noted. Skull base and vertebrae: No acute fracture. No primary bone lesion or focal pathologic process. Soft tissues and spinal canal: No prevertebral fluid or swelling. No visible canal hematoma. Disc levels: Mild loss of disc space height  and endplate spurring are there seen at C4-5. Upper chest: Lung apices are clear. Other: None. IMPRESSION: No acute abnormality head or cervical spine. Atherosclerosis. 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 Electronically Signed   By: Drusilla Kannerhomas  Dalessio M.D.   On: 07/30/2017 12:15    Microbiology: No results found for this or any previous visit (from the past 240 hour(s)).   Labs: Basic Metabolic Panel:  Recent Labs Lab 07/30/17 1048 07/31/17 0204 08/01/17 0636  NA 141 142 143  K 3.3* 3.1* 3.2*  CL 110 109 106  CO2 24 26 29   GLUCOSE 154* 121* 103*  BUN 19 16 14   CREATININE 1.10 0.90 0.95  CALCIUM 8.7* 8.6* 8.9   Liver Function Tests:  Recent Labs Lab 07/30/17 1048  AST 28  ALT 28  ALKPHOS 55  BILITOT 0.8  PROT 7.0  ALBUMIN 3.8   No results for input(s): LIPASE, AMYLASE in the last 168 hours. No results for input(s): AMMONIA in the last 168 hours. CBC:  Recent Labs Lab 07/30/17 1048 07/31/17 0204  WBC 4.4 6.0  NEUTROABS 2.3  --   HGB 13.8 14.2  HCT 42.0 43.1  MCV 91.5 91.7  PLT 170 193   Cardiac Enzymes:  Recent Labs Lab 07/30/17 1048 07/30/17 1951 07/31/17 0204 07/31/17 0721  TROPONINI 0.03* 0.03* <0.03 0.03*   BNP: BNP (last 3 results) No results for input(s): BNP in the last 8760 hours.  ProBNP (last 3 results) No results for input(s): PROBNP in the last 8760 hours.  CBG:  Recent Labs Lab 07/31/17 0734 07/31/17 1100 07/31/17 1633 07/31/17 2041 08/01/17 0749  GLUCAP 97 105* 184* 155* 151*       Signed:  Miku Udall M  [facility] Pager: [phone] 08/01/2017, 10:20 AM

## 2017-08-01 NOTE — Progress Notes (Signed)
Nurse tech reported that patient came to nurses station and asked if he could walk his daughters to hospital entrance and that she told him he's not supposed to but he went anyway. Went to go get the patient to come back and he was on his way back up. Educated patient again on safety and told him he's not allowed to leave this floor for safety reasons and in the future security can walk his family out. Pt understands and agrees.

## 2017-08-01 NOTE — Progress Notes (Signed)
Patent alert and oriented. Vital signs are stable. Saline lock removed. No complaints of any distress. Discharge instructions & prescriptions given and discussed with the patient and family. MD present at bedside to also answer questions. Patient verbalized understanding of instructions. Patient left floor via wheelchair. Accompanied by spouse and nursing staff.

## 2017-08-20 ENCOUNTER — Telehealth: Payer: Self-pay | Admitting: Orthopedic Surgery

## 2017-08-20 NOTE — Telephone Encounter (Signed)
Patient requests refill on Hydrocodone/Acetaminophen  5-325  Mgs.  Qty  42        Sig: Take 1 tablet by mouth every 8 (eight) hours as needed.

## 2017-08-20 NOTE — Telephone Encounter (Signed)
Patient requests refill on Ibuprofen  800 mgs.    Qty  90  Refill 5    Sig: Take 1 tablet (800 mg total) by mouth every 8 (eight) hours as needed for pain.equests refill on Ibuprofen 800  Mgs.

## 2017-08-21 ENCOUNTER — Other Ambulatory Visit: Payer: Self-pay | Admitting: Orthopedic Surgery

## 2017-08-21 DIAGNOSIS — M75101 Unspecified rotator cuff tear or rupture of right shoulder, not specified as traumatic: Secondary | ICD-10-CM

## 2017-08-21 MED ORDER — HYDROCODONE-ACETAMINOPHEN 5-325 MG PO TABS: 1.0000 | ORAL_TABLET | Freq: Four times a day (QID) | ORAL | 0 refills | Status: DC | PRN

## 2017-08-21 MED ORDER — IBUPROFEN 800 MG PO TABS: 800.0000 mg | ORAL_TABLET | Freq: Three times a day (TID) | ORAL | 5 refills | Status: AC | PRN

## 2017-09-25 ENCOUNTER — Telehealth: Payer: Self-pay | Admitting: Orthopedic Surgery

## 2017-09-25 NOTE — Telephone Encounter (Signed)
Patient called, states he also tried to reach Dr Romeo AppleHarrison another way; asking if Dr Romeo AppleHarrison can return his call - has a couple of questions.  Ph [phone].  Patient aware Dr is in clinic only 1/2 day on Fridays.

## 2017-10-05 ENCOUNTER — Telehealth: Payer: Self-pay | Admitting: Orthopedic Surgery

## 2017-10-05 NOTE — Telephone Encounter (Signed)
Patient requests refill on Hydrocodone/Acetaminophen 5-325  Mgs.   Qty  30  Sig: Take 1 tablet by mouth every 6 (six) hours as needed for moderate pain.

## 2017-10-06 ENCOUNTER — Other Ambulatory Visit: Payer: Self-pay | Admitting: Orthopedic Surgery

## 2017-10-06 MED ORDER — HYDROCODONE-ACETAMINOPHEN 5-325 MG PO TABS: 1.0000 | ORAL_TABLET | Freq: Four times a day (QID) | ORAL | 0 refills | Status: DC | PRN

## 2017-10-06 NOTE — Telephone Encounter (Signed)
ok 

## 2017-11-04 ENCOUNTER — Other Ambulatory Visit: Payer: Self-pay | Admitting: Orthopedic Surgery

## 2017-11-04 ENCOUNTER — Telehealth: Payer: Self-pay | Admitting: Orthopedic Surgery

## 2017-11-04 MED ORDER — HYDROCODONE-ACETAMINOPHEN 5-325 MG PO TABS: 1.0000 | ORAL_TABLET | Freq: Four times a day (QID) | ORAL | 0 refills | Status: DC | PRN

## 2017-11-04 NOTE — Telephone Encounter (Signed)
Patient called to request medication refill: [facility], [place]) HYDROcodone-acetaminophen (NORCO/VICODIN) 5-325 MG tablet 30 tablet   - patient was last seen for a visit 05/15/17      - please advise.

## 2017-11-04 NOTE — Telephone Encounter (Signed)
Called back to patient; notified. °

## 2017-11-04 NOTE — Telephone Encounter (Signed)
[place]

## 2017-12-02 ENCOUNTER — Telehealth: Payer: Self-pay | Admitting: Orthopedic Surgery

## 2017-12-02 NOTE — Telephone Encounter (Signed)
Patient called for refill,  HYDROcodone-acetaminophen (NORCO) 5-325 MG tablet 30 tablet  - we have also scheduled a follow up appointment, as patient had last been seen 05/15/17. - patient's pharmacy is [facility], [place]

## 2017-12-03 ENCOUNTER — Other Ambulatory Visit: Payer: Self-pay | Admitting: Orthopedic Surgery

## 2017-12-03 MED ORDER — HYDROCODONE-ACETAMINOPHEN 5-325 MG PO TABS: 1.0000 | ORAL_TABLET | Freq: Four times a day (QID) | ORAL | 0 refills | Status: DC | PRN

## 2017-12-03 NOTE — Progress Notes (Signed)
280  Sedative  120  Stimulant  000  Explanation and Guidance  OVERDOSE RISK SCORE  160  (Range 000-999)  Explanation and Guidance  ADDITIONAL RISK INDICATORS (0)  Explanation and Guidance

## 2017-12-21 ENCOUNTER — Encounter: Payer: Self-pay | Admitting: Orthopedic Surgery

## 2017-12-21 ENCOUNTER — Ambulatory Visit: Payer: BC Managed Care – PPO | Admitting: Orthopedic Surgery

## 2017-12-21 VITALS — BP 177/109 | HR 86 | Ht 72.0 in | Wt 250.0 lb

## 2017-12-21 DIAGNOSIS — M25511 Pain in right shoulder: Secondary | ICD-10-CM | POA: Diagnosis not present

## 2017-12-21 DIAGNOSIS — M7711 Lateral epicondylitis, right elbow: Secondary | ICD-10-CM | POA: Diagnosis not present

## 2017-12-21 DIAGNOSIS — R52 Pain, unspecified: Secondary | ICD-10-CM

## 2017-12-21 DIAGNOSIS — M65321 Trigger finger, right index finger: Secondary | ICD-10-CM | POA: Diagnosis not present

## 2017-12-21 MED ORDER — HYDROCODONE-ACETAMINOPHEN 5-325 MG PO TABS: 1.0000 | ORAL_TABLET | Freq: Four times a day (QID) | ORAL | 0 refills | Status: DC | PRN

## 2017-12-21 NOTE — Progress Notes (Signed)
Progress Note   Patient ID: Geoffrey Jackson, male   DOB: 28-Dec-1965, 52 y.o.   MRN: [id number]  Chief Complaint  Patient presents with  . Shoulder Pain    states Right shoulder painful wants stronger pain meds    52 year old male status post multiple surgeries on the right shoulder including a biceps tenodesis shoulder arthroscopy rotator cuff repair presents with continued right arm pain, he is on hydrocodone 5 mg 325, Q6 hrs      Review of Systems  Constitutional: Negative for chills and fever.  Musculoskeletal: Positive for back pain and neck pain.  Skin: Negative.   Neurological: Negative for tingling and sensory change.    Past Medical History:  Diagnosis Date  . Benign prostatic hypertrophy   . Essential hypertension   . GERD (gastroesophageal reflux disease)   . Low back pain   . Noncompliance with medications   . Obesity   . OSA (obstructive sleep apnea)    Non-compliant with CPAP  . Type 2 diabetes mellitus (HCC)     No outpatient medications have been marked as taking for the 12/21/17 encounter (Appointment) with [name], MD.    Allergies  Allergen Reactions  . Demerol [Meperidine] Itching  . Lansoprazole Other (See Comments)    unknown    Current Outpatient Medications:  .  albuterol (PROVENTIL HFA;VENTOLIN HFA) 108 (90 BASE) MCG/ACT inhaler, Inhale 2 puffs into the lungs every 4 (four) hours as needed for wheezing or shortness of breath., Disp: 1 Inhaler, Rfl: 0 .  amLODipine (NORVASC) 10 MG tablet, Take 10 mg by mouth daily., Disp: , Rfl:  .  aspirin 81 MG chewable tablet, Chew 1 tablet (81 mg total) by mouth daily., Disp: 30 tablet, Rfl: 11 .  dapagliflozin propanediol (FARXIGA) 10 MG TABS tablet, Take 10 mg by mouth daily., Disp: , Rfl:  .  HYDROcodone-acetaminophen (NORCO) 5-325 MG tablet, Take 1 tablet by mouth every 6 (six) hours as needed for moderate pain., Disp: 30 tablet, Rfl: 0 .  labetalol (NORMODYNE) 200 MG tablet, Take 1 tablet  (200 mg total) by mouth 2 (two) times daily., Disp: 60 tablet, Rfl: 3 .  metFORMIN (GLUCOPHAGE) 500 MG tablet, Take 500 mg by mouth 2 (two) times daily with a meal. , Disp: , Rfl:  .  nitroGLYCERIN (NITROSTAT) 0.4 MG SL tablet, Place 1 tablet (0.4 mg total) under the tongue every 5 (five) minutes as needed for chest pain., Disp: 30 tablet, Rfl: 12 .  potassium chloride SA (K-DUR,KLOR-CON) 20 MEQ tablet, Take 1 tablet (20 mEq total) by mouth daily., Disp: 30 tablet, Rfl: 2 .  pravastatin (PRAVACHOL) 40 MG tablet, Take 40 mg by mouth daily., Disp: , Rfl:  .  tamsulosin (FLOMAX) 0.4 MG CAPS capsule, Take 1 capsule by mouth daily., Disp: , Rfl:  .  BENICAR 40 MG tablet, Take 40 mg by mouth daily. , Disp: , Rfl:  .  Insulin Glargine (TOUJEO SOLOSTAR) 300 UNIT/ML SOPN, Inject 50 Units into the skin at bedtime. (Patient not taking: Reported on 12/21/2017), Disp: 300 mL, Rfl: 11 .  LEVEMIR FLEXTOUCH 100 UNIT/ML Pen, , Disp: , Rfl:    BP (!) 177/109   Pulse 86   Ht 6' (1.829 m)   Wt 250 lb (113.4 kg)   BMI 33.91 kg/m   Physical Exam  Constitutional: He is oriented to person, place, and time. He appears well-developed and well-nourished.  Vital signs have been reviewed and are stable. Gen. appearance the patient is  well-developed and well-nourished with normal grooming and hygiene.   Musculoskeletal:  GAIT IS normal   Neurological: He is alert and oriented to person, place, and time.  Skin: Skin is warm and dry. No erythema.  Psychiatric: He has a normal mood and affect.  Vitals reviewed.  Upper extremity  Tenderness over lateral epicondyle full range of motion elbow joint is stable to varus valgus stress testing muscle tone and strength normal skin intact no rash no ulceration no lesion no ulcer pulse and temperature normal lymph nodes negative sensation normal  Right hand index finger tenderness over the A1 pulley decreased flexion extension no evidence of injury to the flexor extensor  tendons  Right shoulder full range of motion but painful range of motion and crepitance on range of motion  Medical decision-making Encounter Diagnoses  Name Primary?  . Right tennis elbow Yes  . Right shoulder pain, unspecified chronicity   . Pain management   . Trigger index finger of right hand     Procedure note injection for right tennis elbow   Diagnosis right tennis elbow  Anesthesia ethyl chloride was used Alcohol use is clean the skin  After we obtained verbal consent and timeout a 25-gauge needle was used to inject 40 mg of Depo-Medrol and 3 cc of 1% lidocaine just distal to the insertion of the ECRB  There were no complications and a sterile bandage was applied.   Trigger finger injection  Diagnosis right index finger tenosynovitis Procedure injection A1 pulley Medications lidocaine 1% 1 mL and Depo-Medrol 40 mg 1 mL Skin prep alcohol and ethyl chloride Verbal consent was obtained Timeout confirmed the injection site  After cleaning the skin with alcohol and anesthetizing the skin with ethyl chloride the A1 pulley was palpated and the injection was performed without complication  Fuller CanadaStanley Harrison, MD 12/21/2017 9:40 AM

## 2018-03-24 ENCOUNTER — Ambulatory Visit: Payer: Self-pay | Admitting: Orthopedic Surgery

## 2018-04-05 ENCOUNTER — Ambulatory Visit: Payer: Self-pay | Admitting: Orthopedic Surgery

## 2018-05-03 ENCOUNTER — Ambulatory Visit: Payer: BC Managed Care – PPO | Admitting: Orthopedic Surgery

## 2018-05-03 ENCOUNTER — Encounter: Payer: Self-pay | Admitting: Orthopedic Surgery

## 2018-05-03 ENCOUNTER — Other Ambulatory Visit: Payer: Self-pay | Admitting: Orthopedic Surgery

## 2018-05-03 VITALS — BP 185/110 | HR 76 | Ht 72.0 in

## 2018-05-03 DIAGNOSIS — M25511 Pain in right shoulder: Secondary | ICD-10-CM

## 2018-05-03 DIAGNOSIS — M7711 Lateral epicondylitis, right elbow: Secondary | ICD-10-CM

## 2018-05-03 DIAGNOSIS — M65321 Trigger finger, right index finger: Secondary | ICD-10-CM | POA: Diagnosis not present

## 2018-05-03 DIAGNOSIS — R52 Pain, unspecified: Secondary | ICD-10-CM

## 2018-05-03 NOTE — Progress Notes (Signed)
Progress Note   Patient ID: Geoffrey Jackson, male   DOB: 1965/12/29, 52 y.o.   MRN: [id number]  Chief Complaint  Patient presents with  . Hand Pain    trigger finger right index     Medical decision-making Encounter Diagnosis  Name Primary?  . Trigger index finger of right hand Yes     PLAN:  Inject right index finger  Trigger finger injection  Diagnosis tenosynovitis right index finger Procedure injection A1 pulley Medications lidocaine 1% 1 mL and Depo-Medrol 40 mg 1 mL Skin prep alcohol and ethyl chloride Verbal consent was obtained Timeout confirmed the injection site  After cleaning the skin with alcohol and anesthetizing the skin with ethyl chloride the A1 pulley was palpated and the injection was performed without complication   Follow-up in 6 weeks  Chief Complaint  Patient presents with  . Hand Pain    trigger finger right index    52 YO MALE returns for evaluation of his right index finger triggering he status post a couple of injections with continued pain over the A1 pulley  He is considering surgery.    Review of Systems  Musculoskeletal: Positive for joint pain.       Complains of pain at the metacarpal phalangeal joint  Neurological: Negative for sensory change.   Current Meds  Medication Sig  . albuterol (PROVENTIL HFA;VENTOLIN HFA) 108 (90 BASE) MCG/ACT inhaler Inhale 2 puffs into the lungs every 4 (four) hours as needed for wheezing or shortness of breath.  Marland Kitchen. amLODipine (NORVASC) 10 MG tablet Take 10 mg by mouth daily.  Marland Kitchen. aspirin 81 MG chewable tablet Chew 1 tablet (81 mg total) by mouth daily.  . dapagliflozin propanediol (FARXIGA) 10 MG TABS tablet Take 10 mg by mouth daily.  Marland Kitchen. HYDROcodone-acetaminophen (NORCO) 5-325 MG tablet Take 1 tablet by mouth every 6 (six) hours as needed for moderate pain.  Marland Kitchen. labetalol (NORMODYNE) 200 MG tablet Take 1 tablet (200 mg total) by mouth 2 (two) times daily.  Marland Kitchen. LEVEMIR FLEXTOUCH 100 UNIT/ML Pen   .  metFORMIN (GLUCOPHAGE) 500 MG tablet Take 500 mg by mouth 2 (two) times daily with a meal.   . nitroGLYCERIN (NITROSTAT) 0.4 MG SL tablet Place 1 tablet (0.4 mg total) under the tongue every 5 (five) minutes as needed for chest pain.  . potassium chloride SA (K-DUR,KLOR-CON) 20 MEQ tablet Take 1 tablet (20 mEq total) by mouth daily.  . pravastatin (PRAVACHOL) 40 MG tablet Take 40 mg by mouth daily.  . tamsulosin (FLOMAX) 0.4 MG CAPS capsule Take 1 capsule by mouth daily.    Allergies  Allergen Reactions  . Demerol [Meperidine] Itching  . Lansoprazole Other (See Comments)    unknown     BP (!) 185/110   Pulse 76   Ht 6' (1.829 m)   BMI 33.91 kg/m   Physical Exam  Constitutional: He is oriented to person, place, and time. He appears well-developed and well-nourished.  Vital signs have been reviewed and are stable. Gen. appearance the patient is well-developed and well-nourished with normal grooming and hygiene.   Musculoskeletal:       Arms: Neurological: He is alert and oriented to person, place, and time.  Skin: Skin is warm and dry. No erythema.  Psychiatric: He has a normal mood and affect.  Vitals reviewed.      Fuller CanadaStanley Suanne Minahan, MD 05/03/2018 10:10 AM

## 2018-05-03 NOTE — Telephone Encounter (Signed)
Patient came by office, states forgot while he was here for office visit today, to request refill on medication: HYDROcodone-acetaminophen (NORCO) 5-325 MG tablet 30 tablet  - [facility], [place]

## 2018-05-04 MED ORDER — HYDROCODONE-ACETAMINOPHEN 5-325 MG PO TABS: 1.0000 | ORAL_TABLET | Freq: Four times a day (QID) | ORAL | 0 refills | Status: AC | PRN

## 2018-06-14 ENCOUNTER — Ambulatory Visit: Payer: BC Managed Care – PPO | Admitting: Orthopedic Surgery

## 2018-07-14 ENCOUNTER — Encounter: Payer: Self-pay | Admitting: Orthopedic Surgery

## 2018-07-14 ENCOUNTER — Ambulatory Visit: Payer: BC Managed Care – PPO | Admitting: Orthopedic Surgery

## 2018-07-14 VITALS — BP 176/93 | HR 77 | Ht 73.0 in | Wt 265.0 lb

## 2018-07-14 DIAGNOSIS — R52 Pain, unspecified: Secondary | ICD-10-CM

## 2018-07-14 DIAGNOSIS — M65321 Trigger finger, right index finger: Secondary | ICD-10-CM | POA: Diagnosis not present

## 2018-07-14 MED ORDER — IBUPROFEN 800 MG PO TABS: 800.0000 mg | ORAL_TABLET | Freq: Three times a day (TID) | ORAL | 5 refills | Status: AC | PRN

## 2018-07-14 MED ORDER — HYDROCODONE-ACETAMINOPHEN 5-325 MG PO TABS: 1.0000 | ORAL_TABLET | ORAL | 0 refills | Status: AC | PRN

## 2018-07-14 NOTE — Progress Notes (Signed)
Progress Note   Patient ID: Geoffrey Jackson, male   DOB: 01/02/1966, 52 y.o.   MRN: [id number]   Chief Complaint  Patient presents with  . Hand Pain    right/ better     HPI 52 year old male presents for routine follow-up regarding his right index finger for triggering.  He is not having any triggering phenomenon at this time he does have pain over the A1 pulley in the palm  ROS Neck pain chronic right shoulder pain status post 3 shoulder surgeries including arthroscopy right shoulder Arthroscopy right shoulder with cuff repair Arthroscopy right shoulder with open biceps tenodesis  Pain currently relatively well controlled with hydrocodone 5 mg every 4 and ibuprofen 800 mg every 8  Allergies  Allergen Reactions  . Demerol [Meperidine] Itching  . Lansoprazole Other (See Comments)    unknown     BP (!) 176/93   Pulse 77   Ht 6\' 1"  (1.854 m)   Wt 265 lb (120.2 kg)   BMI 34.96 kg/m   Physical Exam   Medical decisions:   Data  Imaging:   no  Encounter Diagnoses  Name Primary?  . Pain management Yes  . Trigger index finger of right hand     PLAN:   200 Sedative 130 Stimulant 000 Explanation and Guidance OVERDOSE RISK SCORE   230  (Range 000-999)  Medications were refilled Meds ordered this encounter  Medications  . HYDROcodone-acetaminophen (NORCO/VICODIN) 5-325 MG tablet    Sig: Take 1 tablet by mouth every 4 (four) hours as needed for up to 7 days for moderate pain.    Dispense:  42 tablet    Refill:  0  . ibuprofen (ADVIL,MOTRIN) 800 MG tablet    Sig: Take 1 tablet (800 mg total) by mouth every 8 (eight) hours as needed.    Dispense:  30 tablet    Refill:  5    He will follow-up in 2 months if he gets any locking episodes he will come in sooner consider surgery when he can get time off from work  Fuller CanadaStanley Travor Royce, MD 07/14/2018 9:34 AM

## 2018-09-01 ENCOUNTER — Other Ambulatory Visit: Payer: Self-pay | Admitting: Orthopedic Surgery

## 2018-09-01 MED ORDER — HYDROCODONE-ACETAMINOPHEN 5-325 MG PO TABS: 1.0000 | ORAL_TABLET | ORAL | 0 refills | Status: DC | PRN

## 2018-09-01 NOTE — Telephone Encounter (Signed)
Patient requests refill on Hydrocodone/Acetaminophen 5-325 mgs.   Qty  42  Sig: Take 1 tablet by mouth every 4 (four) hours as needed for up to 7 days for moderate pain  Patient uses [facility] in [place]

## 2018-09-02 ENCOUNTER — Encounter (HOSPITAL_COMMUNITY): Payer: Self-pay

## 2018-09-02 ENCOUNTER — Other Ambulatory Visit: Payer: Self-pay

## 2018-09-02 ENCOUNTER — Emergency Department (HOSPITAL_COMMUNITY)
Admission: EM | Admit: 2018-09-02 | Discharge: 2018-09-02 | Disposition: A | Discharge status: Home / Self Care | Payer: BC Managed Care – PPO | Attending: Emergency Medicine | Admitting: Emergency Medicine

## 2018-09-02 ENCOUNTER — Emergency Department (HOSPITAL_COMMUNITY): Payer: BC Managed Care – PPO

## 2018-09-02 DIAGNOSIS — Z7984 Long term (current) use of oral hypoglycemic drugs: Secondary | ICD-10-CM | POA: Diagnosis not present

## 2018-09-02 DIAGNOSIS — E119 Type 2 diabetes mellitus without complications: Secondary | ICD-10-CM | POA: Insufficient documentation

## 2018-09-02 DIAGNOSIS — M5412 Radiculopathy, cervical region: Secondary | ICD-10-CM | POA: Diagnosis not present

## 2018-09-02 DIAGNOSIS — Z7982 Long term (current) use of aspirin: Secondary | ICD-10-CM | POA: Diagnosis not present

## 2018-09-02 DIAGNOSIS — Z79899 Other long term (current) drug therapy: Secondary | ICD-10-CM | POA: Insufficient documentation

## 2018-09-02 DIAGNOSIS — I10 Essential (primary) hypertension: Secondary | ICD-10-CM | POA: Diagnosis not present

## 2018-09-02 DIAGNOSIS — M546 Pain in thoracic spine: Secondary | ICD-10-CM | POA: Diagnosis present

## 2018-09-02 MED ORDER — OXYCODONE-ACETAMINOPHEN 5-325 MG PO TABS: 1.0000 | ORAL_TABLET | Freq: Four times a day (QID) | ORAL | 0 refills | Status: DC | PRN

## 2018-09-02 MED ORDER — HYDROMORPHONE HCL 1 MG/ML IJ SOLN
INTRAMUSCULAR | Status: AC
  Filled 2018-09-02: qty 1

## 2018-09-02 MED ORDER — HYDROCODONE-ACETAMINOPHEN 5-325 MG PO TABS
1.0000 | ORAL_TABLET | Freq: Once | ORAL | Status: AC
  Administered 2018-09-02: 1 via ORAL
  Filled 2018-09-02: qty 1

## 2018-09-02 MED ORDER — HYDROMORPHONE HCL 1 MG/ML IJ SOLN
1.0000 mg | Freq: Once | INTRAMUSCULAR | Status: AC
  Administered 2018-09-02: 1 mg via INTRAMUSCULAR
  Filled 2018-09-02: qty 1

## 2018-09-02 MED ORDER — CELECOXIB 100 MG PO CAPS: 100.0000 mg | ORAL_CAPSULE | Freq: Two times a day (BID) | ORAL | 0 refills | Status: DC

## 2018-09-02 NOTE — Discharge Instructions (Addendum)
Follow-up with your primary care doctor next week for recheck. 

## 2018-09-02 NOTE — ED Notes (Signed)
Wasted 1 mg Dilaudid with Maralyn Sago, Charity fundraiser. Drew up for IV injection, but order was for IM.

## 2018-09-02 NOTE — ED Triage Notes (Signed)
Pt is feeling numbness and tingling on right side of arm. Is feeling pain in his back as well. Took BP while at work and had a systolic of 190. Able to move all extremities. No slurred speech. Alert and oriented. Takes BP medicine and has taken it today.

## 2018-09-03 ENCOUNTER — Ambulatory Visit: Payer: BC Managed Care – PPO | Admitting: Orthopedic Surgery

## 2018-09-03 VITALS — BP 160/110 | HR 71 | Ht 73.0 in | Wt 265.0 lb

## 2018-09-03 DIAGNOSIS — M4802 Spinal stenosis, cervical region: Secondary | ICD-10-CM

## 2018-09-03 MED ORDER — PREDNISONE 10 MG (48) PO TBPK: ORAL_TABLET | Freq: Every day | ORAL | 1 refills | Status: DC

## 2018-09-03 MED ORDER — METHOCARBAMOL 500 MG PO TABS: 500.0000 mg | ORAL_TABLET | Freq: Three times a day (TID) | ORAL | 1 refills | Status: DC

## 2018-09-03 NOTE — Progress Notes (Signed)
NEW PROBLEM  Chief Complaint  Patient presents with  . Neck Pain    ER follow up on neck pain, no injury.    52 YO AA MALE S/P  of lumbar surgery/thoracic surgery by Dr. Franky Macho about 15 years ago presents with acute onset of pain cervical spine right shoulder radiating right arm numbness and tingling as well as discomfort in his lower back.  He went to the ER yesterday was prescribed Percocet and Celebrex started that last night he says he is not gotten any real relief his neck is stiff he has trouble bending it he has a muscle spasm in his right side of his cervical spine preventing adequate range of motion symptoms present for several weeks got worse yesterday prompting ER visit. quality of pain dull ache burning   Review of Systems  Constitutional: Negative for malaise/fatigue and weight loss.  Neurological: Positive for tingling and sensory change. Negative for focal weakness and weakness.     Past Medical History:  Diagnosis Date  . Benign prostatic hypertrophy   . Essential hypertension   . GERD (gastroesophageal reflux disease)   . Low back pain   . Noncompliance with medications   . Obesity   . OSA (obstructive sleep apnea)    Non-compliant with CPAP  . Type 2 diabetes mellitus (HCC)     Past Surgical History:  Procedure Laterality Date  . BICEPT TENODESIS Right 11/10/2014   Procedure: BICEPS TENODESIS;  Surgeon: [name], MD;  Location: [facility];  Service: Orthopedics;  Laterality: Right;  biceps tendon  . SHOULDER ARTHROSCOPY    . SHOULDER ARTHROSCOPY  05/21/2012   Procedure: ARTHROSCOPY SHOULDER;  Surgeon: [name], MD;  Location: [facility];  Service: Orthopedics;  Laterality: Right;  . SPINE SURGERY     Dr Danielle Dess   . SUBACROMIAL DECOMPRESSION  05/21/2012   Procedure: SUBACROMIAL DECOMPRESSION;  Surgeon: [name], MD;  Location: [facility];  Service: Orthopedics;  Laterality: Right;    Family History  Problem Relation Age of Onset  .  Diabetes Father   . Hypertension Father   . Heart failure Father   . Kidney disease Father   . Diabetes Sister   . Hypertension Sister   . Hypertension Mother    Social History   Tobacco Use  . Smoking status: Never Smoker  . Smokeless tobacco: Never Used  Substance Use Topics  . Alcohol use: Yes    Alcohol/week: 0.0 standard drinks    Comment: Occasionally  . Drug use: No    Allergies  Allergen Reactions  . Demerol [Meperidine] Itching  . Lansoprazole Other (See Comments)    unknown    No outpatient medications have been marked as taking for the 09/03/18 encounter (Office Visit) with [name], MD.    BP (!) 160/110   Pulse 71   Ht 6\' 1"  (1.854 m)   Wt 265 lb (120.2 kg)   BMI 34.96 kg/m   Physical Exam  Constitutional: He is oriented to person, place, and time. He appears well-developed and well-nourished.  Neurological: He is alert and oriented to person, place, and time.  Psychiatric: He has a normal mood and affect. His behavior is normal. Judgment and thought content normal.    Ortho Exam  C-spine limited motion in all planes seems to do better with range of motion to the left has pain with Spurling's maneuver running in the right arm tenderness and spasm is noted on the right side of his  neck with no skin changes  Cervical spine lymph nodes are normal on both sides  He has good strength in both hands and arms no instability on either side full range of motion of both arms no tenderness in either arm skin normal in both arms pulses good bilaterally and no sensory deficits 2+ reflexes in each elbow    MEDICAL DECISION SECTION  Xrays were done at [facility]  My independent reading of xrays:  C-spine 5 views extensive cervical disc disease C2-3 C4-5 C4-5 foraminal encroachment bilaterally abnormal cervical lordosis multilevel disease.  There is looks pretty significant  Lumbar spine 5 views as well L4-5 disc space abnormality with osteophytes vacuum  disc phenomenon facet arthrosis multilevel disease including 34, 23    Encounter Diagnosis  Name Primary?  . Spinal stenosis in cervical region Yes    PLAN: (Rx., injectx, surgery, frx, mri/ct)  Meds ordered this encounter  Medications  . predniSONE (STERAPRED UNI-PAK 48 TAB) 10 MG (48) TBPK tablet    Sig: Take by mouth daily.    Dispense:  48 tablet    Refill:  1  . methocarbamol (ROBAXIN) 500 MG tablet    Sig: Take 1 tablet (500 mg total) by mouth 3 (three) times daily.    Dispense:  60 tablet    Refill:  1   CONTINUE PERCOCET AND CELEBREX  FU 6 WEEKS  Fuller Canada, MD  09/03/2018 10:49 AM

## 2018-09-03 NOTE — ED Provider Notes (Signed)
[facility] EMERGENCY DEPARTMENT Provider Note   CSN: [id number] Arrival date & time: 09/02/18  1244     History   Chief Complaint Chief Complaint  Patient presents with  . Hypertension    HPI Geoffrey Jackson is a 52 y.o. male.  Patient was brought to emergency department for pain in his upper back and numbness rating down his right arm.  Along with mild elevation in his blood pressure.  The history is provided by the patient.  Illness  This is a new problem. The current episode started yesterday. The problem occurs constantly. The problem has not changed since onset.Pertinent negatives include no chest pain, no abdominal pain and no headaches. Nothing aggravates the symptoms. Nothing relieves the symptoms. He has tried nothing for the symptoms. The treatment provided no relief.    Past Medical History:  Diagnosis Date  . Benign prostatic hypertrophy   . Essential hypertension   . GERD (gastroesophageal reflux disease)   . Low back pain   . Noncompliance with medications   . Obesity   . OSA (obstructive sleep apnea)    Non-compliant with CPAP  . Type 2 diabetes mellitus [facility])     Patient Active Problem List   Diagnosis Date Noted  . Syncope 07/30/2017  . Nausea, vomiting, and diarrhea 12/17/2015  . Headache 12/17/2015  . Hypertensive emergency 12/17/2015  . CAP (community acquired pneumonia) 06/15/2015  . Elevated troponin I level 06/15/2015  . Biceps tendonitis on right   . Tear of biceps muscle 10/25/2013  . Right shoulder pain 10/25/2013  . Chest pain 04/13/2013  . Diabetes mellitus type 2 in obese (HCC) 02/09/2007  . Hyperlipidemia 12/08/2006  . Obesity 12/08/2006  . Essential hypertension 12/08/2006  . GERD 12/08/2006  . BENIGN PROSTATIC HYPERTROPHY 12/08/2006    Past Surgical History:  Procedure Laterality Date  . BICEPT TENODESIS Right 11/10/2014   Procedure: BICEPS TENODESIS;  Surgeon: [name], MD;  Location: [facility];  Service:  Orthopedics;  Laterality: Right;  biceps tendon  . SHOULDER ARTHROSCOPY    . SHOULDER ARTHROSCOPY  05/21/2012   Procedure: ARTHROSCOPY SHOULDER;  Surgeon: [name], MD;  Location: [facility];  Service: Orthopedics;  Laterality: Right;  . SPINE SURGERY     Dr Danielle Dess   . SUBACROMIAL DECOMPRESSION  05/21/2012   Procedure: SUBACROMIAL DECOMPRESSION;  Surgeon: [name], MD;  Location: [facility];  Service: Orthopedics;  Laterality: Right;        Home Medications    Prior to Admission medications   Medication Sig Start Date End Date Taking? Authorizing Provider  albuterol (PROVENTIL HFA;VENTOLIN HFA) 108 (90 BASE) MCG/ACT inhaler Inhale 2 puffs into the lungs every 4 (four) hours as needed for wheezing or shortness of breath. 06/21/15  Yes Idol, Raynelle Fanning, PA-C  amLODipine (NORVASC) 10 MG tablet Take 10 mg by mouth daily. 11/15/15  Yes [provider]  aspirin 81 MG chewable tablet Chew 1 tablet (81 mg total) by mouth daily. 08/02/17  Yes Oval Linsey, MD  cloNIDine (CATAPRES) 0.2 MG tablet Take 0.2 mg by mouth 2 (two) times daily. 04/28/18  Yes [provider]  dapagliflozin propanediol (FARXIGA) 10 MG TABS tablet Take 10 mg by mouth daily.   Yes [provider]  gabapentin (NEURONTIN) 300 MG capsule Take 300 mg by mouth 2 (two) times daily. 05/03/18  Yes [provider]  ibuprofen (ADVIL,MOTRIN) 800 MG tablet Take 1 tablet (800 mg total) by mouth every 8 (eight) hours as needed.  07/14/18  Yes [name], MD  Insulin Glargine (TOUJEO SOLOSTAR) 300 UNIT/ML SOPN Inject 50 Units into the skin at bedtime. 12/19/15  Yes Oval Linsey, MD  labetalol (NORMODYNE) 200 MG tablet Take 1 tablet (200 mg total) by mouth 2 (two) times daily. 08/01/17  Yes Oval Linsey, MD  metFORMIN (GLUCOPHAGE) 500 MG tablet Take 500 mg by mouth 2 (two) times daily with a meal.    Yes [provider]  nitroGLYCERIN (NITROSTAT) 0.4 MG SL tablet Place 1 tablet  (0.4 mg total) under the tongue every 5 (five) minutes as needed for chest pain. 06/19/15  Yes Dondiego, Gerlene Burdock, MD  olmesartan (BENICAR) 20 MG tablet Take 20 mg by mouth daily.   Yes [provider]  potassium chloride SA (K-DUR,KLOR-CON) 20 MEQ tablet Take 1 tablet (20 mEq total) by mouth daily. 08/01/17  Yes Dondiego, Gerlene Burdock, MD  pravastatin (PRAVACHOL) 40 MG tablet Take 40 mg by mouth daily.   Yes [provider]  tamsulosin (FLOMAX) 0.4 MG CAPS capsule Take 1 capsule by mouth daily. 11/15/15  Yes [provider]  celecoxib (CELEBREX) 100 MG capsule Take 1 capsule (100 mg total) by mouth 2 (two) times daily. 09/02/18   Bethann Berkshire, MD  methocarbamol (ROBAXIN) 500 MG tablet Take 1 tablet (500 mg total) by mouth 3 (three) times daily. 09/03/18   [name], MD  oxyCODONE-acetaminophen (PERCOCET/ROXICET) 5-325 MG tablet Take 1 tablet by mouth every 6 (six) hours as needed. 09/02/18   Bethann Berkshire, MD  predniSONE (STERAPRED UNI-PAK 48 TAB) 10 MG (48) TBPK tablet Take by mouth daily. 09/03/18   [name], MD    Family History Family History  Problem Relation Age of Onset  . Diabetes Father   . Hypertension Father   . Heart failure Father   . Kidney disease Father   . Diabetes Sister   . Hypertension Sister   . Hypertension Mother     Social History Social History   Tobacco Use  . Smoking status: Never Smoker  . Smokeless tobacco: Never Used  Substance Use Topics  . Alcohol use: Yes    Alcohol/week: 0.0 standard drinks    Comment: Occasionally  . Drug use: No     Allergies   Demerol [meperidine] and Lansoprazole   Review of Systems Review of Systems  Constitutional: Negative for appetite change and fatigue.  HENT: Negative for congestion, ear discharge and sinus pressure.   Eyes: Negative for discharge.  Respiratory: Negative for cough.   Cardiovascular: Negative for chest pain.  Gastrointestinal: Negative for abdominal  pain and diarrhea.  Genitourinary: Negative for frequency and hematuria.  Musculoskeletal: Negative for back pain.  Skin: Negative for rash.  Neurological: Negative for seizures and headaches.       Pain radiating down his right arm also with some pain in his lumbar spine  Psychiatric/Behavioral: Negative for hallucinations.     Physical Exam Updated Vital Signs BP (!) 148/80   Pulse 61   Temp 98.2 F (36.8 C) (Oral)   Resp 14   SpO2 96%   Physical Exam  Constitutional: He is oriented to person, place, and time. He appears well-developed.  HENT:  Head: Normocephalic.  Eyes: Conjunctivae and EOM are normal. No scleral icterus.  Neck: Neck supple. No thyromegaly present.  Cardiovascular: Normal rate and regular rhythm. Exam reveals no gallop and no friction rub.  No murmur heard. Pulmonary/Chest: No stridor. He has no wheezes. He has no rales. He exhibits no tenderness.  Abdominal: He exhibits no distension. There is no tenderness. There is no rebound.  Musculoskeletal: Normal range of motion. He exhibits no edema.  Tenderness to posterior neck and lumbar spine  Lymphadenopathy:    He has no cervical adenopathy.  Neurological: He is oriented to person, place, and time. He exhibits normal muscle tone. Coordination normal.  Skin: No rash noted. No erythema.  Psychiatric: He has a normal mood and affect. His behavior is normal.     ED Treatments / Results  Labs (all labs ordered are listed, but only abnormal results are displayed) Labs Reviewed - No data to display  EKG EKG Interpretation  Date/Time:  Thursday September 02 2018 12:52:45 EDT Ventricular Rate:  61 PR Interval:  156 QRS Duration: 90 QT Interval:  456 QTC Calculation: 459 R Axis:   1 Text Interpretation:  Normal sinus rhythm Normal ECG Confirmed by Bethann Berkshire [phone]) on 09/02/2018 1:12:12 PM   Radiology Dg Cervical Spine Complete  Result Date: 09/02/2018 CLINICAL DATA:  Back pain, right-sided  neck pain EXAM: CERVICAL SPINE - COMPLETE 4+ VIEW COMPARISON:  None. FINDINGS: There is no evidence of cervical spine fracture or prevertebral soft tissue swelling. Alignment is normal. No other significant bone abnormalities are identified. Degenerative disc disease with disc height loss at C2-3 and C4-5. Bilateral foraminal encroachment at C4-5. IMPRESSION: No acute osseous injury of the cervical spine. Electronically Signed   By: Elige Ko   On: 09/02/2018 14:51   Dg Lumbar Spine Complete  Result Date: 09/02/2018 CLINICAL DATA:  RIGHT-side back pain for 1 week EXAM: LUMBAR SPINE - COMPLETE 4+ VIEW COMPARISON:  03/02/2014 FINDINGS: 5 non-rib-bearing lumbar vertebra. Osseous mineralization normal. Vertebral body and disc space heights maintained. Scattered endplate spur formation. No fracture, subluxation or bone destruction. No spondylolysis. SI joints preserved. IMPRESSION: Mild scattered degenerative disc disease changes. No acute abnormalities. Electronically Signed   By: Ulyses Southward M.D.   On: 09/02/2018 14:46    Procedures Procedures (including critical care time)  Medications Ordered in ED Medications  HYDROcodone-acetaminophen (NORCO/VICODIN) 5-325 MG per tablet 1 tablet (1 tablet Oral Given 09/02/18 1338)  HYDROmorphone (DILAUDID) injection 1 mg (1 mg Intramuscular Given 09/02/18 1531)     Initial Impression / Assessment and Plan / ED Course  I have reviewed the triage vital signs and the nursing notes.  Pertinent labs & imaging results that were available during my care of the patient were reviewed by me and considered in my medical decision making (see chart for details).   Suspect cervical colopathy.  Patient will be needed with Celebrex and pain medicine.  His blood pressure seems to be mildly elevated he will follow-up with his PCP  Final Clinical Impressions(s) / ED Diagnoses   Final diagnoses:  Cervical radiculitis    ED Discharge Orders         Ordered     celecoxib (CELEBREX) 100 MG capsule  2 times daily     09/02/18 1546    oxyCODONE-acetaminophen (PERCOCET/ROXICET) 5-325 MG tablet  Every 6 hours PRN     09/02/18 1546           Bethann Berkshire, MD 09/03/18 1054

## 2018-09-13 ENCOUNTER — Encounter: Payer: Self-pay | Admitting: Orthopedic Surgery

## 2018-09-13 ENCOUNTER — Ambulatory Visit: Payer: BC Managed Care – PPO | Admitting: Orthopedic Surgery

## 2018-09-13 VITALS — BP 147/91 | HR 82 | Ht 73.0 in | Wt 249.0 lb

## 2018-09-13 DIAGNOSIS — M4802 Spinal stenosis, cervical region: Secondary | ICD-10-CM

## 2018-09-13 MED ORDER — METHOCARBAMOL 500 MG PO TABS: 500.0000 mg | ORAL_TABLET | Freq: Three times a day (TID) | ORAL | 1 refills | Status: DC

## 2018-09-13 MED ORDER — PREDNISONE 10 MG (48) PO TBPK: ORAL_TABLET | Freq: Every day | ORAL | 1 refills | Status: DC

## 2018-09-13 MED ORDER — OXYCODONE-ACETAMINOPHEN 5-325 MG PO TABS: 1.0000 | ORAL_TABLET | Freq: Three times a day (TID) | ORAL | 0 refills | Status: DC | PRN

## 2018-09-13 NOTE — Patient Instructions (Signed)
MRI scan has been ordered for you, we will get approval for the scan then you can schedule. I will call you if it is approved and you can call [facility] to schedule.

## 2018-09-13 NOTE — Progress Notes (Signed)
Chief Complaint  Patient presents with  . Hand Problem    right index finger "not better"  . Neck Pain    states pain unbearable / patient not sure what meds he is on, states "same" he is not on Gabapentin    52 year old male presented with acute onset of cervical spine pain radiating to his right shoulder arm with numbness and tingling associated with discomfort in his lower back.  He went to the ER received Percocet Celebrex and then I gave him Robaxin steroid Dosepak and he has not improved he has severe muscle spasm in the right upper extremity cervical spine decreased range of motion weakness in the right arm  XRAYS SHOW CERVICAL DISC DISEASE    Review of Systems  Constitutional: Negative for chills, fever, malaise/fatigue and weight loss.    RECOMMEND MRI C SPINE TO EVALUATE RUE WEAKNESS     Encounter Diagnosis  Name Primary?  . Spinal stenosis in cervical region Yes   Meds ordered this encounter  Medications  . methocarbamol (ROBAXIN) 500 MG tablet    Sig: Take 1 tablet (500 mg total) by mouth 3 (three) times daily.    Dispense:  60 tablet    Refill:  1  . predniSONE (STERAPRED UNI-PAK 48 TAB) 10 MG (48) TBPK tablet    Sig: Take by mouth daily.    Dispense:  48 tablet    Refill:  1  . oxyCODONE-acetaminophen (PERCOCET/ROXICET) 5-325 MG tablet    Sig: Take 1 tablet by mouth every 8 (eight) hours as needed.    Dispense:  21 tablet    Refill:  0

## 2018-09-17 ENCOUNTER — Telehealth: Payer: Self-pay | Admitting: Orthopedic Surgery

## 2018-09-17 DIAGNOSIS — M4802 Spinal stenosis, cervical region: Secondary | ICD-10-CM

## 2018-09-17 NOTE — Telephone Encounter (Signed)
Geoffrey Jackson called this morning wanting to know when his MRI was scheduled and what needs to be done after that.  I told him that I would have to speak to you to see if MRI has been approved.  I told him that someone would call him on Monday.  Please check on this for Geoffrey Jackson  Thanks

## 2018-09-20 NOTE — Telephone Encounter (Signed)
Checked [employer] and they did not approve the scan. He has to have conservative treatment first.   I have called him to advise.   He is asking for refill of Percocet

## 2018-09-21 ENCOUNTER — Telehealth: Payer: Self-pay | Admitting: Orthopedic Surgery

## 2018-09-21 NOTE — Telephone Encounter (Signed)
I spoke to him, he states [facility] called him to schedule, I have told him not to schedule the study until it is approved, and it is not.

## 2018-09-21 NOTE — Telephone Encounter (Signed)
I spoke to him yesterday. [employer] did not approve the MRI. I will call him later.

## 2018-09-21 NOTE — Telephone Encounter (Signed)
Patient called wanting to speak with you in reference to the MRI he is suppose to be getting.  Please call at [phone]

## 2018-09-23 NOTE — Progress Notes (Signed)
[facility] - Clinic Note  09/27/2018     CHIEF COMPLAINT Patient presents for Retina Evaluation   HISTORY OF PRESENT ILLNESS: Geoffrey Jackson is a 52 y.o. male who presents to the clinic today for:   HPI    Retina Evaluation    In left eye.  This started weeks ago.  Duration of weeks.  Context:  distance vision, mid-range vision and near vision.  Treatments tried include no treatments.  I, the attending physician,  performed the HPI with the patient and updated documentation appropriately.          Comments    52 y/o male pt referred by Dr. Daisy Lazar for eval of retinal hole OS.  Saw Dr. Charise Killian last week.  VA OS has been blurred for some time.  VA OD good.  Denies pain, flashes, floaters, but c/o intermittent headaches.  BS today was 128.  A1C 7.0.       Last edited by Rennis Chris, MD on 09/27/2018  2:46 PM. (History)    pt reports that he saw Dr. 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 Charise Killian, pt states he has a new rx to get glasses, pt endorses being diabetic and having HTN  Referring physician: Daisy Lazar, DO [address] [place], [place] [address]  HISTORICAL INFORMATION:   Selected notes from the MEDICAL RECORD NUMBER Referred by Dr. Daisy Lazar for concern of retinal hole OS LEE: 10.21.19 (M. Cotter) [BCVA: OD: 20/20- OS: 20/20-] Ocular Hx- PMH-DM (takes Toujeo and metformin), HTN    CURRENT MEDICATIONS: Current Outpatient Medications (Ophthalmic Drugs)  Medication Sig  . prednisoLONE acetate (PRED FORTE) 1 % ophthalmic suspension Place 1 drop into the left eye 4 (four) times daily for 7 days.    No current facility-administered medications for this visit.  (Ophthalmic Drugs)   Current Outpatient Medications (Other)  Medication Sig  . albuterol (PROVENTIL HFA;VENTOLIN HFA) 108 (90 BASE) MCG/ACT inhaler Inhale 2 puffs into the lungs every 4 (four) hours as needed for wheezing or shortness of breath.  Marland Kitchen amLODipine (NORVASC) 10 MG tablet Take 10 mg by mouth daily.  Marland Kitchen aspirin 81 MG chewable tablet Chew 1 tablet (81 mg total) by mouth daily.  . celecoxib (CELEBREX) 100 MG capsule Take 1 capsule (100 mg total) by mouth 2 (two) times daily.  . cloNIDine (CATAPRES) 0.2 MG tablet Take 0.2 mg by mouth 2 (two) times daily.  . dapagliflozin propanediol (FARXIGA) 10 MG TABS tablet Take 10 mg by mouth daily.  Marland Kitchen gabapentin (NEURONTIN) 300 MG capsule Take 300 mg by mouth 2 (two) times daily.  Marland Kitchen ibuprofen (ADVIL,MOTRIN) 800 MG tablet Take 1 tablet (800 mg total) by mouth every 8 (eight) hours as needed.  . Insulin Glargine (TOUJEO SOLOSTAR) 300 UNIT/ML SOPN Inject 50 Units into the skin at bedtime.  Marland Kitchen labetalol (NORMODYNE) 200 MG tablet Take 1 tablet (200 mg total) by mouth 2 (two) times daily.  Marland Kitchen LEVEMIR FLEXTOUCH 100 UNIT/ML Pen   . metFORMIN (GLUCOPHAGE) 500 MG tablet Take 500 mg by mouth 2 (two) times  daily with a meal.   . methocarbamol (ROBAXIN) 500 MG tablet Take 1 tablet (500 mg total) by mouth 3 (three) times daily.  . nitroGLYCERIN (NITROSTAT) 0.4 MG SL tablet Place 1 tablet (0.4 mg total) under the tongue every 5 (five) minutes as needed for chest pain.  Marland Kitchen olmesartan (BENICAR) 20 MG tablet Take 20 mg by mouth daily.  Marland Kitchen oxyCODONE-acetaminophen (PERCOCET/ROXICET) 5-325 MG tablet Take 1 tablet by mouth every 8 (eight) hours as needed.  . potassium chloride SA (K-DUR,KLOR-CON) 20 MEQ tablet Take 1 tablet (20 mEq total) by mouth daily.  . pravastatin (PRAVACHOL) 40 MG tablet Take 40 mg by mouth daily.  . predniSONE (STERAPRED UNI-PAK 48 TAB) 10 MG (48) TBPK tablet Take by mouth daily.  .  tamsulosin (FLOMAX) 0.4 MG CAPS capsule Take 1 capsule by mouth daily.   No current facility-administered medications for this visit.  (Other)      REVIEW OF SYSTEMS: ROS    Positive for: Endocrine, Eyes   Negative for: Constitutional, Gastrointestinal, Neurological, Skin, Genitourinary, Musculoskeletal, HENT, Cardiovascular, Respiratory, Psychiatric, Allergic/Imm, Heme/Lymph   Last edited by Celine Mans, COA on 09/27/2018  2:05 PM. (History)       ALLERGIES Allergies  Allergen Reactions  . Demerol [Meperidine] Itching  . Lansoprazole Other (See Comments)    unknown    PAST MEDICAL HISTORY Past Medical History:  Diagnosis Date  . Benign prostatic hypertrophy   . Essential hypertension   . GERD (gastroesophageal reflux disease)   . Low back pain   . Noncompliance with medications   . Obesity   . OSA (obstructive sleep apnea)    Non-compliant with CPAP  . Type 2 diabetes mellitus (HCC)    Past Surgical History:  Procedure Laterality Date  . BICEPT TENODESIS Right 11/10/2014   Procedure: BICEPS TENODESIS;  Surgeon: [name], MD;  Location: [facility];  Service: Orthopedics;  Laterality: Right;  biceps tendon  . SHOULDER ARTHROSCOPY    . SHOULDER ARTHROSCOPY  05/21/2012   Procedure: ARTHROSCOPY SHOULDER;  Surgeon: [name], MD;  Location: [facility];  Service: Orthopedics;  Laterality: Right;  . SPINE SURGERY     Dr Danielle Dess   . SUBACROMIAL DECOMPRESSION  05/21/2012   Procedure: SUBACROMIAL DECOMPRESSION;  Surgeon: [name], MD;  Location: [facility];  Service: Orthopedics;  Laterality: Right;    FAMILY HISTORY Family History  Problem Relation Age of Onset  . Diabetes Father   . Hypertension Father   . Heart failure Father   . Kidney disease Father   . Diabetes Sister   . Hypertension Sister   . Hypertension Mother     SOCIAL HISTORY Social History   Tobacco Use  . Smoking status: Never Smoker  . Smokeless tobacco: Never Used   Substance Use Topics  . Alcohol use: Yes    Alcohol/week: 0.0 standard drinks    Comment: Occasionally  . Drug use: No         OPHTHALMIC EXAM:  Base Eye Exam    Visual Acuity (Snellen - Linear)      Right Left   Dist [place] 20/50 -2 20/60 -2   Dist ph [place] 20/30 -2 20/40       Tonometry (Tonopen, 2:28 PM)      Right Left   Pressure 13 13       Pupils      Dark Light Shape React APD   Right 3 2 Round Brisk None   Left 3 2  Round Brisk None       Visual Fields (Counting fingers)      Left Right    Full Full       Extraocular Movement      Right Left    Full, Ortho Full, Ortho       Neuro/Psych    Oriented x3:  Yes   Mood/Affect:  Normal       Dilation    Both eyes:  1.0% Mydriacyl, 2.5% Phenylephrine @ 2:28 PM        Slit Lamp and Fundus Exam    Slit Lamp Exam      Right Left   Lids/Lashes mild Dermatochalasis - upper lid, mild Meibomian gland dysfunction mild Dermatochalasis - upper lid, mild Meibomian gland dysfunction   Conjunctiva/Sclera mild Melanosis mild Melanosis   Cornea Arcus, 1+ Punctate epithelial erosions Arcus, 1+ Punctate epithelial erosions   Anterior Chamber Deep and quiet Deep and quiet   Iris Round and dilated, No NVI Round and dilated, No NVI   Lens 1+ Nuclear sclerosis, 1-2+ Cortical cataract 1+ Nuclear sclerosis, 1-2+ Cortical cataract   Vitreous mild Vitreous syneresis mild Vitreous syneresis       Fundus Exam      Right Left   Disc Pink and Sharp Pink and Sharp   C/D Ratio 0.55 0.6   Macula Flat, Good foveal reflex, mild Retinal pigment epithelial mottling, No heme or edema Flat, Good foveal reflex, mild Retinal pigment epithelial mottling, No heme or edema   Vessels Vascular attenuation, mild AV crossing changes Vascular attenuation, mild AV crossing changes   Periphery Attached, peripheral cystoid degeneration inferiorly, small break at 0900 - no SRF, area of focal lattice with atrophic hole at 1000, pigmented break at 0430  Attached, round hole at 0300 ora--no SRF; patch of lattice at 0430 with atrophic holes almost to ora, atrophic hole 0730         Refraction    Wearing Rx      Sphere Cylinder Axis   Right -1.50 +0.75 166   Left -3.00 +2.50 017   Age:  50yrs   Type:  SVL       Manifest Refraction      Sphere Cylinder Axis Dist VA   Right -1.75 +0.75 160 20/20   Left -3.00 +2.75 015 20/20          IMAGING AND PROCEDURES  Imaging and Procedures for @TODAY @  OCT, Retina - OU - Both Eyes       Right Eye Quality was good. Central Foveal Thickness: 267. Progression has no prior data. Findings include normal foveal contour, no SRF, no IRF (Retinal atrophy/thinning temporally/inferiorly).   Left Eye Quality was good. Central Foveal Thickness: 280. Progression has no prior data. Findings include normal foveal contour, no SRF, no IRF (Retinal atrophy/thinning temporally/inferiorly).   Notes *Images captured and stored on drive  Diagnosis / Impression:  NFP, No IRF/SRF, retinal thinning temporally and inferiorly OU   Clinical management:  See below  Abbreviations: NFP - Normal foveal profile. CME - cystoid macular edema. PED - pigment epithelial detachment. IRF - intraretinal fluid. SRF - subretinal fluid. EZ - ellipsoid zone. ERM - epiretinal membrane. ORA - outer retinal atrophy. ORT - outer retinal tubulation. SRHM - subretinal hyper-reflective material        Repair Retinal Breaks, Laser - OS - Left Eye       LASER PROCEDURE NOTE  Procedure:  Barrier laser retinopexy using laser indirect ophthalmoscope, LEFT eye  Diagnosis:   Retinal holes, LEFT eye                     0300, 0430, and 0730 o'clock anterior to equator   Surgeon: Rennis Chris, MD, PhD  Anesthesia: Topical  Informed consent obtained, operative eye marked, and time out performed prior to initiation of laser.   Laser settings:  Lumenis [id number] laser indirect ophthalmoscope Power: 230 mW Duration: 70 msec  #  spots: 447  Placement of laser: Laser was placed in three confluent rows around retinal breaks at 3, 430 and 730 oclock anterior to equator with additional rows anteriorly.  Complications: None.  Patient tolerated the procedure well and received written and verbal post-procedure care information/education.                 ASSESSMENT/PLAN:    ICD-10-CM   1. 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 Diabetes mellitus type 2 without retinopathy (HCC) E11.9   3. Retinal edema H35.81 OCT, Retina - OU - Both Eyes  4. Essential hypertension I10   5. Hypertensive retinopathy of both eyes H35.033   6. Combined forms of age-related cataract of both eyes H25.813     1. Retinal holes, OU   - OD: 0430, 0900, 1000 - OS: 0300, 0430, 0730 - no SRF or RD - The incidence, risk factors, and natural history of retinal holes were discussed with patient.   - Potential treatment options including laser retinopexy and cryotherapy discussed with patient. - recommend laser retinopexy OU, OS first (11.04.19) - RBA of procedure discussed, questions answered - informed consent obtained and signed - see procedure note - start PF QID x7 days OS - f/u in 2 wks for POV OS, and laser OD  2. Diabetes mellitus, type 2 without retinopathy OU - The incidence, risk factors for progression, natural history and treatment options for diabetic retinopathy  were discussed with patient.   - The need for close monitoring of blood glucose, blood pressure, and serum lipids, avoiding cigarette or any type of tobacco, and the need for long term follow up was also discussed with patient. - f/u in 1 year, sooner prn  3. No retinal edema on exam or OCT  4,5. Hypertensive retinopathy OU - discussed importance of tight BP control - monitor  6. Mixed Cataracts OU - The symptoms of cataract, surgical options, and treatments and risks were discussed with patient. - discussed  diagnosis and progression - not yet visually significant - monitor for now   Ophthalmic Meds Ordered this visit:  Meds ordered this encounter  Medications  . prednisoLONE acetate (PRED FORTE) 1 % ophthalmic suspension    Sig: Place 1 drop into the left eye 4 (four) times daily for 7 days.    Dispense:  10 mL    Refill:  0       Return in about 2 weeks (around 10/11/2018) for retinal tears OU, DFE s/p laser OD, laser OS.  There are no Patient Instructions on file for this visit.   Explained the diagnoses, plan, and follow up with the patient and they expressed understanding.  Patient expressed understanding of the importance of proper follow up care.   This document serves as a record of services personally performed by Karie Chimera, MD, PhD. It was created on their behalf by Laurian Brim, OA, an ophthalmic assistant. The creation of this record is the provider's dictation and/or activities during the  visit.    Electronically signed by: Laurian Brim, OA  10.31.19 12:06 PM    Karie Chimera, M.D., Ph.D. Diseases & Surgery of the Retina and Vitreous [facility]   I have reviewed the above documentation for accuracy and completeness, and I agree with the above. Karie Chimera, M.D., Ph.D. 09/28/18 12:06 PM   Abbreviations: M myopia (nearsighted); A astigmatism; H hyperopia (farsighted); P presbyopia; Mrx spectacle prescription;  CTL contact lenses; OD right eye; OS left eye; OU both eyes  XT exotropia; ET esotropia; PEK punctate epithelial keratitis; PEE punctate epithelial erosions; DES dry eye syndrome; MGD meibomian gland dysfunction; ATs artificial tears; PFAT's preservative free artificial tears; NSC nuclear sclerotic cataract; PSC posterior subcapsular cataract; ERM epi-retinal membrane; PVD posterior vitreous detachment; RD retinal detachment; DM diabetes mellitus; DR diabetic retinopathy; NPDR non-proliferative diabetic retinopathy; PDR  proliferative diabetic retinopathy; CSME clinically significant macular edema; DME diabetic macular edema; dbh dot blot hemorrhages; CWS cotton wool spot; POAG primary open angle glaucoma; C/D cup-to-disc ratio; HVF humphrey visual field; GVF goldmann visual field; OCT optical coherence tomography; IOP intraocular pressure; BRVO Branch retinal vein occlusion; CRVO central retinal vein occlusion; CRAO central retinal artery occlusion; BRAO branch retinal artery occlusion; RT retinal tear; SB scleral buckle; PPV pars plana vitrectomy; VH Vitreous hemorrhage; PRP panretinal laser photocoagulation; IVK intravitreal kenalog; VMT vitreomacular traction; MH Macular hole;  NVD neovascularization of the disc; NVE neovascularization elsewhere; AREDS age related eye disease study; ARMD age related macular degeneration; POAG primary open angle glaucoma; EBMD epithelial/anterior basement membrane dystrophy; ACIOL anterior chamber intraocular lens; IOL intraocular lens; PCIOL posterior chamber intraocular lens; Phaco/IOL phacoemulsification with intraocular lens placement; PRK photorefractive keratectomy; LASIK laser assisted in situ keratomileusis; HTN hypertension; DM diabetes mellitus; COPD chronic obstructive pulmonary disease

## 2018-09-27 ENCOUNTER — Encounter (INDEPENDENT_AMBULATORY_CARE_PROVIDER_SITE_OTHER): Payer: Self-pay | Admitting: Ophthalmology

## 2018-09-27 ENCOUNTER — Ambulatory Visit (INDEPENDENT_AMBULATORY_CARE_PROVIDER_SITE_OTHER): Payer: BC Managed Care – PPO | Admitting: Ophthalmology

## 2018-09-27 DIAGNOSIS — H3581 Retinal edema: Secondary | ICD-10-CM | POA: Diagnosis not present

## 2018-09-27 DIAGNOSIS — E119 Type 2 diabetes mellitus without complications: Secondary | ICD-10-CM

## 2018-09-27 DIAGNOSIS — H33323 Round hole, bilateral: Secondary | ICD-10-CM | POA: Diagnosis not present

## 2018-09-27 DIAGNOSIS — H25813 Combined forms of age-related cataract, bilateral: Secondary | ICD-10-CM

## 2018-09-27 DIAGNOSIS — H35033 Hypertensive retinopathy, bilateral: Secondary | ICD-10-CM

## 2018-09-27 DIAGNOSIS — I10 Essential (primary) hypertension: Secondary | ICD-10-CM

## 2018-09-27 MED ORDER — PREDNISOLONE ACETATE 1 % OP SUSP: 1.0000 [drp] | Freq: Four times a day (QID) | OPHTHALMIC | 0 refills | Status: AC

## 2018-09-28 ENCOUNTER — Encounter (INDEPENDENT_AMBULATORY_CARE_PROVIDER_SITE_OTHER): Payer: Self-pay | Admitting: Ophthalmology

## 2018-10-01 ENCOUNTER — Other Ambulatory Visit: Payer: Self-pay | Admitting: Orthopedic Surgery

## 2018-10-01 DIAGNOSIS — M4802 Spinal stenosis, cervical region: Secondary | ICD-10-CM

## 2018-10-01 NOTE — Telephone Encounter (Signed)
Patient requests refill:  oxyCODONE-acetaminophen (PERCOCET/ROXICET) 5-325 MG tablet 21 tablet   (also asked if a stronger dosage can be prescribed) -[facility], [place].   - patient aware requests after Thursdays at noon are addressed the following Monday.

## 2018-10-04 ENCOUNTER — Other Ambulatory Visit: Payer: Self-pay | Admitting: Orthopedic Surgery

## 2018-10-04 MED ORDER — HYDROCODONE-ACETAMINOPHEN 10-325 MG PO TABS: 1.0000 | ORAL_TABLET | Freq: Four times a day (QID) | ORAL | 0 refills | Status: AC | PRN

## 2018-10-04 NOTE — Telephone Encounter (Signed)
No we cannot make a stronger dose and I am going to change it from oxycodone to hydrocodone he is been on the oxycodone too long

## 2018-10-04 NOTE — Progress Notes (Signed)
Patient sent for MRI MRI denied by insurance patient says oxycodone not strong enough  Not willing to go up on the medication  Switch medication see if hydrocodone does better

## 2018-10-05 ENCOUNTER — Other Ambulatory Visit: Payer: Self-pay | Admitting: Orthopedic Surgery

## 2018-10-14 NOTE — Progress Notes (Addendum)
[facility] - Clinic Note  10/15/2018     CHIEF COMPLAINT Patient presents for Post-op Follow-up   HISTORY OF PRESENT ILLNESS: Geoffrey Jackson is a 52 y.o. male who presents to the clinic today for:   HPI    Post-op Follow-up    In left eye.  Vision is improved.  I, the attending physician,  performed the HPI with the patient and updated documentation appropriately.          Comments    52 y/o male pt here for 2.5 wk f/u s/p laser retinopexy OS on 11.04.2019.  Pt also here for laser retinopexy OD.  VA OU seems a little better than at last visit.  Denies pain, flashes, floaters.  Uses a gtt of PF in each eye "here and there; just in case."       Last edited by Rennis ChrisZamora, Brian, MD on 10/15/2018  2:58 PM. (History)    pt states other than being swollen he did well with laser, pt states he is still using the PF  Referring physician: Oval Linseyondiego, Richard, MD [address] [place], [place] [id number]  HISTORICAL INFORMATION:   Selected notes from the MEDICAL RECORD NUMBER Referred by Dr. Daisy LazarMark Cotter for concern of retinal hole OS LEE: 10.21.19 (M. Cotter) [BC[facility]: OD: 20/20- OS: 20/20-] Ocular Hx- PMH-DM (takes Toujeo and metformin), HTN    CURRENT MEDICATIONS: No current outpatient medications on file. (Ophthalmic Drugs)   No current facility-administered medications for this visit.  (Ophthalmic Drugs)   Current Outpatient Medications (Other)  Medication Sig  . albuterol (PROVENTIL HFA;VENTOLIN HFA) 108 (90 BASE) MCG/ACT inhaler Inhale 2 puffs into the lungs every 4 (four) hours as needed for wheezing or shortness of breath.  Marland Kitchen. amLODipine (NORVASC) 10 MG tablet Take 10 mg by mouth daily.  Marland Kitchen. aspirin 81 MG chewable tablet Chew 1 tablet (81 mg total) by mouth daily.  . celecoxib (CELEBREX) 100 MG capsule Take 1 capsule (100 mg total) by mouth 2 (two) times daily.  . cloNIDine (CATAPRES) 0.2 MG tablet Take 0.2 mg by mouth 2 (two) times daily.  .  dapagliflozin propanediol (FARXIGA) 10 MG TABS tablet Take 10 mg by mouth daily.  Marland Kitchen. gabapentin (NEURONTIN) 300 MG capsule Take 300 mg by mouth 2 (two) times daily.  Marland Kitchen. ibuprofen (ADVIL,MOTRIN) 800 MG tablet Take 1 tablet (800 mg total) by mouth every 8 (eight) hours as needed.  . Insulin Glargine (TOUJEO SOLOSTAR) 300 UNIT/ML SOPN Inject 50 Units into the skin at bedtime.  Marland Kitchen. labetalol (NORMODYNE) 200 MG tablet Take 1 tablet (200 mg total) by mouth 2 (two) times daily.  Marland Kitchen. LEVEMIR FLEXTOUCH 100 UNIT/ML Pen   . metFORMIN (GLUCOPHAGE) 500 MG tablet Take 500 mg by mouth 2 (two) times daily with a meal.   . methocarbamol (ROBAXIN) 500 MG tablet Take 1 tablet (500 mg total) by mouth 3 (three) times daily.  . nitroGLYCERIN (NITROSTAT) 0.4 MG SL tablet Place 1 tablet (0.4 mg total) under the tongue every 5 (five) minutes as needed for chest pain.  Marland Kitchen. olmesartan (BENICAR) 20 MG tablet Take 20 mg by mouth daily.  Marland Kitchen. oxyCODONE-acetaminophen (PERCOCET/ROXICET) 5-325 MG tablet Take 1 tablet by mouth every 8 (eight) hours as needed.  . potassium chloride SA (K-DUR,KLOR-CON) 20 MEQ tablet Take 1 tablet (20 mEq total) by mouth daily.  . pravastatin (PRAVACHOL) 40 MG tablet Take 40 mg by mouth daily.  . predniSONE (STERAPRED UNI-PAK 48 TAB) 10 MG (48) TBPK tablet  Take by mouth daily.  . tamsulosin (FLOMAX) 0.4 MG CAPS capsule Take 1 capsule by mouth daily.   No current facility-administered medications for this visit.  (Other)      REVIEW OF SYSTEMS: ROS    Positive for: Endocrine, Eyes   Negative for: Constitutional, Gastrointestinal, Neurological, Skin, Genitourinary, Musculoskeletal, HENT, Cardiovascular, Respiratory, Psychiatric, Allergic/Imm, Heme/Lymph   Last edited by Celine Mans, COA on 10/15/2018  2:00 PM. (History)       ALLERGIES Allergies  Allergen Reactions  . Demerol [Meperidine] Itching  . Lansoprazole Other (See Comments)    unknown    PAST MEDICAL HISTORY Past Medical History:   Diagnosis Date  . Benign prostatic hypertrophy   . Essential hypertension   . GERD (gastroesophageal reflux disease)   . Low back pain   . Noncompliance with medications   . Obesity   . OSA (obstructive sleep apnea)    Non-compliant with CPAP  . Type 2 diabetes mellitus (HCC)    Past Surgical History:  Procedure Laterality Date  . BICEPT TENODESIS Right 11/10/2014   Procedure: BICEPS TENODESIS;  Surgeon: [name], MD;  Location: [facility];  Service: Orthopedics;  Laterality: Right;  biceps tendon  . SHOULDER ARTHROSCOPY    . SHOULDER ARTHROSCOPY  05/21/2012   Procedure: ARTHROSCOPY SHOULDER;  Surgeon: [name], MD;  Location: [facility];  Service: Orthopedics;  Laterality: Right;  . SPINE SURGERY     Dr Danielle Dess   . SUBACROMIAL DECOMPRESSION  05/21/2012   Procedure: SUBACROMIAL DECOMPRESSION;  Surgeon: [name], MD;  Location: [facility];  Service: Orthopedics;  Laterality: Right;    FAMILY HISTORY Family History  Problem Relation Age of Onset  . Diabetes Father   . Hypertension Father   . Heart failure Father   . Kidney disease Father   . Diabetes Sister   . Hypertension Sister   . Hypertension Mother     SOCIAL HISTORY Social History   Tobacco Use  . Smoking status: Never Smoker  . Smokeless tobacco: Never Used  Substance Use Topics  . Alcohol use: Yes    Alcohol/week: 0.0 standard drinks    Comment: Occasionally  . Drug use: No         OPHTHALMIC EXAM:  Base Eye Exam    Visual Acuity (Snellen - Linear)      Right Left   Dist [place] 20/30 +2 20/60 -2   Dist ph [place] 20/25 -2 20/30 -2       Tonometry (Tonopen, 2:03 PM)      Right Left   Pressure 13 19       Pupils      Dark Light Shape React APD   Right 3 2 Round Brisk None   Left 3 2 Round Brisk None       Visual Fields (Counting fingers)      Left Right    Full Full       Extraocular Movement      Right Left    Full, Ortho Full, Ortho       Neuro/Psych    Oriented x3:   Yes   Mood/Affect:  Normal       Dilation    Both eyes:  1.0% Mydriacyl, 2.5% Phenylephrine @ 2:03 PM        Slit Lamp and Fundus Exam    Slit Lamp Exam      Right Left   Lids/Lashes mild Dermatochalasis - upper lid, mild Meibomian gland dysfunction  mild Dermatochalasis - upper lid, mild Meibomian gland dysfunction   Conjunctiva/Sclera mild Melanosis mild Melanosis   Cornea Arcus, 1+ Punctate epithelial erosions Arcus, 1+ Punctate epithelial erosions   Anterior Chamber Deep and quiet Deep and quiet   Iris Round and dilated, No NVI Round and dilated, No NVI   Lens 1+ Nuclear sclerosis, 1-2+ Cortical cataract 1+ Nuclear sclerosis, 1-2+ Cortical cataract   Vitreous mild Vitreous syneresis mild Vitreous syneresis       Fundus Exam      Right Left   Disc Pink and Sharp Pink and Sharp   C/D Ratio 0.55 0.6   Macula Flat, Good foveal reflex, mild Retinal pigment epithelial mottling, No heme or edema Flat, Good foveal reflex, mild Retinal pigment epithelial mottling, No heme or edema   Vessels Vascular attenuation, mild AV crossing changes Vascular attenuation, mild AV crossing changes   Periphery Attached, peripheral cystoid degeneration inferiorly, small break at 0900 - no SRF, area of focal lattice with atrophic hole at 1000, pigmented break at 0430 and 630 Attached, round hole at 0300 ora--no SRF -- light laser changes, not mature; patch of lattice at 0430 with atrophic holes almost to ora -- good laser surrounding, atrophic hole 0730 -- good laser          IMAGING AND PROCEDURES  Imaging and Procedures for @TODAY @  Repair Retinal Breaks, Laser - OD - Right Eye       LASER PROCEDURE NOTE  Procedure:  Barrier laser retinopexy using slit lamp laser, RIGHT eye   Diagnosis:   Retinal tears, right eye                     0430, 0630, 0900, and 1000 o'clock anterior to equator   Surgeon: Rennis Chris, MD, PhD  Anesthesia: Topical  Informed consent obtained, operative eye  marked, and time out performed prior to initiation of laser.   Laser settings:  Lumenis Smart532 laser, slit lamp Lens: Mainster PRP 165 Power: 240 mW Spot size: 200 microns Duration: 30 msec  # spots: 322  Placement of laser: Using a Mainster PRP 165 contact lens at the slit lamp, laser was placed in three confluent rows around retinal tears at 0430, 0630, 0900, and 1000 oclock anterior to equator with additional rows anteriorly.  Complications: None.  Patient tolerated the procedure well and received written and verbal post-procedure care information/education.                ASSESSMENT/PLAN:    ICD-10-CM   1. Retinal holes, bilateral H33.323 Repair Retinal Breaks, Laser - OD - Right Eye  2. Diabetes mellitus type 2 without retinopathy (HCC) E11.9   3. Retinal edema H35.81   4. Essential hypertension I10   5. Hypertensive retinopathy of both eyes H35.033   6. Combined forms of age-related cataract of both eyes H25.813     1. Retinal holes, OU   - OD: 0430, 0630, 0900, 1000 - OS: 0300, 0430, 0730 -- good laser surrounding - no SRF or RD - s/p laser retinopexy OS (11.04.19) -- good laser surrounding 0430 and 0730 holes; 0300 with light laser - recommend laser retinopexy OD today, 11.21.19 - RBA of procedure discussed, questions answered - informed consent obtained and signed - see procedure note - start PF QID x7 days OD - f/u in 2 wks for POV OD and possible touch up laser OS at 0300  2. Diabetes mellitus, type 2 without retinopathy OU - The incidence, risk factors  for progression, natural history and treatment options for diabetic retinopathy were discussed with patient.   - The need for close monitoring of blood glucose, blood pressure, and serum lipids, avoiding cigarette or any type of tobacco, and the need for long term follow up was also discussed with patient. - f/u in 1 year, sooner prn  3. No retinal edema on exam or OCT  4,5. Hypertensive retinopathy  OU - discussed importance of tight BP control - monitor  6. Mixed Cataracts OU - The symptoms of cataract, surgical options, and treatments and risks were discussed with patient. - discussed diagnosis and progression - not yet visually significant - monitor for now   Ophthalmic Meds Ordered this visit:  No orders of the defined types were placed in this encounter.      Return in about 2 weeks (around 10/29/2018) for f/u s/p laser OU.  There are no Patient Instructions on file for this visit.   Explained the diagnoses, plan, and follow up with the patient and they expressed understanding.  Patient expressed understanding of the importance of proper follow up care.   This document serves as a record of services personally performed by Karie Chimera, MD, PhD. It was created on their behalf by Laurian Brim, OA, an ophthalmic assistant. The creation of this record is the provider's dictation and/or activities during the visit.    Electronically signed by: Laurian Brim, OA  11.21.19 5:13 PM     Karie Chimera, M.D., Ph.D. Diseases & Surgery of the Retina and Vitreous [facility]  I have reviewed the above documentation for accuracy and completeness, and I agree with the above. Karie Chimera, M.D., Ph.D. 10/15/18 5:13 PM    Abbreviations: M myopia (nearsighted); A astigmatism; H hyperopia (farsighted); P presbyopia; Mrx spectacle prescription;  CTL contact lenses; OD right eye; OS left eye; OU both eyes  XT exotropia; ET esotropia; PEK punctate epithelial keratitis; PEE punctate epithelial erosions; DES dry eye syndrome; MGD meibomian gland dysfunction; ATs artificial tears; PFAT's preservative free artificial tears; NSC nuclear sclerotic cataract; PSC posterior subcapsular cataract; ERM epi-retinal membrane; PVD posterior vitreous detachment; RD retinal detachment; DM diabetes mellitus; DR diabetic retinopathy; NPDR non-proliferative diabetic retinopathy;  PDR proliferative diabetic retinopathy; CSME clinically significant macular edema; DME diabetic macular edema; dbh dot blot hemorrhages; CWS cotton wool spot; POAG primary open angle glaucoma; C/D cup-to-disc ratio; HVF humphrey visual field; GVF goldmann visual field; OCT optical coherence tomography; IOP intraocular pressure; BRVO Branch retinal vein occlusion; CRVO central retinal vein occlusion; CRAO central retinal artery occlusion; BRAO branch retinal artery occlusion; RT retinal tear; SB scleral buckle; PPV pars plana vitrectomy; VH Vitreous hemorrhage; PRP panretinal laser photocoagulation; IVK intravitreal kenalog; VMT vitreomacular traction; MH Macular hole;  NVD neovascularization of the disc; NVE neovascularization elsewhere; AREDS age related eye disease study; ARMD age related macular degeneration; POAG primary open angle glaucoma; EBMD epithelial/anterior basement membrane dystrophy; ACIOL anterior chamber intraocular lens; IOL intraocular lens; PCIOL posterior chamber intraocular lens; Phaco/IOL phacoemulsification with intraocular lens placement; PRK photorefractive keratectomy; LASIK laser assisted in situ keratomileusis; HTN hypertension; DM diabetes mellitus; COPD chronic obstructive pulmonary disease

## 2018-10-15 ENCOUNTER — Ambulatory Visit: Payer: BC Managed Care – PPO | Admitting: Orthopedic Surgery

## 2018-10-15 ENCOUNTER — Ambulatory Visit (INDEPENDENT_AMBULATORY_CARE_PROVIDER_SITE_OTHER): Payer: BC Managed Care – PPO | Admitting: Ophthalmology

## 2018-10-15 ENCOUNTER — Encounter (INDEPENDENT_AMBULATORY_CARE_PROVIDER_SITE_OTHER): Payer: Self-pay | Admitting: Ophthalmology

## 2018-10-15 DIAGNOSIS — I10 Essential (primary) hypertension: Secondary | ICD-10-CM

## 2018-10-15 DIAGNOSIS — H33323 Round hole, bilateral: Secondary | ICD-10-CM

## 2018-10-15 DIAGNOSIS — H35033 Hypertensive retinopathy, bilateral: Secondary | ICD-10-CM

## 2018-10-15 DIAGNOSIS — H3581 Retinal edema: Secondary | ICD-10-CM

## 2018-10-15 DIAGNOSIS — E119 Type 2 diabetes mellitus without complications: Secondary | ICD-10-CM

## 2018-10-15 DIAGNOSIS — H25813 Combined forms of age-related cataract, bilateral: Secondary | ICD-10-CM

## 2018-10-18 ENCOUNTER — Ambulatory Visit: Payer: BC Managed Care – PPO | Admitting: Orthopedic Surgery

## 2018-10-18 ENCOUNTER — Encounter: Payer: Self-pay | Admitting: Orthopedic Surgery

## 2018-10-18 VITALS — BP 199/109 | HR 84 | Ht 73.0 in | Wt 250.0 lb

## 2018-10-18 DIAGNOSIS — M4802 Spinal stenosis, cervical region: Secondary | ICD-10-CM | POA: Diagnosis not present

## 2018-10-18 DIAGNOSIS — M542 Cervicalgia: Secondary | ICD-10-CM

## 2018-10-18 NOTE — Progress Notes (Signed)
Chief Complaint  Patient presents with  . Neck Pain  . Medication Refill    "percs" not strong enough /patient states other medications unchanged and no list provided   52 year old male presented 6+ weeks ago with severe pain in his neck and right arm radiating to his right hand and we asked for MRI it was denied.  He was placed on Robaxin steroids opioid increased from hydrocodone to Percocet and did not get any relief presents back to the office still complaining of the same symptoms WITH TOO MUCH PAIN TO COMPLETE PT   Review of Systems  Constitutional: Negative for chills, fever and weight loss.  Eyes: Negative for blurred vision.  Musculoskeletal: Positive for back pain and neck pain.  Neurological: Positive for tingling, sensory change and focal weakness. Negative for weakness.   Past Medical History:  Diagnosis Date  . Benign prostatic hypertrophy   . Essential hypertension   . GERD (gastroesophageal reflux disease)   . Low back pain   . Noncompliance with medications   . Obesity   . OSA (obstructive sleep apnea)    Non-compliant with CPAP  . Type 2 diabetes mellitus (HCC)     BP (!) 199/109   Pulse 84   Ht 6\' 1"  (1.854 m)   Wt 250 lb (113.4 kg)   BMI 32.98 kg/m   Cervical spine primary tenderness is along the right paraspinal musculature with muscle spasm noted there decreased range of motion in all planes skin is normal.  Patient has a "short neck" pulse and perfusion normal bilaterally lymph nodes negative in the paracervical region right and left  Physical Exam  Neck: Spinous process tenderness and muscular tenderness present. Decreased range of motion present.  Musculoskeletal:       Arms: Neurological: He is alert. He displays no atrophy and no tremor. He exhibits abnormal muscle tone.  Reflex Scores:      Tricep reflexes are 2+ on the right side and 2+ on the left side.      Bicep reflexes are 2+ on the right side and 2+ on the left side.      Brachioradialis  reflexes are 2+ on the right side and 2+ on the left side.   CLINICAL DATA:  Back pain, right-sided neck pain   EXAM: CERVICAL SPINE - COMPLETE 4+ VIEW   COMPARISON:  None.   FINDINGS: There is no evidence of cervical spine fracture or prevertebral soft tissue swelling. Alignment is normal. No other significant bone abnormalities are identified. Degenerative disc disease with disc height loss at C2-3 and C4-5. Bilateral foraminal encroachment at C4-5.   IMPRESSION: No acute osseous injury of the cervical spine.     Electronically Signed   By: Elige KoHetal  Patel   On: 09/02/2018 14:51   Again MRI requested for cervical spine nerve impingement  He did not have his medication list today so he was advised to get it and send back to me for any prescriptions we also signed a pain contract with him for chronic pain related to his prior use of hydrocodone.  Encounter Diagnoses  Name Primary?  . Spinal stenosis in cervical region Yes  . Neck pain

## 2018-10-19 ENCOUNTER — Other Ambulatory Visit: Payer: Self-pay | Admitting: Orthopedic Surgery

## 2018-10-19 ENCOUNTER — Telehealth: Payer: Self-pay | Admitting: Radiology

## 2018-10-19 DIAGNOSIS — M4802 Spinal stenosis, cervical region: Secondary | ICD-10-CM

## 2018-10-19 MED ORDER — OXYCODONE-ACETAMINOPHEN 5-325 MG PO TABS: 1.0000 | ORAL_TABLET | Freq: Three times a day (TID) | ORAL | 0 refills | Status: DC | PRN

## 2018-10-19 MED ORDER — GABAPENTIN 300 MG PO CAPS: 300.0000 mg | ORAL_CAPSULE | Freq: Three times a day (TID) | ORAL | 1 refills | Status: DC

## 2018-10-19 NOTE — Progress Notes (Unsigned)
[email or web address] search completed   

## 2018-10-19 NOTE — Telephone Encounter (Signed)
Patient had [facility] send us a list of meds, I have reviewed and put in computer what he has had filled, he has not filled the Gabapentin,and the Celebrex was last filled on 09/02/18 for 20 d supply. The Ibuprofen was also last filled in October, so he is not likely on an NSAID   If you want to look at the list also and try to find out what he is taking, I have placed it on your desk beside your computer

## 2018-10-28 ENCOUNTER — Other Ambulatory Visit: Payer: Self-pay | Admitting: Radiology

## 2018-10-28 DIAGNOSIS — M4802 Spinal stenosis, cervical region: Secondary | ICD-10-CM

## 2018-10-28 NOTE — Telephone Encounter (Signed)
Patient called to request refill of Percocet / reminded him in the future he needs to call before noon on Thursday

## 2018-10-29 ENCOUNTER — Other Ambulatory Visit: Payer: Self-pay | Admitting: Orthopedic Surgery

## 2018-10-29 DIAGNOSIS — M4802 Spinal stenosis, cervical region: Secondary | ICD-10-CM

## 2018-10-29 MED ORDER — OXYCODONE-ACETAMINOPHEN 5-325 MG PO TABS: 1.0000 | ORAL_TABLET | Freq: Three times a day (TID) | ORAL | 0 refills | Status: DC | PRN

## 2018-10-29 NOTE — Progress Notes (Signed)
[facility] - Clinic Note  11/01/2018     CHIEF COMPLAINT Patient presents for Post-op Follow-up   HISTORY OF PRESENT ILLNESS: Geoffrey Jackson is a 52 y.o. male who presents to the clinic today for:   HPI    Post-op Follow-up    In both eyes.  Discomfort includes none.  Negative for pain, itching, foreign body sensation, tearing, discharge and floaters.  Vision is stable.  I, the attending physician,  performed the HPI with the patient and updated documentation appropriately.          Comments    Pt presents for retinal hole OU f/u (s/p laser OS 11.04 and OD 11.21) pt states his vision is doing okay, he denies new flashes or floaters, pt states he did okay with the laser last time and he is still using PF, pt did not check blood sugar today       Last edited by Rennis ChrisZamora, Derotha Fishbaugh, MD on 11/01/2018  2:39 PM. (History)    pt states he has not had any problems since last visit  Referring physician: Daisy Lazarotter, Mark, DO [address] [place], [place] [id number]  HISTORICAL INFORMATION:   Selected notes from the MEDICAL RECORD NUMBER Referred by Dr. Daisy LazarMark Cotter for concern of retinal hole OS LEE: 10.21.19 (M. Cotter) [BCVA: OD: 20/20- OS: 20/20-] Ocular Hx- PMH-DM (takes Toujeo and metformin), HTN    CURRENT MEDICATIONS: No current outpatient medications on file. (Ophthalmic Drugs)   No current facility-administered medications for this visit.  (Ophthalmic Drugs)   Current Outpatient Medications (Other)  Medication Sig  . albuterol (PROVENTIL HFA;VENTOLIN HFA) 108 (90 BASE) MCG/ACT inhaler Inhale 2 puffs into the lungs every 4 (four) hours as needed for wheezing or shortness of breath.  Marland Kitchen. amLODipine (NORVASC) 10 MG tablet Take 10 mg by mouth daily.  Marland Kitchen. aspirin 81 MG chewable tablet Chew 1 tablet (81 mg total) by mouth daily.  . celecoxib (CELEBREX) 100 MG capsule Take 1 capsule (100 mg total) by mouth 2 (two) times daily.  . cloNIDine (CATAPRES) 0.2 MG tablet  Take 0.2 mg by mouth 2 (two) times daily.  . dapagliflozin propanediol (FARXIGA) 10 MG TABS tablet Take 10 mg by mouth daily.  Marland Kitchen. gabapentin (NEURONTIN) 300 MG capsule Take 1 capsule (300 mg total) by mouth 3 (three) times daily.  Marland Kitchen. ibuprofen (ADVIL,MOTRIN) 800 MG tablet Take 1 tablet (800 mg total) by mouth every 8 (eight) hours as needed.  . Insulin Glargine (TOUJEO SOLOSTAR) 300 UNIT/ML SOPN Inject 50 Units into the skin at bedtime.  Marland Kitchen. labetalol (NORMODYNE) 200 MG tablet Take 1 tablet (200 mg total) by mouth 2 (two) times daily.  Marland Kitchen. LEVEMIR FLEXTOUCH 100 UNIT/ML Pen   . metFORMIN (GLUCOPHAGE) 500 MG tablet Take 500 mg by mouth 2 (two) times daily with a meal.   . methocarbamol (ROBAXIN) 500 MG tablet Take 1 tablet (500 mg total) by mouth 3 (three) times daily.  . nitroGLYCERIN (NITROSTAT) 0.4 MG SL tablet Place 1 tablet (0.4 mg total) under the tongue every 5 (five) minutes as needed for chest pain. (Patient not taking: Reported on 10/19/2018)  . olmesartan (BENICAR) 20 MG tablet Take 20 mg by mouth daily.  Marland Kitchen. oxyCODONE-acetaminophen (PERCOCET/ROXICET) 5-325 MG tablet Take 1 tablet by mouth every 8 (eight) hours as needed.  . potassium chloride SA (K-DUR,KLOR-CON) 20 MEQ tablet Take 1 tablet (20 mEq total) by mouth daily.  . pravastatin (PRAVACHOL) 40 MG tablet Take 40 mg by mouth daily.  .Marland Kitchen  tamsulosin (FLOMAX) 0.4 MG CAPS capsule Take 1 capsule by mouth daily.   No current facility-administered medications for this visit.  (Other)      REVIEW OF SYSTEMS: ROS    Positive for: Endocrine, Eyes   Negative for: Constitutional, Gastrointestinal, Neurological, Skin, Genitourinary, Musculoskeletal, HENT, Cardiovascular, Respiratory, Psychiatric, Allergic/Imm, Heme/Lymph   Last edited by Posey Boyer, COT on 11/01/2018  2:24 PM. (History)       ALLERGIES Allergies  Allergen Reactions  . Demerol [Meperidine] Itching  . Lansoprazole Other (See Comments)    unknown    PAST MEDICAL  HISTORY Past Medical History:  Diagnosis Date  . Benign prostatic hypertrophy   . Essential hypertension   . GERD (gastroesophageal reflux disease)   . Low back pain   . Noncompliance with medications   . Obesity   . OSA (obstructive sleep apnea)    Non-compliant with CPAP  . Type 2 diabetes mellitus (HCC)    Past Surgical History:  Procedure Laterality Date  . BICEPT TENODESIS Right 11/10/2014   Procedure: BICEPS TENODESIS;  Surgeon: [name], MD;  Location: [facility];  Service: Orthopedics;  Laterality: Right;  biceps tendon  . SHOULDER ARTHROSCOPY    . SHOULDER ARTHROSCOPY  05/21/2012   Procedure: ARTHROSCOPY SHOULDER;  Surgeon: [name], MD;  Location: [facility];  Service: Orthopedics;  Laterality: Right;  . SPINE SURGERY     Dr Danielle Dess   . SUBACROMIAL DECOMPRESSION  05/21/2012   Procedure: SUBACROMIAL DECOMPRESSION;  Surgeon: [name], MD;  Location: [facility];  Service: Orthopedics;  Laterality: Right;    FAMILY HISTORY Family History  Problem Relation Age of Onset  . Diabetes Father   . Hypertension Father   . Heart failure Father   . Kidney disease Father   . Diabetes Sister   . Hypertension Sister   . Hypertension Mother     SOCIAL HISTORY Social History   Tobacco Use  . Smoking status: Never Smoker  . Smokeless tobacco: Never Used  Substance Use Topics  . Alcohol use: Yes    Alcohol/week: 0.0 standard drinks    Comment: Occasionally  . Drug use: No         OPHTHALMIC EXAM:  Base Eye Exam    Visual Acuity (Snellen - Linear)      Right Left   Dist [place] 20/40 -1 20/60 -2   Dist ph [place] 20/25 -2 20/30 +2       Tonometry (Tonopen, 2:36 PM)      Right Left   Pressure 18 25       Tonometry #2      Right Left   Pressure  23       Pupils      Dark Light Shape React APD   Right 3 2 Round Brisk None   Left  3 2 Round Brisk None       Neuro/Psych    Oriented x3:  Yes   Mood/Affect:  Normal       Dilation    Both eyes:   1.0% Mydriacyl, 2.5% Phenylephrine @ 2:36 PM        Slit Lamp and Fundus Exam    Slit Lamp Exam      Right Left   Lids/Lashes mild Dermatochalasis - upper lid, mild Meibomian gland dysfunction mild Dermatochalasis - upper lid, mild Meibomian gland dysfunction   Conjunctiva/Sclera mild Melanosis mild Melanosis   Cornea Arcus, 1+ Punctate epithelial erosions Arcus, 1+ Punctate epithelial erosions  Anterior Chamber Deep and quiet Deep and quiet   Iris Round and dilated, No NVI Round and dilated, No NVI   Lens 1+ Nuclear sclerosis, 1-2+ Cortical cataract 1+ Nuclear sclerosis, 1-2+ Cortical cataract   Vitreous mild Vitreous syneresis mild Vitreous syneresis       Fundus Exam      Right Left   Disc Pink and Sharp Pink and Sharp   C/D Ratio 0.55 0.6   Macula Flat, Good foveal reflex, mild Retinal pigment epithelial mottling, No heme or edema Flat, Good foveal reflex, mild Retinal pigment epithelial mottling, No heme or edema   Vessels Vascular attenuation, mild AV crossing changes Vascular attenuation, mild AV crossing changes   Periphery Attached, peripheral cystoid degeneration inferiorly, small break at 0900 - no SRF, area of focal lattice with atrophic hole at 1000, pigmented break at 0430 and 630 -- good laser changes around all lesions Attached, round hole at 0300 ora--no SRF -- good laser changes, patch of lattice at 0430 with atrophic holes almost to ora -- good laser surrounding, atrophic hole 0730 -- good laser          IMAGING AND PROCEDURES  Imaging and Procedures for @TODAY @           ASSESSMENT/PLAN:    ICD-10-CM   1. 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 Diabetes mellitus type 2 without retinopathy (HCC) E11.9   3. Retinal edema H35.81 CANCELED: OCT, Retina - OU - Both Eyes  4. Essential hypertension I10   5. Hypertensive retinopathy of both eyes H35.033   6. Combined forms of age-related cataract of both eyes H25.813     1. Retinal holes, OU   - OD: 0430,  0630, 0900, 1000  - OS: 0300, 0430, 0730  - no SRF or RD - s/p laser retinopexy OS (11.04.19) -- good laser surrounding - s/p laser retinopexy OD (11.21.19) -- good laser surrounding - completed PF x 7 days - f/u 3 months, DFE OU, OCT  2. Diabetes mellitus, type 2 without retinopathy OU - The incidence, risk factors for progression, natural history and treatment options for diabetic retinopathy were discussed with patient.   - The need for close monitoring of blood glucose, blood pressure, and serum lipids, avoiding cigarette or any type of tobacco, and the need for long term follow up was also discussed with patient. - f/u in 1 year, sooner prn  3. No retinal edema on exam or OCT  4,5. Hypertensive retinopathy OU - discussed importance of tight BP control - monitor  6. Mixed Cataracts OU - The symptoms of cataract, surgical options, and treatments and risks were discussed with patient. - discussed diagnosis and progression - not yet visually significant - monitor for now   Ophthalmic Meds Ordered this visit:  No orders of the defined types were placed in this encounter.      Return in about 3 months (around 01/31/2019) for f/u retinal holes OU, DFE, OCT.  There are no Patient Instructions on file for this visit.   Explained the diagnoses, plan, and follow up with the patient and they expressed understanding.  Patient expressed understanding of the importance of proper follow up care.   This document serves as a record of services personally performed by Karie Chimera, MD, PhD. It was created on their behalf by Laurian Brim, OA, an ophthalmic assistant. The creation of this record is the provider's dictation and/or activities during the visit.    Electronically signed by: Laurian Brim, OA  12.05.19 2:41 PM    Karie Chimera, M.D., Ph.D. Diseases & Surgery of the Retina and Vitreous [facility]  I have reviewed the above documentation for  accuracy and completeness, and I agree with the above. Karie Chimera, M.D., Ph.D. 11/02/18 2:44 PM    Abbreviations: M myopia (nearsighted); A astigmatism; H hyperopia (farsighted); P presbyopia; Mrx spectacle prescription;  CTL contact lenses; OD right eye; OS left eye; OU both eyes  XT exotropia; ET esotropia; PEK punctate epithelial keratitis; PEE punctate epithelial erosions; DES dry eye syndrome; MGD meibomian gland dysfunction; ATs artificial tears; PFAT's preservative free artificial tears; NSC nuclear sclerotic cataract; PSC posterior subcapsular cataract; ERM epi-retinal membrane; PVD posterior vitreous detachment; RD retinal detachment; DM diabetes mellitus; DR diabetic retinopathy; NPDR non-proliferative diabetic retinopathy; PDR proliferative diabetic retinopathy; CSME clinically significant macular edema; DME diabetic macular edema; dbh dot blot hemorrhages; CWS cotton wool spot; POAG primary open angle glaucoma; C/D cup-to-disc ratio; HVF humphrey visual field; GVF goldmann visual field; OCT optical coherence tomography; IOP intraocular pressure; BRVO Branch retinal vein occlusion; CRVO central retinal vein occlusion; CRAO central retinal artery occlusion; BRAO branch retinal artery occlusion; RT retinal tear; SB scleral buckle; PPV pars plana vitrectomy; VH Vitreous hemorrhage; PRP panretinal laser photocoagulation; IVK intravitreal kenalog; VMT vitreomacular traction; MH Macular hole;  NVD neovascularization of the disc; NVE neovascularization elsewhere; AREDS age related eye disease study; ARMD age related macular degeneration; POAG primary open angle glaucoma; EBMD epithelial/anterior basement membrane dystrophy; ACIOL anterior chamber intraocular lens; IOL intraocular lens; PCIOL posterior chamber intraocular lens; Phaco/IOL phacoemulsification with intraocular lens placement; PRK photorefractive keratectomy; LASIK laser assisted in situ keratomileusis; HTN hypertension; DM diabetes  mellitus; COPD chronic obstructive pulmonary disease

## 2018-10-29 NOTE — Progress Notes (Signed)
[email or web address] search completed   

## 2018-10-31 ENCOUNTER — Ambulatory Visit
Admission: RE | Admit: 2018-10-31 | Discharge: 2018-10-31 | Disposition: A | Discharge status: Home / Self Care | Payer: BC Managed Care – PPO | Source: Ambulatory Visit | Attending: Orthopedic Surgery | Admitting: Orthopedic Surgery

## 2018-10-31 DIAGNOSIS — M542 Cervicalgia: Secondary | ICD-10-CM

## 2018-10-31 DIAGNOSIS — M4802 Spinal stenosis, cervical region: Secondary | ICD-10-CM

## 2018-11-01 ENCOUNTER — Ambulatory Visit (INDEPENDENT_AMBULATORY_CARE_PROVIDER_SITE_OTHER): Payer: BC Managed Care – PPO | Admitting: Ophthalmology

## 2018-11-01 ENCOUNTER — Encounter (INDEPENDENT_AMBULATORY_CARE_PROVIDER_SITE_OTHER): Payer: Self-pay | Admitting: Ophthalmology

## 2018-11-01 DIAGNOSIS — H25813 Combined forms of age-related cataract, bilateral: Secondary | ICD-10-CM

## 2018-11-01 DIAGNOSIS — H35033 Hypertensive retinopathy, bilateral: Secondary | ICD-10-CM

## 2018-11-01 DIAGNOSIS — H33323 Round hole, bilateral: Secondary | ICD-10-CM

## 2018-11-01 DIAGNOSIS — E119 Type 2 diabetes mellitus without complications: Secondary | ICD-10-CM

## 2018-11-01 DIAGNOSIS — I10 Essential (primary) hypertension: Secondary | ICD-10-CM

## 2018-11-01 DIAGNOSIS — H3581 Retinal edema: Secondary | ICD-10-CM

## 2018-11-02 ENCOUNTER — Encounter (INDEPENDENT_AMBULATORY_CARE_PROVIDER_SITE_OTHER): Payer: Self-pay | Admitting: Ophthalmology

## 2018-11-03 ENCOUNTER — Encounter: Payer: Self-pay | Admitting: Orthopedic Surgery

## 2018-11-03 ENCOUNTER — Ambulatory Visit: Payer: BC Managed Care – PPO | Admitting: Orthopedic Surgery

## 2018-11-03 VITALS — BP 181/98 | HR 89 | Ht 73.0 in | Wt 255.0 lb

## 2018-11-03 DIAGNOSIS — M4802 Spinal stenosis, cervical region: Secondary | ICD-10-CM | POA: Diagnosis not present

## 2018-11-03 MED ORDER — OXYCODONE-ACETAMINOPHEN 5-325 MG PO TABS: 1.0000 | ORAL_TABLET | Freq: Three times a day (TID) | ORAL | 0 refills | Status: DC | PRN

## 2018-11-03 NOTE — Progress Notes (Addendum)
FOLLOW UP VISIT : MRI RESULTS   Chief Complaint  Patient presents with  . Neck Pain  . Results    review MRI      HPI: The patient is here TO DISCUSS THE RESULTS OF MRI  52 year old male who comes in for discussion of his MRI.  I saw him in October and he presented with severe pain in his neck and right arm radiating to his right hand and we asked for MRI was denied he was placed on Robaxin and steroids as well as gabapentin and his opioid was increased from hydrocodone which he takes on a chronic basis for chronic right shoulder pain to Percocet.  Should be noted he did not get much relief we eventually obtained his MRI after 6 weeks of conservative treatment and his MRI shows that he has severe spinal stenosis at C4-5 with osteophytes from C2-C6 and bulging disc at C6-7 however more concerning is a 7 mm AP diameter of C4-5 with spondylosis and endplate osteophyte bulging disc bilateral facet osteoarthritis and canal narrowing of the 7 mm as stated there is also spondylosis and bulging disc with canal narrowing at C5-6 6 of only 8 mm     Review of Systems  Musculoskeletal: Positive for back pain.       He has had mid to upper back surgery I believe or may have been his thoracic spine in the past and he does have some lower back pain as well  Neurological: Positive for tingling and sensory change. Negative for focal weakness and weakness.     BP (!) 181/98   Pulse 89   Ht 6\' 1"  (1.854 m)   Wt 255 lb (115.7 kg)   BMI 33.64 kg/m     Medical decision-making section   DATA C2-3: Anterior osteophytes. No disc bulge or herniation. No canal or foraminal stenosis.   C3-4: Endplate osteophytes and bulging of the disc. Narrowing of the ventral subarachnoid space but no compressive effect upon the cord. No foraminal stenosis.   C4-5: Spondylosis with endplate osteophytes and bulging of the disc. Bilateral facet osteoarthritis. Canal narrowing with AP diameter of the canal 7 mm in  the midline. Bilateral foraminal stenosis that could affect either C5 nerve.   C5-6: Spondylosis with endplate osteophytes and bulging of the disc. Canal narrowing with AP diameter 8 mm in the midline. Mild foraminal narrowing without likely neural compression.   C6-7: Mild bulging of the disc.  No canal or foraminal stenosis.   C7-T1: Normal.   IMPRESSION: C4-5: Endplate osteophytes and bulging of the disc resulting in spinal stenosis with AP diameter of the canal only 7 mm. Bilateral facet osteoarthritis. Bilateral foraminal narrowing that could affect either C5 nerve.   C3-4, C5-6 and C6-7 disc bulges but without compressive stenosis.     Electronically Signed   By: Paulina FusiMark  Shogry M.D.   On: 10/31/2018 13:27 I reviewed this MRI with him and I agree that he has severe stenosis at C4-C5 with foraminal foraminal stenosis at C5 and 4  Encounter Diagnosis  Name Primary?  . Spinal stenosis in cervical region Yes    Meds ordered this encounter  Medications  . oxyCODONE-acetaminophen (PERCOCET/ROXICET) 5-325 MG tablet    Sig: Take 1 tablet by mouth every 8 (eight) hours as needed.    Dispense:  21 tablet    Refill:  0  \\  Referral made to Dr. Franky Machoabbell

## 2018-11-03 NOTE — Patient Instructions (Signed)
Spinal Stenosis Spinal stenosis occurs when the open space (spinal canal) between the bones of your spine (vertebrae) narrows, putting pressure on the spinal cord or nerves. What are the causes? This condition is caused by areas of bone pushing into the central canals of your vertebrae. This condition may be present at birth (congenital), or it may be caused by:  Arthritic deterioration of your vertebrae (spinal degeneration). This usually starts around age 50.  Injury or trauma to the spine.  Tumors in the spine.  Calcium deposits in the spine.  What are the signs or symptoms? Symptoms of this condition include:  Pain in the neck or back that is generally worse with activities, particularly when standing and walking.  Numbness, tingling, hot or cold sensations, weakness, or weariness in your legs.  Pain going up and down the leg (sciatica).  Frequent episodes of falling.  A foot-slapping gait that leads to muscle weakness.  In more serious cases, you may develop:  Problemspassing stool or passing urine.  Difficulty having sex.  Loss of feeling in part or all of your leg.  Symptoms may come on slowly and get worse over time. How is this diagnosed? This condition is diagnosed based on your medical history and a physical exam. Tests will also be done, such as:  MRI.  CT scan.  X-ray.  How is this treated? Treatment for this condition often focuses on managing your pain and any other symptoms. Treatment may include:  Practicing good posture to lessen pressure on your nerves.  Exercising to strengthen muscles, build endurance, improve balance, and maintain good joint movement (range of motion).  Losing weight, if needed.  Taking medicines to reduce swelling, inflammation, or pain.  Assistive devices, such as a corset or brace.  In some cases, surgery may be needed. The most common procedure is decompression laminectomy. This is done to remove excess bone that  puts pressure on your nerve roots. Follow these instructions at home: Managing pain, stiffness, and swelling  Do all exercises and stretches as told by your health care provider.  Practice good posture. If you were given a brace or a corset, wear it as told by your health care provider.  Do not do any activities that cause pain. Ask your health care provider what activities are safe for you.  Do not lift anything that is heavier than 10 lb (4.5 kg) or the limit that your health care provider tells you.  Maintain a healthy weight. Talk with your health care provider if you need help losing weight.  If directed, apply heat to the affected area as often as told by your health care provider. Use the heat source that your health care provider recommends, such as a moist heat pack or a heating pad. ? Place a towel between your skin and the heat source. ? Leave the heat on for 20-30 minutes. ? Remove the heat if your skin turns bright red. This is especially important if you are not able to feel pain, heat, or cold. You may have a greater risk of getting burned. General instructions  Take over-the-counter and prescription medicines only as told by your health care provider.  Do not use any products that contain nicotine or tobacco, such as cigarettes and e-cigarettes. If you need help quitting, ask your health care provider.  Eat a healthy diet. This includes plenty of fruits and vegetables, whole grains, and low-fat (lean) protein.  Keep all follow-up visits as told by your health care provider.   This is important. Contact a health care provider if:  Your symptoms do not get better or they get worse.  You have a fever. Get help right away if:  You have new or worse pain in your neck or upper back.  You have severe pain that cannot be controlled with medicines.  You are dizzy.  You have vision problems, blurred vision, or double vision.  You have a severe headache that is worse when  you stand.  You have nausea or you vomit.  You develop new or worse numbness or tingling in your back or legs.  You have pain, redness, swelling, or warmth in your arm or leg. Summary  Spinal stenosis occurs when the open space (spinal canal) between the bones of your spine (vertebrae) narrows. This narrowing puts pressure on the spinal cord or nerves.  Spinal stenosis can cause numbness, weakness, or pain in the neck, back, and legs.  This condition may be caused by a birth defect, arthritic deterioration of your vertebrae, injury, tumors, or calcium deposits.  This condition is usually diagnosed with MRIs, CT scans, and X-rays. This information is not intended to replace advice given to you by your health care provider. Make sure you discuss any questions you have with your health care provider. Document Released: 01/31/2004 Document Revised: 10/15/2016 Document Reviewed: 10/15/2016 Elsevier Interactive Patient Education  2018 [employer]vier Inc.  

## 2018-11-11 ENCOUNTER — Other Ambulatory Visit: Payer: Self-pay | Admitting: Orthopedic Surgery

## 2018-11-11 DIAGNOSIS — M4802 Spinal stenosis, cervical region: Secondary | ICD-10-CM

## 2018-11-11 NOTE — Telephone Encounter (Signed)
He requests refill Oxycodone,   He did see Dr Franky Machoabbell on Tues, will have surgery

## 2018-11-11 NOTE — Telephone Encounter (Signed)
Oxycodone-Acetaminophen 5/325 mg  Qty 21 Tablets  Take 1 tablet by mouth every 8 (eight) hours as needed.  [facility]

## 2018-11-15 ENCOUNTER — Other Ambulatory Visit: Payer: Self-pay | Admitting: Neurosurgery

## 2018-11-15 DIAGNOSIS — M5489 Other dorsalgia: Secondary | ICD-10-CM

## 2018-11-19 ENCOUNTER — Other Ambulatory Visit: Payer: Self-pay | Admitting: Orthopedic Surgery

## 2018-11-19 DIAGNOSIS — M4802 Spinal stenosis, cervical region: Secondary | ICD-10-CM

## 2018-11-19 NOTE — Telephone Encounter (Signed)
Oxycodone-Acetaminophen 5/325 mg  Qty 21 Tablets ° °Take 1 tablet by mouth every 8 (eight) hours as needed. ° °PATIENT USES [place] [facility] °

## 2018-11-24 NOTE — Telephone Encounter (Signed)
Must call neurosurgery for pain meds

## 2018-11-25 NOTE — Telephone Encounter (Signed)
I have advised him. He is having neck surgery next week. He said you were to prescribe for him until he has the surgery, then the surgeon would take over prescribing.

## 2018-11-26 MED ORDER — OXYCODONE-ACETAMINOPHEN 5-325 MG PO TABS: 1.0000 | ORAL_TABLET | Freq: Three times a day (TID) | ORAL | 0 refills | Status: DC | PRN

## 2018-11-26 NOTE — Telephone Encounter (Signed)
Can we send it the other way

## 2018-11-27 ENCOUNTER — Ambulatory Visit
Admission: RE | Admit: 2018-11-27 | Discharge: 2018-11-27 | Disposition: A | Discharge status: Home / Self Care | Payer: BC Managed Care – PPO | Source: Ambulatory Visit | Attending: Neurosurgery | Admitting: Neurosurgery

## 2018-11-27 DIAGNOSIS — M5489 Other dorsalgia: Secondary | ICD-10-CM

## 2018-11-27 MED ORDER — GADOBENATE DIMEGLUMINE 529 MG/ML IV SOLN
20.0000 mL | Freq: Once | INTRAVENOUS | Status: AC | PRN
  Administered 2018-11-27: 20 mL via INTRAVENOUS

## 2018-12-08 ENCOUNTER — Other Ambulatory Visit: Payer: Self-pay | Admitting: Orthopedic Surgery

## 2018-12-08 DIAGNOSIS — M4802 Spinal stenosis, cervical region: Secondary | ICD-10-CM

## 2018-12-08 MED ORDER — OXYCODONE-ACETAMINOPHEN 5-325 MG PO TABS: 1.0000 | ORAL_TABLET | Freq: Three times a day (TID) | ORAL | 0 refills | Status: DC | PRN

## 2018-12-08 NOTE — Telephone Encounter (Signed)
Patient requests refill on Oxycodone/Acetaminophen 5-325 mgs.  Qty  21  Sig: Take 1 tablet by mouth every 8 (eight) hours as needed.  Patient states he uses [facility] in [place].   In addition to medication request, he also wants Dr. Romeo Apple to know that he is having neck surgery in June.  Thanks

## 2018-12-08 NOTE — Telephone Encounter (Signed)
Send him to pain management   This is the last script I can do

## 2018-12-09 NOTE — Telephone Encounter (Signed)
Called patient, made referral

## 2018-12-09 NOTE — Addendum Note (Signed)
Addended byCaffie Damme on: 12/09/2018 10:34 AM   Modules accepted: Orders

## 2018-12-14 ENCOUNTER — Encounter: Payer: Self-pay | Admitting: Orthopedic Surgery

## 2019-05-08 ENCOUNTER — Other Ambulatory Visit: Payer: Self-pay

## 2019-05-08 ENCOUNTER — Emergency Department (HOSPITAL_COMMUNITY): Payer: BC Managed Care – PPO

## 2019-05-08 ENCOUNTER — Encounter (HOSPITAL_COMMUNITY): Payer: Self-pay | Admitting: *Deleted

## 2019-05-08 ENCOUNTER — Inpatient Hospital Stay (HOSPITAL_COMMUNITY)
Admission: EM | Admit: 2019-05-08 | Discharge: 2019-05-10 | DRG: 921 | Disposition: A | Discharge status: Home / Self Care | Payer: BC Managed Care – PPO | Attending: Neurosurgery | Admitting: Neurosurgery

## 2019-05-08 DIAGNOSIS — E119 Type 2 diabetes mellitus without complications: Secondary | ICD-10-CM | POA: Diagnosis present

## 2019-05-08 DIAGNOSIS — Z9889 Other specified postprocedural states: Secondary | ICD-10-CM

## 2019-05-08 DIAGNOSIS — R29898 Other symptoms and signs involving the musculoskeletal system: Secondary | ICD-10-CM

## 2019-05-08 DIAGNOSIS — Z20828 Contact with and (suspected) exposure to other viral communicable diseases: Secondary | ICD-10-CM | POA: Diagnosis present

## 2019-05-08 DIAGNOSIS — E785 Hyperlipidemia, unspecified: Secondary | ICD-10-CM | POA: Diagnosis present

## 2019-05-08 DIAGNOSIS — Z7982 Long term (current) use of aspirin: Secondary | ICD-10-CM

## 2019-05-08 DIAGNOSIS — R531 Weakness: Secondary | ICD-10-CM | POA: Diagnosis present

## 2019-05-08 DIAGNOSIS — G4733 Obstructive sleep apnea (adult) (pediatric): Secondary | ICD-10-CM | POA: Diagnosis present

## 2019-05-08 DIAGNOSIS — G8918 Other acute postprocedural pain: Secondary | ICD-10-CM

## 2019-05-08 DIAGNOSIS — T8189XA Other complications of procedures, not elsewhere classified, initial encounter: Principal | ICD-10-CM | POA: Diagnosis present

## 2019-05-08 DIAGNOSIS — Z8249 Family history of ischemic heart disease and other diseases of the circulatory system: Secondary | ICD-10-CM

## 2019-05-08 DIAGNOSIS — Z794 Long term (current) use of insulin: Secondary | ICD-10-CM

## 2019-05-08 DIAGNOSIS — Z833 Family history of diabetes mellitus: Secondary | ICD-10-CM

## 2019-05-08 DIAGNOSIS — Z841 Family history of disorders of kidney and ureter: Secondary | ICD-10-CM

## 2019-05-08 DIAGNOSIS — N400 Enlarged prostate without lower urinary tract symptoms: Secondary | ICD-10-CM | POA: Diagnosis present

## 2019-05-08 DIAGNOSIS — I10 Essential (primary) hypertension: Secondary | ICD-10-CM | POA: Diagnosis present

## 2019-05-08 DIAGNOSIS — Z885 Allergy status to narcotic agent status: Secondary | ICD-10-CM

## 2019-05-08 DIAGNOSIS — R1310 Dysphagia, unspecified: Secondary | ICD-10-CM

## 2019-05-08 DIAGNOSIS — K219 Gastro-esophageal reflux disease without esophagitis: Secondary | ICD-10-CM | POA: Diagnosis present

## 2019-05-08 LAB — BASIC METABOLIC PANEL
Anion gap: 9 (ref 5–15)
BUN: 12 mg/dL (ref 6–20)
CO2: 25 mmol/L (ref 22–32)
Calcium: 9.9 mg/dL (ref 8.9–10.3)
Chloride: 105 mmol/L (ref 98–111)
Creatinine, Ser: 1.02 mg/dL (ref 0.61–1.24)
GFR calc Af Amer: >60 mL/min (ref >60)
GFR calc non Af Amer: >60 mL/min (ref >60)
Glucose, Bld: 234 mg/dL — ABNORMAL HIGH (ref 70–99)
Potassium: 3.3 mmol/L — ABNORMAL LOW (ref 3.5–5.1)
Sodium: 139 mmol/L (ref 135–145)

## 2019-05-08 LAB — CBC WITH DIFFERENTIAL/PLATELET
Abs Immature Granulocytes: 0.02 10*3/uL (ref 0.00–0.07)
Basophils Absolute: 0 10*3/uL (ref 0.0–0.1)
Basophils Relative: 1 %
Eosinophils Absolute: 0.1 10*3/uL (ref 0.0–0.5)
Eosinophils Relative: 1 %
HCT: 47.1 % (ref 39.0–52.0)
Hemoglobin: 15.3 g/dL (ref 13.0–17.0)
Immature Granulocytes: 0 %
Lymphocytes Relative: 29 %
Lymphs Abs: 1.7 10*3/uL (ref 0.7–4.0)
MCH: 29.9 pg (ref 26.0–34.0)
MCHC: 32.5 g/dL (ref 30.0–36.0)
MCV: 92.2 fL (ref 80.0–100.0)
Monocytes Absolute: 0.5 10*3/uL (ref 0.1–1.0)
Monocytes Relative: 9 %
Neutro Abs: 3.7 10*3/uL (ref 1.7–7.7)
Neutrophils Relative %: 60 %
Platelets: 221 10*3/uL (ref 150–400)
RBC: 5.11 MIL/uL (ref 4.22–5.81)
RDW: 12.9 % (ref 11.5–15.5)
WBC: 6.1 10*3/uL (ref 4.0–10.5)
nRBC: 0 % (ref 0.0–0.2)

## 2019-05-08 LAB — GLUCOSE, CAPILLARY: Glucose-Capillary: 208 mg/dL — ABNORMAL HIGH (ref 70–99)

## 2019-05-08 MED ORDER — DEXAMETHASONE SODIUM PHOSPHATE 4 MG/ML IJ SOLN
4.0000 mg | Freq: Four times a day (QID) | INTRAMUSCULAR | Status: DC
  Administered 2019-05-08 – 2019-05-10 (×6): 4 mg via INTRAVENOUS
  Filled 2019-05-08 (×6): qty 1

## 2019-05-08 MED ORDER — PRAVASTATIN SODIUM 40 MG PO TABS
40.0000 mg | ORAL_TABLET | Freq: Every day | ORAL | Status: DC
  Administered 2019-05-09 – 2019-05-10 (×2): 40 mg via ORAL
  Filled 2019-05-08 (×2): qty 1

## 2019-05-08 MED ORDER — ACETAMINOPHEN 325 MG PO TABS: 650.0000 mg | ORAL_TABLET | Freq: Four times a day (QID) | ORAL | Status: DC | PRN

## 2019-05-08 MED ORDER — SODIUM CHLORIDE 0.9% FLUSH: 3.0000 mL | INTRAVENOUS | Status: DC | PRN

## 2019-05-08 MED ORDER — ZOLPIDEM TARTRATE 5 MG PO TABS: 5.0000 mg | ORAL_TABLET | Freq: Every evening | ORAL | Status: DC | PRN

## 2019-05-08 MED ORDER — LABETALOL HCL 5 MG/ML IV SOLN: 10.0000 mg | INTRAVENOUS | Status: DC | PRN

## 2019-05-08 MED ORDER — HYDRALAZINE HCL 20 MG/ML IJ SOLN: 5.0000 mg | INTRAMUSCULAR | Status: DC | PRN

## 2019-05-08 MED ORDER — METFORMIN HCL 500 MG PO TABS
500.0000 mg | ORAL_TABLET | Freq: Two times a day (BID) | ORAL | Status: DC
  Administered 2019-05-09 – 2019-05-10 (×3): 500 mg via ORAL
  Filled 2019-05-08 (×3): qty 1

## 2019-05-08 MED ORDER — SODIUM CHLORIDE 0.9 % IV SOLN: 250.0000 mL | INTRAVENOUS | Status: DC | PRN

## 2019-05-08 MED ORDER — POTASSIUM CHLORIDE CRYS ER 20 MEQ PO TBCR
20.0000 meq | EXTENDED_RELEASE_TABLET | Freq: Every day | ORAL | Status: DC
  Administered 2019-05-09 – 2019-05-10 (×2): 20 meq via ORAL
  Filled 2019-05-08 (×2): qty 1

## 2019-05-08 MED ORDER — ALBUTEROL SULFATE (2.5 MG/3ML) 0.083% IN NEBU: 2.5000 mg | INHALATION_SOLUTION | RESPIRATORY_TRACT | Status: DC | PRN

## 2019-05-08 MED ORDER — HYDROCODONE-ACETAMINOPHEN 5-325 MG PO TABS: 1.0000 | ORAL_TABLET | ORAL | Status: DC | PRN

## 2019-05-08 MED ORDER — LABETALOL HCL 200 MG PO TABS
200.0000 mg | ORAL_TABLET | Freq: Two times a day (BID) | ORAL | Status: DC
  Administered 2019-05-08 – 2019-05-10 (×4): 200 mg via ORAL
  Filled 2019-05-08 (×4): qty 1

## 2019-05-08 MED ORDER — SENNOSIDES-DOCUSATE SODIUM 8.6-50 MG PO TABS: 1.0000 | ORAL_TABLET | Freq: Every evening | ORAL | Status: DC | PRN

## 2019-05-08 MED ORDER — AMLODIPINE BESYLATE 5 MG PO TABS
10.0000 mg | ORAL_TABLET | Freq: Every day | ORAL | Status: DC
  Administered 2019-05-09 – 2019-05-10 (×2): 10 mg via ORAL
  Filled 2019-05-08 (×2): qty 2

## 2019-05-08 MED ORDER — IRBESARTAN 150 MG PO TABS
150.0000 mg | ORAL_TABLET | Freq: Every day | ORAL | Status: DC
  Administered 2019-05-09 – 2019-05-10 (×2): 150 mg via ORAL
  Filled 2019-05-08 (×2): qty 1

## 2019-05-08 MED ORDER — HYDROMORPHONE HCL 1 MG/ML IJ SOLN: 1.0000 mg | INTRAMUSCULAR | Status: DC | PRN

## 2019-05-08 MED ORDER — ONDANSETRON HCL 4 MG/2ML IJ SOLN: 4.0000 mg | Freq: Four times a day (QID) | INTRAMUSCULAR | Status: DC | PRN

## 2019-05-08 MED ORDER — DEXAMETHASONE SODIUM PHOSPHATE 10 MG/ML IJ SOLN
10.0000 mg | Freq: Once | INTRAMUSCULAR | Status: AC
  Administered 2019-05-08: 10 mg via INTRAVENOUS
  Filled 2019-05-08: qty 1

## 2019-05-08 MED ORDER — INSULIN ASPART 100 UNIT/ML ~~LOC~~ SOLN
0.0000 [IU] | Freq: Every day | SUBCUTANEOUS | Status: DC
  Administered 2019-05-08: 2 [IU] via SUBCUTANEOUS
  Administered 2019-05-10: 3 [IU] via SUBCUTANEOUS

## 2019-05-08 MED ORDER — ONDANSETRON HCL 4 MG PO TABS: 4.0000 mg | ORAL_TABLET | Freq: Four times a day (QID) | ORAL | Status: DC | PRN

## 2019-05-08 MED ORDER — BISACODYL 5 MG PO TBEC: 5.0000 mg | DELAYED_RELEASE_TABLET | Freq: Every day | ORAL | Status: DC | PRN

## 2019-05-08 MED ORDER — DOCUSATE SODIUM 100 MG PO CAPS
100.0000 mg | ORAL_CAPSULE | Freq: Two times a day (BID) | ORAL | Status: DC
  Administered 2019-05-08 – 2019-05-10 (×4): 100 mg via ORAL
  Filled 2019-05-08 (×4): qty 1

## 2019-05-08 MED ORDER — SODIUM CHLORIDE 0.9 % IV SOLN
INTRAVENOUS | Status: DC
  Administered 2019-05-09 (×2): via INTRAVENOUS

## 2019-05-08 MED ORDER — TAMSULOSIN HCL 0.4 MG PO CAPS
0.4000 mg | ORAL_CAPSULE | Freq: Every day | ORAL | Status: DC
  Administered 2019-05-09 – 2019-05-10 (×2): 0.4 mg via ORAL
  Filled 2019-05-08 (×2): qty 1

## 2019-05-08 MED ORDER — SODIUM CHLORIDE 0.9% FLUSH
3.0000 mL | Freq: Two times a day (BID) | INTRAVENOUS | Status: DC
  Administered 2019-05-08 – 2019-05-10 (×4): 3 mL via INTRAVENOUS

## 2019-05-08 MED ORDER — ACETAMINOPHEN 650 MG RE SUPP: 650.0000 mg | Freq: Four times a day (QID) | RECTAL | Status: DC | PRN

## 2019-05-08 MED ORDER — INSULIN ASPART 100 UNIT/ML ~~LOC~~ SOLN
0.0000 [IU] | Freq: Three times a day (TID) | SUBCUTANEOUS | Status: DC
  Administered 2019-05-09 (×2): 15 [IU] via SUBCUTANEOUS
  Administered 2019-05-09: 11 [IU] via SUBCUTANEOUS
  Administered 2019-05-10: 7 [IU] via SUBCUTANEOUS

## 2019-05-08 MED ORDER — NITROGLYCERIN 0.4 MG SL SUBL: 0.4000 mg | SUBLINGUAL_TABLET | SUBLINGUAL | Status: DC | PRN

## 2019-05-08 MED ORDER — FLEET ENEMA 7-19 GM/118ML RE ENEM: 1.0000 | ENEMA | Freq: Once | RECTAL | Status: DC | PRN

## 2019-05-08 MED ORDER — GABAPENTIN 300 MG PO CAPS
300.0000 mg | ORAL_CAPSULE | Freq: Three times a day (TID) | ORAL | Status: DC
  Administered 2019-05-08 – 2019-05-10 (×5): 300 mg via ORAL
  Filled 2019-05-08 (×5): qty 1

## 2019-05-08 MED ORDER — CLONIDINE HCL 0.2 MG PO TABS
0.2000 mg | ORAL_TABLET | Freq: Two times a day (BID) | ORAL | Status: DC
  Administered 2019-05-09 – 2019-05-10 (×3): 0.2 mg via ORAL
  Filled 2019-05-08 (×2): qty 1
  Filled 2019-05-08 (×4): qty 2
  Filled 2019-05-08: qty 1

## 2019-05-08 NOTE — H&P (Signed)
Chief Complaint   Chief Complaint  Patient presents with  . Post-op Problem    HPI   HPI: Geoffrey Jackson is a 53 y.o. 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 Surgery was performed at the outpatient surgery center with same day discharge. Prior to surgery, Geoffrey Jackson had significant RUE pain as well as numbness and tingling.  This resolved with surgery. Unfortunately when he got home, he noticed significant 10/10 LUE pain and weakness in left arm, particularly the shoulder. Also noted mild tremor in LUE when lifting. He was taking flexeril 10mg  TID and oxycodone 5mg  q 6 hours without relief. In addition to LUE pain and weakness he also noticed dysphagia. Initially he only had difficulties with solid foods, but it slowly progressed to involve liquids as well. He describes feeling as though he is going to choke whenever eating/drinking. In fact, he reports several episodes where he would drink water and then essentially regurgitate the water back up. He currently denies fever, chills, N/V, headache, dizziness, chest pain/tightness, shortness of breath, cough, RUE pain, BLE symptoms, bowel/bladder dysfunction.  He does have a history of diabetes, hyperlipidemia, hypertension, BPH.  Diabetes: Blood sugars range from 120-200. Never exceed 200.    Patient Active Problem List   Diagnosis Date Noted  . Dysphagia 05/08/2019  . Syncope 07/30/2017  . Nausea, vomiting, and diarrhea 12/17/2015  . Headache 12/17/2015  . Hypertensive emergency 12/17/2015  . CAP (community acquired pneumonia) 06/15/2015  . Elevated troponin I level 06/15/2015  . Biceps tendonitis on right   . Tear of biceps muscle 10/25/2013  . Right shoulder pain 10/25/2013  . Chest pain 04/13/2013  . Diabetes mellitus type 2 in obese (HCC) 02/09/2007  . Hyperlipidemia 12/08/2006  . Obesity 12/08/2006  . Essential  hypertension 12/08/2006  . GERD 12/08/2006  . BENIGN PROSTATIC HYPERTROPHY 12/08/2006    PMH: Past Medical History:  Diagnosis Date  . Benign prostatic hypertrophy   . Essential hypertension   . GERD (gastroesophageal reflux disease)   . Low back pain   . Noncompliance with medications   . Obesity   . OSA (obstructive sleep apnea)    Non-compliant with CPAP  . Type 2 diabetes mellitus (HCC)     PSH: Past Surgical History:  Procedure Laterality Date  . BICEPT TENODESIS Right 11/10/2014   Procedure: BICEPS TENODESIS;  Surgeon: Vickki HearingStanley E Harrison, MD;  Location: [facility];  Service: Orthopedics;  Laterality: Right;  biceps tendon  . SHOULDER ARTHROSCOPY    . SHOULDER ARTHROSCOPY  05/21/2012   Procedure: ARTHROSCOPY SHOULDER;  Surgeon: Vickki HearingStanley E Harrison, MD;  Location: [facility];  Service: Orthopedics;  Laterality: Right;  . SPINE SURGERY     Dr Danielle DessElsner   . SUBACROMIAL DECOMPRESSION  05/21/2012   Procedure: SUBACROMIAL DECOMPRESSION;  Surgeon: Vickki HearingStanley E Harrison, MD;  Location: [facility];  Service: Orthopedics;  Laterality: Right;    (Not in a hospital admission)   SH: Social History   Tobacco Use  . Smoking status: Never Smoker  . Smokeless tobacco: Never Used  Substance Use Topics  . Alcohol use: Yes    Alcohol/week: 0.0 standard drinks    Comment: Occasionally  . Drug use: No    MEDS: Prior to Admission medications   Medication Sig Start Date End Date Taking? Authorizing Provider  albuterol (PROVENTIL HFA;VENTOLIN HFA) 108 (90 BASE) MCG/ACT inhaler Inhale 2 puffs into the  lungs every 4 (four) hours as needed for wheezing or shortness of breath. 06/21/15  Yes Idol, Raynelle FanningJulie, PA-C  amLODipine (NORVASC) 10 MG tablet Take 10 mg by mouth daily. 11/15/15   [provider]  aspirin 81 MG chewable tablet Chew 1 tablet (81 mg total) by mouth daily. 08/02/17   Oval Linseyondiego, Richard, MD  celecoxib (CELEBREX) 100 MG capsule Take 1 capsule (100 mg total) by mouth 2 (two) times daily.  09/02/18   Bethann BerkshireZammit, Joseph, MD  cloNIDine (CATAPRES) 0.2 MG tablet Take 0.2 mg by mouth 2 (two) times daily. 04/28/18   [provider]  cyclobenzaprine (FLEXERIL) 10 MG tablet Take 10 mg by mouth 3 (three) times daily as needed for muscle spasms. 03/28/19   [provider]  dapagliflozin propanediol (FARXIGA) 10 MG TABS tablet Take 10 mg by mouth daily.    [provider]  gabapentin (NEURONTIN) 300 MG capsule Take 1 capsule (300 mg total) by mouth 3 (three) times daily. 10/19/18   Vickki HearingHarrison, Stanley E, MD  ibuprofen (ADVIL,MOTRIN) 800 MG tablet Take 1 tablet (800 mg total) by mouth every 8 (eight) hours as needed. 07/14/18   Vickki HearingHarrison, Stanley E, MD  Insulin Glargine (TOUJEO SOLOSTAR) 300 UNIT/ML SOPN Inject 50 Units into the skin at bedtime. 12/19/15   Oval Linseyondiego, Richard, MD  labetalol (NORMODYNE) 200 MG tablet Take 1 tablet (200 mg total) by mouth 2 (two) times daily. 08/01/17   Oval Linseyondiego, Richard, MD  LEVEMIR FLEXTOUCH 100 UNIT/ML Pen  09/26/18   [provider]  metFORMIN (GLUCOPHAGE) 500 MG tablet Take 500 mg by mouth 2 (two) times daily with a meal.     [provider]  methocarbamol (ROBAXIN) 500 MG tablet Take 1 tablet (500 mg total) by mouth 3 (three) times daily. 09/13/18   Vickki HearingHarrison, Stanley E, MD  nitroGLYCERIN (NITROSTAT) 0.4 MG SL tablet Place 1 tablet (0.4 mg total) under the tongue every 5 (five) minutes as needed for chest pain. 06/19/15   Oval Linseyondiego, Richard, MD  olmesartan (BENICAR) 20 MG tablet Take 20 mg by mouth daily.    [provider]  oxyCODONE-acetaminophen (PERCOCET/ROXICET) 5-325 MG tablet Take 1 tablet by mouth every 8 (eight) hours as needed. 12/08/18   Vickki HearingHarrison, Stanley E, MD  potassium chloride SA (K-DUR,KLOR-CON) 20 MEQ tablet Take 1 tablet (20 mEq total) by mouth daily. 08/01/17   Oval Linseyondiego, Richard, MD  pravastatin (PRAVACHOL) 40 MG tablet Take 40 mg by mouth daily.    [provider]  tamsulosin (FLOMAX) 0.4 MG CAPS  capsule Take 1 capsule by mouth daily. 11/15/15   [provider]    ALLERGY: Allergies  Allergen Reactions  . Demerol [Meperidine] Itching    Is this an allergy ??? Patient stated he's allergic to "Dilaudid"  . Dilaudid [Hydromorphone] Itching    Social History   Tobacco Use  . Smoking status: Never Smoker  . Smokeless tobacco: Never Used  Substance Use Topics  . Alcohol use: Yes    Alcohol/week: 0.0 standard drinks    Comment: Occasionally     Family History  Problem Relation Age of Onset  . Diabetes Father   . Hypertension Father   . Heart failure Father   . Kidney disease Father   . Diabetes Sister   . Hypertension Sister   . Hypertension Mother      ROS   Review of Systems  Constitutional: Negative for chills, diaphoresis, fever, malaise/fatigue and weight loss.  HENT: Negative.   Eyes: Negative for blurred vision, double  vision and photophobia.  Respiratory: Negative.  Negative for cough, hemoptysis, sputum production, shortness of breath and wheezing.   Cardiovascular: Negative.  Negative for chest pain and palpitations.  Gastrointestinal: Negative for abdominal pain, constipation, diarrhea, nausea and vomiting.  Genitourinary: Negative.   Musculoskeletal: Positive for joint pain and neck pain. Negative for back pain, falls and myalgias.  Skin: Negative.   Neurological: Positive for tingling (LUE), tremors (LUE) and weakness (LUE). Negative for dizziness, sensory change, speech change, seizures, loss of consciousness and headaches.    Exam   Vitals:   05/08/19 1745 05/08/19 1800  BP: (!) 188/107   Pulse:  96  Resp:    Temp:    SpO2:  99%   General appearance: WDWN, NAD, resting comfortably, O2 sat 100% on room air, nontoxic in appearance although anxious about next steps Eyes: No scleral injection Cardiovascular: Regular rate and rhythm without murmurs, rubs, gallops. No edema or variciosities. Distal pulses normal. Pulmonary: Effort  normal, non-labored breathing Musculoskeletal:     Muscle tone upper extremities: Normal    Muscle tone lower extremities: Normal    Motor exam: Upper Extremities Deltoid Bicep Tricep Grip  Right 5/5 5/5 5/5 5/5  Left 4-/5 4+/5 4-/5 4+/5   Lower Extremity IP Quad PF DF EHL  Right 5/5 5/5 5/5 5/5 5/5  Left 5/5 5/5 5/5 5/5 5/5  Mild shaking of LUE when activating deltoid  Neurological Mental Status:    - Patient is awake, alert, oriented to person, place, month, year, and situation    - Patient is able to give a clear and coherent history.    - No signs of aphasia or neglect Cranial Nerves    - II: Visual Fields are full. PERRL    - III/IV/VI: EOMI without ptosis or diploplia.     - V: Facial sensation is grossly normal    - VII: Facial movement is symmetric.     - VIII: hearing is intact to voice    - X: Uvula elevates symmetrically    - XI: Shoulder shrug is symmetric.    - XII: tongue is midline without atrophy or fasciculations.  Sensory: Sensation grossly intact to LT Deep Tendon Reflexes    - 2+ and symmetric in the biceps and patellae.  Plantars   - Toes are downgoing bilaterally.  Cerebellar    - FNF and HKS are intact bilaterally  Skin Neck surgical incision is well approximated. There is mild swelling at surgical site that extends inferiorly. Mild tenderness to palpation although areas of swelling. No drainage, redness, warmth.   Results - Imaging/Labs   Results for orders placed or performed during the hospital encounter of 05/08/19 (from the past 48 hour(s))  CBC with Differential/Platelet     Status: None   Collection Time: 05/08/19  6:00 PM  Result Value Ref Range   WBC 6.1 4.0 - 10.5 K/uL   RBC 5.11 4.22 - 5.81 MIL/uL   Hemoglobin 15.3 13.0 - 17.0 g/dL   HCT 47.1 39.0 - 52.0 %   MCV 92.2 80.0 - 100.0 fL   MCH 29.9 26.0 - 34.0 pg   MCHC 32.5 30.0 - 36.0 g/dL   RDW 12.9 11.5 - 15.5 %   Platelets 221 150 - 400 K/uL   nRBC 0.0 0.0 - 0.2 %   Neutrophils  Relative % 60 %   Neutro Abs 3.7 1.7 - 7.7 K/uL   Lymphocytes Relative 29 %   Lymphs Abs 1.7 0.7 - 4.0 K/uL  Monocytes Relative 9 %   Monocytes Absolute 0.5 0.1 - 1.0 K/uL   Eosinophils Relative 1 %   Eosinophils Absolute 0.1 0.0 - 0.5 K/uL   Basophils Relative 1 %   Basophils Absolute 0.0 0.0 - 0.1 K/uL   Immature Granulocytes 0 %   Abs Immature Granulocytes 0.02 0.00 - 0.07 K/uL    Comment: Performed at [facility], [address] [place], [place] [id number]    No results found.  Impression/Plan   53 y.o. male s/p C4-5 arthroplasty on 05/04/2019 by Dr Franky Machoabbell who presents with new LUE pain and weakness as well as dysphagia. On exam he has new weakness in LUE proximally > distally, most pronounced at delt and tricep. Also noted is a small superficial wound hematoma, however he is nontoxic in appearance and sating at 100% on room air. He does not require any immediate wound exploration at present.   LUE pain and weakness: New since surgery - Obtain CT C spine to r/o epidural hematoma and assess hardware. Hold on MRI as this may have a lot of artifact from prestige cage.  Dysphagia - Decadron 10mg  now, then 4mg  q 6 hours - SLP consult. - NPO until SLP consult - Normal saline 100cc/hr  Diabetes - resume home meds - Will add resistant SSI due to steroids  HTN - Did not take home meds today - Resume home meds when cleared by swallow/SLP - prn hydralazine and labetalol   COVID screening for all admitted patients - pending    Cindra PresumeVincent Valentin Benney, PA-C [facility]

## 2019-05-08 NOTE — ED Provider Notes (Signed)
[facility] Provider Note   CSN: [id number] Arrival date & time: 05/08/19  1643    History   Chief Complaint Chief Complaint  Patient presents with  . Post-op Problem    HPI Geoffrey Jackson is a 53 y.o. male.     Geoffrey Jackson is a 53 y.o. male with history of hypertension, GERD, diabetes and recent C-4-5 arthroplasty with Dr. Franky Machoabbell on 05/04/2019, who presents today for evalaution of pain in the left shoulder and left upper extremity and LUE weakness since surgery, also reporting dysphagia.  Patient reports prior to surgery he was having issues with the upper extremity, the symptoms in the left arm are new since the surgery.  He reports 10/10 pain in the left upper extremity and neck.  He also reports that anytime he tries to eat or drink anything he feels it is difficult to swallow, he first noticed this primarily with solid foods, but it has not progressed to liquids. He reports " we I drink something I feel like I am drowning and the liquid is leaking into my neck". He is able to tolerate his secretions and is breathing comfortably. He reports some swelling around anterior neck incision, no redness or drainage, no fevers. No numbness or weakness in lower extremities. No headaches or vision changes. Pt spoke with on-call neurosurgery provider who directed him to ED.     Past Medical History:  Diagnosis Date  . Benign prostatic hypertrophy   . Essential hypertension   . GERD (gastroesophageal reflux disease)   . Low back pain   . Noncompliance with medications   . Obesity   . OSA (obstructive sleep apnea)    Non-compliant with CPAP  . Type 2 diabetes mellitus [facility](HCC)     Patient Active Problem List   Diagnosis Date Noted  . Dysphagia 05/08/2019  . Syncope 07/30/2017  . Nausea, vomiting, and diarrhea 12/17/2015  . Headache 12/17/2015  . Hypertensive emergency 12/17/2015  . CAP (community acquired pneumonia) 06/15/2015  .  Elevated troponin I level 06/15/2015  . Biceps tendonitis on right   . Tear of biceps muscle 10/25/2013  . Right shoulder pain 10/25/2013  . Chest pain 04/13/2013  . Diabetes mellitus type 2 in obese (HCC) 02/09/2007  . Hyperlipidemia 12/08/2006  . Obesity 12/08/2006  . Essential hypertension 12/08/2006  . GERD 12/08/2006  . BENIGN PROSTATIC HYPERTROPHY 12/08/2006    Past Surgical History:  Procedure Laterality Date  . BICEPT TENODESIS Right 11/10/2014   Procedure: BICEPS TENODESIS;  Surgeon: Vickki HearingStanley E Harrison, MD;  Location: [facility];  Service: Orthopedics;  Laterality: Right;  biceps tendon  . SHOULDER ARTHROSCOPY    . SHOULDER ARTHROSCOPY  05/21/2012   Procedure: ARTHROSCOPY SHOULDER;  Surgeon: Vickki HearingStanley E Harrison, MD;  Location: [facility]S;  Service: Orthopedics;  Laterality: Right;  . SPINE SURGERY     Dr Danielle DessElsner   . SUBACROMIAL DECOMPRESSION  05/21/2012   Procedure: SUBACROMIAL DECOMPRESSION;  Surgeon: Vickki HearingStanley E Harrison, MD;  Location: [facility] ORS;  Service: Orthopedics;  Laterality: Right;        Home Medications    Prior to Admission medications   Medication Sig Start Date End Date Taking? Authorizing Provider  albuterol (PROVENTIL HFA;VENTOLIN HFA) 108 (90 BASE) MCG/ACT inhaler Inhale 2 puffs into the lungs every 4 (four) hours as needed for wheezing or shortness of breath. 06/21/15  Yes Idol, Raynelle FanningJulie, PA-C  amLODipine (NORVASC) 10 MG tablet Take 10 mg by mouth daily. 11/15/15  [provider]  aspirin 81 MG chewable tablet Chew 1 tablet (81 mg total) by mouth daily. 08/02/17   Lucia Gaskins, MD  celecoxib (CELEBREX) 100 MG capsule Take 1 capsule (100 mg total) by mouth 2 (two) times daily. 09/02/18   Milton Ferguson, MD  cloNIDine (CATAPRES) 0.2 MG tablet Take 0.2 mg by mouth 2 (two) times daily. 04/28/18   [provider]  cyclobenzaprine (FLEXERIL) 10 MG tablet Take 10 mg by mouth 3 (three) times daily as needed for muscle spasms. 03/28/19   [provider]   dapagliflozin propanediol (FARXIGA) 10 MG TABS tablet Take 10 mg by mouth daily.    [provider]  gabapentin (NEURONTIN) 300 MG capsule Take 1 capsule (300 mg total) by mouth 3 (three) times daily. 10/19/18   Carole Civil, MD  ibuprofen (ADVIL,MOTRIN) 800 MG tablet Take 1 tablet (800 mg total) by mouth every 8 (eight) hours as needed. 07/14/18   Carole Civil, MD  Insulin Glargine (TOUJEO SOLOSTAR) 300 UNIT/ML SOPN Inject 50 Units into the skin at bedtime. 12/19/15   Lucia Gaskins, MD  labetalol (NORMODYNE) 200 MG tablet Take 1 tablet (200 mg total) by mouth 2 (two) times daily. 08/01/17   Lucia Gaskins, MD  LEVEMIR FLEXTOUCH 100 UNIT/ML Pen  09/26/18   [provider]  metFORMIN (GLUCOPHAGE) 500 MG tablet Take 500 mg by mouth 2 (two) times daily with a meal.     [provider]  methocarbamol (ROBAXIN) 500 MG tablet Take 1 tablet (500 mg total) by mouth 3 (three) times daily. 09/13/18   Carole Civil, MD  nitroGLYCERIN (NITROSTAT) 0.4 MG SL tablet Place 1 tablet (0.4 mg total) under the tongue every 5 (five) minutes as needed for chest pain. 06/19/15   Lucia Gaskins, MD  olmesartan (BENICAR) 20 MG tablet Take 20 mg by mouth daily.    [provider]  oxyCODONE-acetaminophen (PERCOCET/ROXICET) 5-325 MG tablet Take 1 tablet by mouth every 8 (eight) hours as needed. 12/08/18   Carole Civil, MD  potassium chloride SA (K-DUR,KLOR-CON) 20 MEQ tablet Take 1 tablet (20 mEq total) by mouth daily. 08/01/17   Lucia Gaskins, MD  pravastatin (PRAVACHOL) 40 MG tablet Take 40 mg by mouth daily.    [provider]  tamsulosin (FLOMAX) 0.4 MG CAPS capsule Take 1 capsule by mouth daily. 11/15/15   [provider]    Family History Family History  Problem Relation Age of Onset  . Diabetes Father   . Hypertension Father   . Heart failure Father   . Kidney disease Father   . Diabetes Sister   . Hypertension Sister   .  Hypertension Mother     Social History Social History   Tobacco Use  . Smoking status: Never Smoker  . Smokeless tobacco: Never Used  Substance Use Topics  . Alcohol use: Yes    Alcohol/week: 0.0 standard drinks    Comment: Occasionally  . Drug use: No     Allergies   Demerol [meperidine] and Dilaudid [hydromorphone]   Review of Systems Review of Systems  Constitutional: Negative for chills and fever.  HENT: Positive for trouble swallowing.   Eyes: Negative for visual disturbance.  Respiratory: Negative for cough and shortness of breath.   Cardiovascular: Negative for chest pain.  Gastrointestinal: Negative for abdominal pain, nausea and vomiting.  Musculoskeletal: Positive for arthralgias and neck pain.  Skin: Negative for color change and rash.  Neurological: Positive for weakness. Negative for headaches.  All other systems reviewed and are negative.    Physical Exam Updated Vital Signs BP (!) 181/113 (BP Location: Left Arm)   Pulse (!) 109   Temp 97.8 F (36.6 C) (Oral)   Resp 18   SpO2 98%   Physical Exam Vitals signs and nursing note reviewed.  Constitutional:      General: He is not in acute distress.    Appearance: Normal appearance. He is well-developed and normal weight. He is not diaphoretic.  HENT:     Head: Normocephalic and atraumatic.     Mouth/Throat:     Mouth: Mucous membranes are moist.     Pharynx: Oropharynx is clear.     Comments: Posterior oropharynx clear, no trismus, tolerating secretions without difficulty, normal phonation. Eyes:     General:        Right eye: No discharge.        Left eye: No discharge.     Pupils: Pupils are equal, round, and reactive to light.  Neck:     Musculoskeletal: Neck supple.     Comments: Patient is not stridorous, breathing comfortably.  Anterior neck incision noted, appears to be healing well without erythema or drainage, small amount of swelling surrounding incision palpable.  No large palpable  hematoma. Cardiovascular:     Rate and Rhythm: Normal rate and regular rhythm.     Heart sounds: Normal heart sounds.  Pulmonary:     Effort: Pulmonary effort is normal. No respiratory distress.     Breath sounds: Normal breath sounds. No wheezing or rales.     Comments: Respirations equal and unlabored, patient able to speak in full sentences, lungs clear to auscultation bilaterally Abdominal:     General: Bowel sounds are normal. There is no distension.     Palpations: Abdomen is soft. There is no mass.     Tenderness: There is no abdominal tenderness. There is no guarding.  Musculoskeletal:        General: No deformity.  Skin:    General: Skin is warm and dry.     Capillary Refill: Capillary refill takes less than 2 seconds.  Neurological:     Mental Status: He is alert and oriented to person, place, and time.     Coordination: Coordination normal.     Comments: Speech is clear, able to follow commands CN III-XII intact Left upper extremity with 4+/5 strength, all other extremities 5/5 strength. Sensation normal to light and sharp touch Moves extremities without ataxia, coordination intact   Psychiatric:        Mood and Affect: Mood normal.        Behavior: Behavior normal.      ED Treatments / Results  Labs (all labs ordered are listed, but only abnormal results are displayed) Labs Reviewed  BASIC METABOLIC PANEL - Abnormal; Notable for the following components:      Result Value   Potassium 3.3 (*)    Glucose, Bld 234 (*)    All other components within normal limits  NOVEL CORONAVIRUS, NAA (HOSPITAL ORDER, SEND-OUT TO REF LAB)  CBC WITH DIFFERENTIAL/PLATELET  HIV ANTIBODY (ROUTINE TESTING W REFLEX)    EKG None  Radiology No results found.  Procedures Procedures (including critical care time)  Medications Ordered in ED Medications  dexamethasone (DECADRON) injection 10 mg (has no administration in time range)  dexamethasone (DECADRON) injection 4 mg (has  no administration in time range)  amLODipine (NORVASC) tablet 10 mg (has no administration in time range)  cloNIDine (  CATAPRES) tablet 0.2 mg (has no administration in time range)  labetalol (NORMODYNE) tablet 200 mg (has no administration in time range)  nitroGLYCERIN (NITROSTAT) SL tablet 0.4 mg (has no administration in time range)  irbesartan (AVAPRO) tablet 150 mg (has no administration in time range)  pravastatin (PRAVACHOL) tablet 40 mg (has no administration in time range)  metFORMIN (GLUCOPHAGE) tablet 500 mg (has no administration in time range)  tamsulosin (FLOMAX) capsule 0.4 mg (has no administration in time range)  gabapentin (NEURONTIN) capsule 300 mg (has no administration in time range)  potassium chloride SA (K-DUR) CR tablet 20 mEq (has no administration in time range)  albuterol (VENTOLIN HFA) 108 (90 Base) MCG/ACT inhaler 2 puff (has no administration in time range)  0.9 %  sodium chloride infusion (has no administration in time range)  sodium chloride flush (NS) 0.9 % injection 3 mL (has no administration in time range)  sodium chloride flush (NS) 0.9 % injection 3 mL (has no administration in time range)  0.9 %  sodium chloride infusion (has no administration in time range)  HYDROcodone-acetaminophen (NORCO/VICODIN) 5-325 MG per tablet 1-2 tablet (has no administration in time range)  acetaminophen (TYLENOL) tablet 650 mg (has no administration in time range)    Or  acetaminophen (TYLENOL) suppository 650 mg (has no administration in time range)  ondansetron (ZOFRAN) tablet 4 mg (has no administration in time range)    Or  ondansetron (ZOFRAN) injection 4 mg (has no administration in time range)  sodium phosphate (FLEET) 7-19 GM/118ML enema 1 enema (has no administration in time range)  bisacodyl (DULCOLAX) EC tablet 5 mg (has no administration in time range)  senna-docusate (Senokot-S) tablet 1 tablet (has no administration in time range)  docusate sodium (COLACE)  capsule 100 mg (has no administration in time range)  zolpidem (AMBIEN) tablet 5 mg (has no administration in time range)  hydrALAZINE (APRESOLINE) injection 5-20 mg (has no administration in time range)  labetalol (NORMODYNE) injection 10 mg (has no administration in time range)  insulin aspart (novoLOG) injection 0-20 Units (has no administration in time range)  insulin aspart (novoLOG) injection 0-5 Units (has no administration in time range)  HYDROmorphone (DILAUDID) injection 1 mg (has no administration in time range)     Initial Impression / Assessment and Plan / ED Course  I have reviewed the triage vital signs and the nursing notes.  Pertinent labs & imaging results that were available during my care of the patient were reviewed by me and considered in my medical decision making (see chart for details).  Patient with recent cervical arthroplasty presents with neck and left upper extremity pain, left upper extremity weakness and difficulty swallowing.  On arrival patient is hypertensive, did not take blood pressure medications today, but all other vitals normal.  He is not stridorous, airway is intact, no respiratory distress.  Tolerating secretions.  No large palpable incisional hematoma present, anterior neck incision is clean dry and intact.  Patient does have some new left upper extremity weakness, particularly in proximal muscles.  Neurosurgery PA at bedside to evaluate patient.  Neurosurgery request basic labs and CT of the C-spine.,  IV steroids given, they will plan to admit patient for continued IV steroids and monitoring, does not require emergent surgical exploration and airway remains stable.  PA Dorien ChihuahuaVincent Costello will admit patient to neurosurgery service with Dr. Venetia MaxonStern.   Final Clinical Impressions(s) / ED Diagnoses   Final diagnoses:  Postoperative pain  Weakness of left upper extremity  H/O cervical spine surgery    ED Discharge Orders    None       Dartha LodgeFord,  Omarie Parcell N, [place] JerseyPA-C 05/10/19 1653    Charlynne PanderYao, David Hsienta, MD 05/11/19 [phone]

## 2019-05-08 NOTE — ED Notes (Signed)
ED TO INPATIENT HANDOFF REPORT  ED Nurse Name and Phone #: Harriette Bouillon [phone]  S Name/Age/Gender Geoffrey Jackson 53 y.o. male Room/Bed: 015C/015C  Code Status   Code Status: Full Code  Home/SNF/Other Home Patient oriented to: self, place, time and situation Is this baseline? Yes   Triage Complete: Triage complete  Chief Complaint neck pain post surg  Triage Note Pt reports having cervical surgery on wed. Now has increase in weakness, left side pain and having n/v. Dr Christella Noa has been notified and will eval pt in ED.    Allergies Allergies  Allergen Reactions  . Demerol [Meperidine] Itching    Is this an allergy ??? Patient stated he's allergic to "Dilaudid"  . Dilaudid [Hydromorphone] Itching    Level of Care/Admitting Diagnosis ED Disposition    ED Disposition Condition [facility] Area: [place] [[id number]]  Level of Care: Progressive [102]  Covid Evaluation: Screening Protocol (No Symptoms)  Diagnosis: Dysphagia [423536]  Admitting Physician: Erline Levine [[id number]]  Attending Physician: Erline Levine [[id number]]  Bed request comments: 4NP  PT Class (Do Not Modify): Observation [104]  PT Acc Code (Do Not Modify): Observation [10022]       B Medical/Surgery History Past Medical History:  Diagnosis Date  . Benign prostatic hypertrophy   . Essential hypertension   . GERD (gastroesophageal reflux disease)   . Low back pain   . Noncompliance with medications   . Obesity   . OSA (obstructive sleep apnea)    Non-compliant with CPAP  . Type 2 diabetes mellitus ([place])    Past Surgical History:  Procedure Laterality Date  . BICEPT TENODESIS Right 11/10/2014   Procedure: BICEPS TENODESIS;  Surgeon: Carole Civil, MD;  Location: [facility];  Service: Orthopedics;  Laterality: Right;  biceps tendon  . SHOULDER ARTHROSCOPY    . SHOULDER ARTHROSCOPY  05/21/2012   Procedure: ARTHROSCOPY SHOULDER;  Surgeon: Carole Civil, MD;   Location: [facility];  Service: Orthopedics;  Laterality: Right;  . SPINE SURGERY     Dr Ellene Route   . SUBACROMIAL DECOMPRESSION  05/21/2012   Procedure: SUBACROMIAL DECOMPRESSION;  Surgeon: Carole Civil, MD;  Location: [facility];  Service: Orthopedics;  Laterality: Right;     A IV Location/Drains/Wounds Patient Lines/Drains/Airways Status   Active Line/Drains/Airways    Name:   Placement date:   Placement time:   Site:   Days:   Peripheral IV 05/08/19 Right;Upper Forearm   05/08/19    1800    Forearm   less than 1          Intake/Output Last 24 hours No intake or output data in the 24 hours ending 05/08/19 1910  Labs/Imaging Results for orders placed or performed during the hospital encounter of 05/08/19 (from the past 48 hour(s))  CBC with Differential/Platelet     Status: None   Collection Time: 05/08/19  6:00 PM  Result Value Ref Range   WBC 6.1 4.0 - 10.5 K/uL   RBC 5.11 4.22 - 5.81 MIL/uL   Hemoglobin 15.3 13.0 - 17.0 g/dL   HCT 47.1 39.0 - 52.0 %   MCV 92.2 80.0 - 100.0 fL   MCH 29.9 26.0 - 34.0 pg   MCHC 32.5 30.0 - 36.0 g/dL   RDW 12.9 11.5 - 15.5 %   Platelets 221 150 - 400 K/uL   nRBC 0.0 0.0 - 0.2 %   Neutrophils Relative % 60 %   Neutro Abs 3.7 1.7 -  7.7 K/uL   Lymphocytes Relative 29 %   Lymphs Abs 1.7 0.7 - 4.0 K/uL   Monocytes Relative 9 %   Monocytes Absolute 0.5 0.1 - 1.0 K/uL   Eosinophils Relative 1 %   Eosinophils Absolute 0.1 0.0 - 0.5 K/uL   Basophils Relative 1 %   Basophils Absolute 0.0 0.0 - 0.1 K/uL   Immature Granulocytes 0 %   Abs Immature Granulocytes 0.02 0.00 - 0.07 K/uL    Comment: Performed at [facility], [address] [place], [place] [address]  Basic metabolic panel     Status: Abnormal   Collection Time: 05/08/19  6:00 PM  Result Value Ref Range   Sodium 139 135 - 145 mmol/L   Potassium 3.3 (L) 3.5 - 5.1 mmol/L   Chloride 105 98 - 111 mmol/L   CO2 25 22 - 32 mmol/L   Glucose, Bld 234 (H) 70 - 99 mg/dL   BUN 12 6 - 20  mg/dL   Creatinine, Ser 0.271.02 0.61 - 1.24 mg/dL   Calcium 9.9 8.9 - 25.310.3 mg/dL   GFR calc non Af Amer >60 >60 mL/min   GFR calc Af Amer >60 >60 mL/min   Anion gap 9 5 - 15    Comment: Performed at [facility], [address] [place], [place] [address]   Ct Cervical Spine Wo Contrast  Result Date: 05/08/2019 CLINICAL DATA:  Patient had cervical spine surgery five days ago, now with increasing weakness and left-sided pain. Some nausea and vomiting. EXAM: CT CERVICAL SPINE WITHOUT CONTRAST TECHNIQUE: Multidetector CT imaging of the cervical spine was performed without intravenous contrast. Multiplanar CT image reconstructions were also generated. COMPARISON:  Cervical spine CT dated 07/30/2017. FINDINGS: Alignment: Straightened cervical lordosis.  No spondylolisthesis. Skull base and vertebrae: No acute fracture. There has been an interbody fusion at the C4-C5 level. What appears to be a prosthetic disc is centered on the left anterior aspect of the C4-C5 vertebra. There is a small amount of air within the posterior aspect of the C4-C5 disc interspace. Soft tissue air lies anterior to the C4-C5 disc level with surrounding fluid attenuation, measuring approximately 1.8 cm from anterior to posterior by 3.2 cm right to left. The superior to inferior extent is not well-defined. The fluid collection mildly displaces the laryngeal structures anteriorly. Residual uncovertebral spurring and mild facet spurring causes moderate bilateral neural foraminal narrowing at C4-C5. There is no definitive mass or fluid collection within the spinal canal, although the spinal canal structures are not well-defined. They are are no osteoblastic or osteolytic lesions. Soft tissues and spinal canal: No spinal canal mass or definitive fluid collection. No other paraspinal abnormality. Disc levels: Mild loss of disc height at C2-C3. Small anterior endplate osteophytes are noted from C2 through C6, bridging at C2-C3. No  convincing disc herniation. Upper chest: No mass or adenopathy. No acute finding. Clear lung apices. Other: None. IMPRESSION: 1. Status post anterior interbody fusion at C4-C5. There is a prevertebral collection containing air centered at this level, measuring 1.8 cm from anterior to posterior, mildly deviating the laryngeal structures anteriorly. There is no convincing soft tissue mass or fluid collection within the spinal canal, although the spinal canal contents are poorly defined on CT. There is moderate bilateral neural foraminal narrowing at the fused level, which is increased when compared to the previous cervical spine CT. Electronically Signed   By: Amie Portlandavid  Ormond M.D.   On: 05/08/2019 18:56    Pending Labs  Unresulted Labs (From admission, onward)    Start     Ordered   05/08/19 1837  Novel Coronavirus,NAA,(SEND-OUT TO REF LAB - TAT 24-48 hrs); Hosp Order  (Asymptomatic Patients Labs)  Once,   STAT    Question:  Rule Out  Answer:  Yes   05/08/19 1836   05/08/19 1824  HIV antibody (Routine Testing)  Once,   STAT     05/08/19 1827          Vitals/Pain Today's Vitals   05/08/19 1715 05/08/19 1730 05/08/19 1745 05/08/19 1800  BP: (!) 179/111 (!) 189/117 (!) 188/107   Pulse:    96  Resp:      Temp:      TempSrc:      SpO2:    99%  PainSc:        Isolation Precautions No active isolations  Medications Medications  dexamethasone (DECADRON) injection 10 mg (has no administration in time range)  dexamethasone (DECADRON) injection 4 mg (has no administration in time range)  amLODipine (NORVASC) tablet 10 mg (has no administration in time range)  cloNIDine (CATAPRES) tablet 0.2 mg (has no administration in time range)  labetalol (NORMODYNE) tablet 200 mg (has no administration in time range)  nitroGLYCERIN (NITROSTAT) SL tablet 0.4 mg (has no administration in time range)  irbesartan (AVAPRO) tablet 150 mg (has no administration in time range)  pravastatin (PRAVACHOL) tablet 40  mg (has no administration in time range)  metFORMIN (GLUCOPHAGE) tablet 500 mg (has no administration in time range)  tamsulosin (FLOMAX) capsule 0.4 mg (has no administration in time range)  gabapentin (NEURONTIN) capsule 300 mg (has no administration in time range)  potassium chloride SA (K-DUR) CR tablet 20 mEq (has no administration in time range)  albuterol (VENTOLIN HFA) 108 (90 Base) MCG/ACT inhaler 2 puff (has no administration in time range)  0.9 %  sodium chloride infusion (has no administration in time range)  sodium chloride flush (NS) 0.9 % injection 3 mL (has no administration in time range)  sodium chloride flush (NS) 0.9 % injection 3 mL (has no administration in time range)  0.9 %  sodium chloride infusion (has no administration in time range)  HYDROcodone-acetaminophen (NORCO/VICODIN) 5-325 MG per tablet 1-2 tablet (has no administration in time range)  acetaminophen (TYLENOL) tablet 650 mg (has no administration in time range)    Or  acetaminophen (TYLENOL) suppository 650 mg (has no administration in time range)  ondansetron (ZOFRAN) tablet 4 mg (has no administration in time range)    Or  ondansetron (ZOFRAN) injection 4 mg (has no administration in time range)  sodium phosphate (FLEET) 7-19 GM/118ML enema 1 enema (has no administration in time range)  bisacodyl (DULCOLAX) EC tablet 5 mg (has no administration in time range)  senna-docusate (Senokot-S) tablet 1 tablet (has no administration in time range)  docusate sodium (COLACE) capsule 100 mg (has no administration in time range)  zolpidem (AMBIEN) tablet 5 mg (has no administration in time range)  hydrALAZINE (APRESOLINE) injection 5-20 mg (has no administration in time range)  labetalol (NORMODYNE) injection 10 mg (has no administration in time range)  insulin aspart (novoLOG) injection 0-20 Units (has no administration in time range)  insulin aspart (novoLOG) injection 0-5 Units (has no administration in time  range)  HYDROmorphone (DILAUDID) injection 1 mg (has no administration in time range)    Mobility walks Low fall risk   Focused Assessments Cardiac Assessment Handoff:    Lab Results  Component Value Date  TROPONINI 0.03 (HH) 07/31/2017   Lab Results  Component Value Date   DDIMER 0.34 06/15/2015   Does the Patient currently have chest pain? No     R Recommendations: See Admitting Provider Note  Report given to:   Additional Notes:

## 2019-05-08 NOTE — ED Notes (Signed)
Attempted to call report to 4N. Nurse unavailable will call back

## 2019-05-08 NOTE — ED Triage Notes (Addendum)
Pt reports having cervical surgery on wed. Now has increase in weakness, left side pain and having n/v. Dr Christella Noa has been notified and will eval pt in ED.

## 2019-05-09 DIAGNOSIS — G8918 Other acute postprocedural pain: Secondary | ICD-10-CM | POA: Diagnosis present

## 2019-05-09 DIAGNOSIS — I10 Essential (primary) hypertension: Secondary | ICD-10-CM | POA: Diagnosis present

## 2019-05-09 DIAGNOSIS — K219 Gastro-esophageal reflux disease without esophagitis: Secondary | ICD-10-CM | POA: Diagnosis present

## 2019-05-09 DIAGNOSIS — R1310 Dysphagia, unspecified: Secondary | ICD-10-CM | POA: Diagnosis present

## 2019-05-09 DIAGNOSIS — N400 Enlarged prostate without lower urinary tract symptoms: Secondary | ICD-10-CM | POA: Diagnosis present

## 2019-05-09 DIAGNOSIS — T8189XA Other complications of procedures, not elsewhere classified, initial encounter: Secondary | ICD-10-CM | POA: Diagnosis present

## 2019-05-09 DIAGNOSIS — G4733 Obstructive sleep apnea (adult) (pediatric): Secondary | ICD-10-CM | POA: Diagnosis present

## 2019-05-09 DIAGNOSIS — E119 Type 2 diabetes mellitus without complications: Secondary | ICD-10-CM | POA: Diagnosis present

## 2019-05-09 DIAGNOSIS — Z8249 Family history of ischemic heart disease and other diseases of the circulatory system: Secondary | ICD-10-CM | POA: Diagnosis not present

## 2019-05-09 DIAGNOSIS — Z833 Family history of diabetes mellitus: Secondary | ICD-10-CM | POA: Diagnosis not present

## 2019-05-09 DIAGNOSIS — Z794 Long term (current) use of insulin: Secondary | ICD-10-CM | POA: Diagnosis not present

## 2019-05-09 DIAGNOSIS — Z885 Allergy status to narcotic agent status: Secondary | ICD-10-CM | POA: Diagnosis not present

## 2019-05-09 DIAGNOSIS — Z20828 Contact with and (suspected) exposure to other viral communicable diseases: Secondary | ICD-10-CM | POA: Diagnosis present

## 2019-05-09 DIAGNOSIS — R531 Weakness: Secondary | ICD-10-CM | POA: Diagnosis present

## 2019-05-09 DIAGNOSIS — Z841 Family history of disorders of kidney and ureter: Secondary | ICD-10-CM | POA: Diagnosis not present

## 2019-05-09 DIAGNOSIS — E785 Hyperlipidemia, unspecified: Secondary | ICD-10-CM | POA: Diagnosis present

## 2019-05-09 DIAGNOSIS — Z7982 Long term (current) use of aspirin: Secondary | ICD-10-CM | POA: Diagnosis not present

## 2019-05-09 LAB — GLUCOSE, CAPILLARY
Glucose-Capillary: 252 mg/dL — ABNORMAL HIGH (ref 70–99)
Glucose-Capillary: 267 mg/dL — ABNORMAL HIGH (ref 70–99)
Glucose-Capillary: 297 mg/dL — ABNORMAL HIGH (ref 70–99)
Glucose-Capillary: 298 mg/dL — ABNORMAL HIGH (ref 70–99)
Glucose-Capillary: 334 mg/dL — ABNORMAL HIGH (ref 70–99)
Glucose-Capillary: 338 mg/dL — ABNORMAL HIGH (ref 70–99)
Glucose-Capillary: 375 mg/dL — ABNORMAL HIGH (ref 70–99)
Glucose-Capillary: 392 mg/dL — ABNORMAL HIGH (ref 70–99)
Glucose-Capillary: 439 mg/dL — ABNORMAL HIGH (ref 70–99)

## 2019-05-09 LAB — HIV ANTIBODY (ROUTINE TESTING W REFLEX): HIV Screen 4th Generation wRfx: NONREACTIVE

## 2019-05-09 LAB — MRSA PCR SCREENING: MRSA by PCR: NEGATIVE

## 2019-05-09 NOTE — Progress Notes (Signed)
Subjective: Patient reports pain is improved today.  Objective: Vital signs in last 24 hours: Temp:  [97.8 F (36.6 C)-98.3 F (36.8 C)] 98.3 F (36.8 C) (06/15 0719) Pulse Rate:  [84-109] 90 (06/15 0719) Resp:  [13-18] 13 (06/14 2136) BP: (153-189)/(85-117) 163/85 (06/15 0719) SpO2:  [96 %-99 %] 97 % (06/15 0719)  Intake/Output from previous day: 06/14 0701 - 06/15 0700 In: 276.9 [I.V.:276.9] Out: 1200 [Urine:1200] Intake/Output this shift: No intake/output data recorded.  Physical Exam: Left deltoid weakness and parascapular pain on left persists, but remainder of exam reveals full strength.  Patient is up and ambulating.  Incision is CDI.  Lab Results: Recent Labs    05/08/19 1800  WBC 6.1  HGB 15.3  HCT 47.1  PLT 221   BMET Recent Labs    05/08/19 1800  NA 139  K 3.3*  CL 105  CO2 25  GLUCOSE 234*  BUN 12  CREATININE 1.02  CALCIUM 9.9    Studies/Results: Ct Cervical Spine Wo Contrast  Result Date: 05/08/2019 CLINICAL DATA:  Patient had cervical spine surgery five days ago, now with increasing weakness and left-sided pain. Some nausea and vomiting. EXAM: CT CERVICAL SPINE WITHOUT CONTRAST TECHNIQUE: Multidetector CT imaging of the cervical spine was performed without intravenous contrast. Multiplanar CT image reconstructions were also generated. COMPARISON:  Cervical spine CT dated 07/30/2017. FINDINGS: Alignment: Straightened cervical lordosis.  No spondylolisthesis. Skull base and vertebrae: No acute fracture. There has been an interbody fusion at the C4-C5 level. What appears to be a prosthetic disc is centered on the left anterior aspect of the C4-C5 vertebra. There is a small amount of air within the posterior aspect of the C4-C5 disc interspace. Soft tissue air lies anterior to the C4-C5 disc level with surrounding fluid attenuation, measuring approximately 1.8 cm from anterior to posterior by 3.2 cm right to left. The superior to inferior extent is not  well-defined. The fluid collection mildly displaces the laryngeal structures anteriorly. Residual uncovertebral spurring and mild facet spurring causes moderate bilateral neural foraminal narrowing at C4-C5. There is no definitive mass or fluid collection within the spinal canal, although the spinal canal structures are not well-defined. They are are no osteoblastic or osteolytic lesions. Soft tissues and spinal canal: No spinal canal mass or definitive fluid collection. No other paraspinal abnormality. Disc levels: Mild loss of disc height at C2-C3. Small anterior endplate osteophytes are noted from C2 through C6, bridging at C2-C3. No convincing disc herniation. Upper chest: No mass or adenopathy. No acute finding. Clear lung apices. Other: None. IMPRESSION: 1. Status post anterior interbody fusion at C4-C5. There is a prevertebral collection containing air centered at this level, measuring 1.8 cm from anterior to posterior, mildly deviating the laryngeal structures anteriorly. There is no convincing soft tissue mass or fluid collection within the spinal canal, although the spinal canal contents are poorly defined on CT. There is moderate bilateral neural foraminal narrowing at the fused level, which is increased when compared to the previous cervical spine CT. Electronically Signed   By: Lajean Manes M.D.   On: 05/08/2019 18:56    Assessment/Plan: Patient is better with decadron.  Will observe in hospital today and if continuing to improve, likely discharge home tomorrow.    LOS: 0 days    Peggyann Shoals, MD 05/09/2019, 8:30 AM

## 2019-05-09 NOTE — Progress Notes (Signed)
Inpatient Diabetes Program Recommendations  AACE/ADA: New Consensus Statement on Inpatient Glycemic Control (2015)  Target Ranges:  Prepandial:   less than 140 mg/dL      Peak postprandial:   less than 180 mg/dL (1-2 hours)      Critically ill patients:  140 - 180 mg/dL   Results for MARTEL, GALVAN (MRN [id number]) as of 05/09/2019 12:07  Ref. Range 05/08/2019 20:37 05/09/2019 00:23 05/09/2019 02:07 05/09/2019 04:19 05/09/2019 07:16  Glucose-Capillary Latest Ref Range: 70 - 99 mg/dL 208 (H) 439 (H) 392 (H) 298 (H) 267 (H)   Review of Glycemic Control  Diabetes history:  DM 2 Outpatient Diabetes medications: Farxiga 10 mg Daily, Metformin 500 mg bid, Levemir 50 units Current orders for Inpatient glycemic control:  Novolog 0-20 units tid Novolog 0-5 units qhs Metformin 500 mg bid  Patient received Decadron 4 mg 4x/day  Inpatient Diabetes Program Recommendations:    Patient on basal insulin at home, Glucose trends in the 200 range, Consider adding Levemir 25 units (half of home dose).  Thanks,  Tama Headings RN, MSN, BC-ADM Inpatient Diabetes Coordinator Team Pager [phone] (8a-5p)

## 2019-05-09 NOTE — Evaluation (Signed)
Clinical/Bedside Swallow Evaluation Patient Details  Name: Geoffrey Jackson MRN: [id number] Date of Birth: [birth year]  Today's Date: 05/09/2019 Time: SLP Start Time (ACUTE ONLY): 1209 SLP Stop Time (ACUTE ONLY): 1230 SLP Time Calculation (min) (ACUTE ONLY): 21 min  Past Medical History:  Past Medical History:  Diagnosis Date  . Benign prostatic hypertrophy   . Essential hypertension   . GERD (gastroesophageal reflux disease)   . Low back pain   . Noncompliance with medications   . Obesity   . OSA (obstructive sleep apnea)    Non-compliant with CPAP  . Type 2 diabetes mellitus ([place])    Past Surgical History:  Past Surgical History:  Procedure Laterality Date  . BICEPT TENODESIS Right 11/10/2014   Procedure: BICEPS TENODESIS;  Surgeon: Carole Civil, MD;  Location: [facility];  Service: Orthopedics;  Laterality: Right;  biceps tendon  . SHOULDER ARTHROSCOPY    . SHOULDER ARTHROSCOPY  05/21/2012   Procedure: ARTHROSCOPY SHOULDER;  Surgeon: Carole Civil, MD;  Location: [facility];  Service: Orthopedics;  Laterality: Right;  . SPINE SURGERY     Dr Ellene Route   . SUBACROMIAL DECOMPRESSION  05/21/2012   Procedure: SUBACROMIAL DECOMPRESSION;  Surgeon: Carole Civil, MD;  Location: [facility];  Service: Orthopedics;  Laterality: Right;   HPI:  53 y.o. male s/p C4-5 arthroplasty on 05/04/2019 by Dr Christella Noa who presents with new LUE pain and weakness as well as dysphagia. On exam he has new weakness in LUE proximally > distally, most pronounced at delt and tricep. Also noted is a small superficial wound hematoma. CT cervical spine 6/14: Status post anterior interbody fusion at C4-C5. There is a prevertebral collection containing air centered at this level, measuring 1.8 cm from anterior to posterior, mildly deviating the laryngeal structures anteriorly.   Assessment / Plan / Recommendation Clinical Impression  Pt with improved swallowing since admission.  Reviewed cervical CT with pt and  discussed the impact of post-vertebral edema on the mechanics of the swallow.  Pt reports better function today.  Clinical swallow assessment reveals no concerns for airway protection, no s/s of pharyngeal retention of POs.  Continue regular diet, thin liquids.  No SLP f/u is needed. Our service will sign off.  SLP Visit Diagnosis: Dysphagia, unspecified (R13.10)    Aspiration Risk  No limitations    Diet Recommendation   regular solids, thin liquids  Medication Administration: Whole meds with liquid    Other  Recommendations Oral Care Recommendations: Oral care BID   Follow up Recommendations   none       Swallow Study   General Date of Onset: 05/08/19 HPI: 53 y.o. male s/p C4-5 arthroplasty on 05/04/2019 by Dr Christella Noa who presents with new LUE pain and weakness as well as dysphagia. On exam he has new weakness in LUE proximally > distally, most pronounced at delt and tricep. Also noted is a small superficial wound hematoma. CT cervical spine 6/14: Status post anterior interbody fusion at C4-C5. There is a prevertebral collection containing air centered at this level, measuring 1.8 cm from anterior to posterior, mildly deviating the laryngeal structures anteriorly. Type of Study: Bedside Swallow Evaluation Previous Swallow Assessment: no Diet Prior to this Study: Regular;Thin liquids Temperature Spikes Noted: No Respiratory Status: Room air History of Recent Intubation: No Behavior/Cognition: Alert;Cooperative;Pleasant mood Oral Cavity Assessment: Within Functional Limits Oral Care Completed by SLP: No Oral Cavity - Dentition: Adequate natural dentition Vision: Functional for self-feeding Self-Feeding Abilities: Able to feed self Patient Positioning: Upright  in bed Baseline Vocal Quality: Normal Volitional Cough: Strong Volitional Swallow: Able to elicit    Oral/Motor/Sensory Function Overall Oral Motor/Sensory Function: Within functional limits   Ice Chips Ice chips: Within  functional limits   Thin Liquid Thin Liquid: Within functional limits    Nectar Thick Nectar Thick Liquid: Not tested   Honey Thick Honey Thick Liquid: Not tested   Puree Puree: Within functional limits   Solid     Solid: Within functional limits      Blenda MountsCouture, Treson Laura Laurice 05/09/2019,12:34 PM   Marchelle FolksAmanda L. Samson Fredericouture, MA CCC/SLP Acute Rehabilitation Services Office number [phone] Pager [phone]

## 2019-05-10 LAB — NOVEL CORONAVIRUS, NAA (HOSP ORDER, SEND-OUT TO REF LAB; TAT 18-24 HRS): SARS-CoV-2, NAA: NOT DETECTED

## 2019-05-10 LAB — GLUCOSE, CAPILLARY
Glucose-Capillary: 231 mg/dL — ABNORMAL HIGH (ref 70–99)
Glucose-Capillary: 256 mg/dL — ABNORMAL HIGH (ref 70–99)
Glucose-Capillary: 298 mg/dL — ABNORMAL HIGH (ref 70–99)

## 2019-05-10 MED ORDER — HYDROCODONE-ACETAMINOPHEN 5-325 MG PO TABS: 1.0000 | ORAL_TABLET | ORAL | 0 refills | Status: AC | PRN

## 2019-05-10 NOTE — Progress Notes (Signed)
Pt given d/c education and all questions answered. No printed prescriptions to give or equipment to deliver. IV removed and pt taken to car with all belongings.

## 2019-05-10 NOTE — Progress Notes (Addendum)
Subjective: Patient reports "I'm doing better"  Objective: Vital signs in last 24 hours: Temp:  [97.6 F (36.4 C)-98.6 F (37 C)] 97.6 F (36.4 C) (06/16 0339) Pulse Rate:  [87-100] 87 (06/16 0741) Resp:  [19-22] 20 (06/16 0741) BP: (139-177)/(69-100) 143/86 (06/16 0741) SpO2:  [91 %-100 %] 97 % (06/16 0741)  Intake/Output from previous day: 06/15 0701 - 06/16 0700 In: 835 [P.O.:680; I.V.:155] Out: 1510 [Urine:1510] Intake/Output this shift: No intake/output data recorded.  Alert, conversant. MAEW. Left deltoid weaker than right, but improved. Triceps and hand intrinsics good bilat. Pt reports only posterior cervical soreness, noting resolved left arm pain. Incision healing nicely without erythema swelling or drainage,   Lab Results: Recent Labs    05/08/19 1800  WBC 6.1  HGB 15.3  HCT 47.1  PLT 221   BMET Recent Labs    05/08/19 1800  NA 139  K 3.3*  CL 105  CO2 25  GLUCOSE 234*  BUN 12  CREATININE 1.02  CALCIUM 9.9    Studies/Results: Ct Cervical Spine Wo Contrast  Result Date: 05/08/2019 CLINICAL DATA:  Patient had cervical spine surgery five days ago, now with increasing weakness and left-sided pain. Some nausea and vomiting. EXAM: CT CERVICAL SPINE WITHOUT CONTRAST TECHNIQUE: Multidetector CT imaging of the cervical spine was performed without intravenous contrast. Multiplanar CT image reconstructions were also generated. COMPARISON:  Cervical spine CT dated 07/30/2017. FINDINGS: Alignment: Straightened cervical lordosis.  No spondylolisthesis. Skull base and vertebrae: No acute fracture. There has been an interbody fusion at the C4-C5 level. What appears to be a prosthetic disc is centered on the left anterior aspect of the C4-C5 vertebra. There is a small amount of air within the posterior aspect of the C4-C5 disc interspace. Soft tissue air lies anterior to the C4-C5 disc level with surrounding fluid attenuation, measuring approximately 1.8 cm from anterior to  posterior by 3.2 cm right to left. The superior to inferior extent is not well-defined. The fluid collection mildly displaces the laryngeal structures anteriorly. Residual uncovertebral spurring and mild facet spurring causes moderate bilateral neural foraminal narrowing at C4-C5. There is no definitive mass or fluid collection within the spinal canal, although the spinal canal structures are not well-defined. They are are no osteoblastic or osteolytic lesions. Soft tissues and spinal canal: No spinal canal mass or definitive fluid collection. No other paraspinal abnormality. Disc levels: Mild loss of disc height at C2-C3. Small anterior endplate osteophytes are noted from C2 through C6, bridging at C2-C3. No convincing disc herniation. Upper chest: No mass or adenopathy. No acute finding. Clear lung apices. Other: None. IMPRESSION: 1. Status post anterior interbody fusion at C4-C5. There is a prevertebral collection containing air centered at this level, measuring 1.8 cm from anterior to posterior, mildly deviating the laryngeal structures anteriorly. There is no convincing soft tissue mass or fluid collection within the spinal canal, although the spinal canal contents are poorly defined on CT. There is moderate bilateral neural foraminal narrowing at the fused level, which is increased when compared to the previous cervical spine CT. Electronically Signed   By: Lajean Manes M.D.   On: 05/08/2019 18:56    Assessment/Plan: Improved  LOS: 1 day  Per DrStern, d/c to home. Ok to shower and shave. Office f/u with Dr. Christella Noa already scheduled. Knows to call with questions or concerns. Plan to stop steroid, Norco and Robaxin will be eRx'ed to his pharmacy for prn home use.   Verdis Prime 05/10/2019, 7:51 AM   Left  deltoid pain and weakness is improved.  Discharge home today.  F/U with Dr. Franky Machoabbell in office this week.

## 2019-05-10 NOTE — Discharge Summary (Signed)
Physician Discharge Summary  Patient ID: OM LIZOTTE MRN: [id number] DOB/AGE: 01/17/1966 53 y.o.  Admit date: 05/08/2019 Discharge date: 05/10/2019  Admission Diagnoses: Dysphagia and left deltoid weakness, pain  Discharge Diagnoses: Dysphagia and left deltoid weakness, pain, now improved  Active Problems:   Dysphagia   Discharged Condition: good  Hospital Course: Casey Fye was admitted through the ED with dysphagia and left periscapular pain with left deltoid weakness. (Increasing s/s since cervical 4-5 arthroplasty by Dr. Christella Noa on June 10th. Treatment with steroids resolved dysphagia symptoms and strength is improving.  Consults:   Significant Diagnostic Studies: radiology: X-Ray: & CT  Treatments: steroids: Decadron  Discharge Exam: Blood pressure (!) 143/86, pulse 87, temperature 97.6 F (36.4 C), temperature source Oral, resp. rate 20, SpO2 97 %. Alert, conversant. MAEW. Left deltoid weaker than right, but improved. Triceps and hand intrinsics good bilat. Pt reports only posterior cervical soreness, noting resolved left arm pain. Incision healing nicely without erythema swelling or drainage,    Disposition: Discharge to home. Ok to shower and shave. Office f/u with Dr. Christella Noa already scheduled. Knows to call with questions or concerns. Plan to stop steroid, Norco and Robaxin will be eRx'ed to his pharmacy for prn home use.     Discharge Instructions    Diet - low sodium heart healthy   Complete by: As directed    Increase activity slowly   Complete by: As directed      Allergies as of 05/10/2019      Reactions   Bystolic [nebivolol Hcl] Palpitations, Other (See Comments)   "Made me have a small heart attack"   Demerol [meperidine] Itching   Is this an allergy ??? Patient stated he's allergic to "Dilaudid"   Dilaudid [hydromorphone] Itching      Medication List    TAKE these medications   albuterol 108 (90 Base) MCG/ACT  inhaler Commonly known as: VENTOLIN HFA Inhale 2 puffs into the lungs every 4 (four) hours as needed for wheezing or shortness of breath.   amLODipine 10 MG tablet Commonly known as: NORVASC Take 10 mg by mouth daily.   aspirin 81 MG chewable tablet Chew 1 tablet (81 mg total) by mouth daily.   cloNIDine 0.2 MG tablet Commonly known as: CATAPRES Take 0.2 mg by mouth at bedtime.   cyclobenzaprine 10 MG tablet Commonly known as: FLEXERIL Take 10 mg by mouth 3 (three) times daily as needed for muscle spasms.   Farxiga 10 MG Tabs tablet Generic drug: dapagliflozin propanediol Take 10 mg by mouth daily.   gabapentin 300 MG capsule Commonly known as: NEURONTIN Take 1 capsule (300 mg total) by mouth 3 (three) times daily. What changed: when to take this   Glucophage 500 MG tablet Generic drug: metFORMIN Take 500 mg by mouth 2 (two) times daily with a meal.   HYDROcodone-acetaminophen 5-325 MG tablet Commonly known as: NORCO/VICODIN Take 1-2 tablets by mouth every 4 (four) hours as needed for moderate pain.   ibuprofen 800 MG tablet Commonly known as: ADVIL Take 1 tablet (800 mg total) by mouth every 8 (eight) hours as needed. What changed:   when to take this  reasons to take this   labetalol 200 MG tablet Commonly known as: NORMODYNE Take 1 tablet (200 mg total) by mouth 2 (two) times daily. What changed: how much to take   Levemir FlexTouch 100 UNIT/ML Pen Generic drug: Insulin Detemir Inject 50 Units into the skin at bedtime.   nitroGLYCERIN 0.4 MG SL tablet Commonly  known as: NITROSTAT Place 1 tablet (0.4 mg total) under the tongue every 5 (five) minutes as needed for chest pain.   olmesartan 40 MG tablet Commonly known as: BENICAR Take 40 mg by mouth daily.   oxyCODONE 5 MG immediate release tablet Commonly known as: Oxy IR/ROXICODONE Take 5 mg by mouth 4 (four) times daily as needed (for pain).   oxyCODONE-acetaminophen 10-325 MG tablet Commonly known  as: PERCOCET Take 1 tablet by mouth 4 (four) times daily as needed for pain.   potassium chloride SA 20 MEQ tablet Commonly known as: K-DUR Take 1 tablet (20 mEq total) by mouth daily.   pravastatin 40 MG tablet Commonly known as: PRAVACHOL Take 40 mg by mouth daily.   tamsulosin 0.4 MG Caps capsule Commonly known as: FLOMAX Take 0.4 mg by mouth daily.        Signed: Dorian HeckleJoseph D Pryce Folts, MD 05/10/2019, 8:24 AM

## 2019-06-02 ENCOUNTER — Other Ambulatory Visit: Payer: Self-pay | Admitting: Orthopedic Surgery

## 2019-07-14 ENCOUNTER — Other Ambulatory Visit: Payer: Self-pay

## 2019-07-14 DIAGNOSIS — Z20822 Contact with and (suspected) exposure to covid-19: Secondary | ICD-10-CM

## 2019-07-15 LAB — NOVEL CORONAVIRUS, NAA: SARS-CoV-2, NAA: NOT DETECTED

## 2019-07-25 NOTE — Progress Notes (Signed)
[facility] Note  07/27/2019     CHIEF COMPLAINT Patient presents for Retina Follow Up   HISTORY OF PRESENT ILLNESS: Geoffrey Jackson is a 53 y.o. male who presents to the clinic today for:   HPI    Retina Follow Up    Patient presents with  Other.  In both eyes.  Severity is moderate.  Duration of 9 months.  Since onset it is stable.  I, the attending physician,  performed the HPI with the patient and updated documentation appropriately.          Comments    Pt presents for follow for retinal holes OU (s/p laser OS 11.04.19 and OD 11.21.19), pt states his eyes are doing okay since laser, he states he still has not gotten any new glasses, he denies new flashes, floaters or eye pain, pts last A1c was 7, he did not check his blood sugar this morning, but states it has been in range       Last edited by Bernarda Caffey, MD on 07/27/2019  8:23 AM. (History)    pt states   Referring physician: Madelin Headings, DO [address] [place],  [place] [address]  HISTORICAL INFORMATION:   Selected notes from the MEDICAL RECORD NUMBER Referred by Dr. Madelin Headings for concern of retinal hole OS LEE: 10.21.19 (M. Cotter) [BCVA: OD: 20/20- OS: 20/20-] Ocular Hx- PMH-DM (takes Toujeo and metformin), HTN    CURRENT MEDICATIONS: No current outpatient medications on file. (Ophthalmic Drugs)   No current facility-administered medications for this visit.  (Ophthalmic Drugs)   Current Outpatient Medications (Other)  Medication Sig  . albuterol (PROVENTIL HFA;VENTOLIN HFA) 108 (90 BASE) MCG/ACT inhaler Inhale 2 puffs into the lungs every 4 (four) hours as needed for wheezing or shortness of breath.  Marland Kitchen amLODipine (NORVASC) 10 MG tablet Take 10 mg by mouth daily.  Marland Kitchen aspirin 81 MG chewable tablet Chew 1 tablet (81 mg total) by mouth daily.  . cloNIDine (CATAPRES) 0.2 MG tablet Take 0.2 mg by mouth at bedtime.   . cyclobenzaprine (FLEXERIL) 10 MG tablet Take 10 mg by mouth  3 (three) times daily as needed for muscle spasms.  . dapagliflozin propanediol (FARXIGA) 10 MG TABS tablet Take 10 mg by mouth daily.  Marland Kitchen gabapentin (NEURONTIN) 300 MG capsule Take 1 capsule (300 mg total) by mouth daily with lunch.  Marland Kitchen HYDROcodone-acetaminophen (NORCO/VICODIN) 5-325 MG tablet Take 1-2 tablets by mouth every 4 (four) hours as needed for moderate pain.  Marland Kitchen ibuprofen (ADVIL,MOTRIN) 800 MG tablet Take 1 tablet (800 mg total) by mouth every 8 (eight) hours as needed. (Patient taking differently: Take 800 mg by mouth 3 (three) times daily as needed for headache, mild pain or moderate pain. )  . labetalol (NORMODYNE) 200 MG tablet Take 1 tablet (200 mg total) by mouth 2 (two) times daily. (Patient taking differently: Take 100 mg by mouth 2 (two) times daily. )  . LEVEMIR FLEXTOUCH 100 UNIT/ML Pen Inject 50 Units into the skin at bedtime.   . metFORMIN (GLUCOPHAGE) 500 MG tablet Take 500 mg by mouth 2 (two) times daily with a meal.   . nitroGLYCERIN (NITROSTAT) 0.4 MG SL tablet Place 1 tablet (0.4 mg total) under the tongue every 5 (five) minutes as needed for chest pain.  Marland Kitchen olmesartan (BENICAR) 40 MG tablet Take 40 mg by mouth daily.  Marland Kitchen oxyCODONE (OXY IR/ROXICODONE) 5 MG immediate release tablet Take 5 mg by mouth 4 (four) times  daily as needed (for pain).   Marland Kitchen. oxyCODONE-acetaminophen (PERCOCET) 10-325 MG tablet Take 1 tablet by mouth 4 (four) times daily as needed for pain.  . potassium chloride SA (K-DUR,KLOR-CON) 20 MEQ tablet Take 1 tablet (20 mEq total) by mouth daily.  . pravastatin (PRAVACHOL) 40 MG tablet Take 40 mg by mouth daily.  . tamsulosin (FLOMAX) 0.4 MG CAPS capsule Take 0.4 mg by mouth daily.    No current facility-administered medications for this visit.  (Other)      REVIEW OF SYSTEMS: ROS    Negative for: Constitutional, Gastrointestinal, Neurological, Skin, Genitourinary, Musculoskeletal, HENT, Endocrine, Cardiovascular, Eyes, Respiratory, Psychiatric,  Allergic/Imm, Heme/Lymph   Last edited by Rennis ChrisZamora, Don Giarrusso, MD on 07/27/2019  9:24 AM. (History)       ALLERGIES Allergies  Allergen Reactions  . Bystolic [Nebivolol Hcl] Palpitations and Other (See Comments)    "Made me have a small heart attack"  . Demerol [Meperidine] Itching    Is this an allergy ??? Patient stated he's allergic to "Dilaudid"  . Dilaudid [Hydromorphone] Itching    PAST MEDICAL HISTORY Past Medical History:  Diagnosis Date  . Benign prostatic hypertrophy   . Essential hypertension   . GERD (gastroesophageal reflux disease)   . Low back pain   . Noncompliance with medications   . Obesity   . OSA (obstructive sleep apnea)    Non-compliant with CPAP  . Type 2 diabetes mellitus (HCC)    Past Surgical History:  Procedure Laterality Date  . BICEPT TENODESIS Right 11/10/2014   Procedure: BICEPS TENODESIS;  Surgeon: Vickki HearingStanley E Harrison, MD;  Location: [facility];  Service: Orthopedics;  Laterality: Right;  biceps tendon  . SHOULDER ARTHROSCOPY    . SHOULDER ARTHROSCOPY  05/21/2012   Procedure: ARTHROSCOPY SHOULDER;  Surgeon: Vickki HearingStanley E Harrison, MD;  Location: [facility];  Service: Orthopedics;  Laterality: Right;  . SPINE SURGERY     Dr Danielle DessElsner   . SUBACROMIAL DECOMPRESSION  05/21/2012   Procedure: SUBACROMIAL DECOMPRESSION;  Surgeon: Vickki HearingStanley E Harrison, MD;  Location: [facility];  Service: Orthopedics;  Laterality: Right;    FAMILY HISTORY Family History  Problem Relation Age of Onset  . Diabetes Father   . Hypertension Father   . Heart failure Father   . Kidney disease Father   . Diabetes Sister   . Hypertension Sister   . Hypertension Mother     SOCIAL HISTORY Social History   Tobacco Use  . Smoking status: Never Smoker  . Smokeless tobacco: Never Used  Substance Use Topics  . Alcohol use: Yes    Alcohol/week: 0.0 standard drinks    Comment: Occasionally  . Drug use: No         OPHTHALMIC EXAM:  Base Eye Exam    Visual Acuity (Snellen - Linear)       Right Left   Dist [place] 20/30 +2 20/40   Dist ph [place] 20/25 +2 20/25       Tonometry (Tonopen, 8:34 AM)      Right Left   Pressure 21 26       Tonometry #2 (Tonopen, 8:34 AM)      Right Left   Pressure  22       Pupils      Dark Light Shape React APD   Right 5 4 Round Brisk None   Left 5 4 Round Brisk None       Visual Fields (Counting fingers)      Left Right    Full  Full       Extraocular Movement      Right Left    Full, Ortho Full, Ortho       Neuro/Psych    Oriented x3: Yes   Mood/Affect: Normal       Dilation    Both eyes: 1.0% Mydriacyl, 2.5% Phenylephrine @ 8:34 AM        Slit Lamp and Fundus Exam    Slit Lamp Exam      Right Left   Lids/Lashes mild Dermatochalasis - upper lid, Ptosis mild Dermatochalasis - upper lid, mild Meibomian gland dysfunction   Conjunctiva/Sclera mild Melanosis, mild nasal Pinguecula mild Melanosis, nasal Pinguecula   Cornea Arcus, 1+ Punctate epithelial erosions Arcus, 1+ Punctate epithelial erosions   Anterior Chamber Deep and quiet Deep and quiet   Iris Round and dilated, No NVI Round and dilated, No NVI   Lens 2+ Nuclear sclerosis, 2+ Cortical cataract 2+ Nuclear sclerosis, 2+ Cortical cataract   Vitreous mild Vitreous syneresis mild Vitreous syneresis       Fundus Exam      Right Left   Disc Pink and Sharp Pink and Sharp   C/D Ratio 0.55 0.5   Macula Flat, Good foveal reflex, mild Retinal pigment epithelial mottling, No heme or edema Flat, Good foveal reflex, mild Retinal pigment epithelial mottling, No heme or edema   Vessels Vascular attenuation, mild AV crossing changes Vascular attenuation, mild AV crossing changes   Periphery Attached, peripheral cystoid degeneration inferiorly, small break at 0900 - no SRF, area of focal lattice with atrophic hole at 1000, pigmented break at 0430 and 630 -- good laser changes around all lesions Attached, round hole at 0300 ora--no SRF -- good laser changes, patch of lattice at 0430 with  atrophic holes almost to ora -- good laser surrounding, atrophic hole 0730 -- good laser          IMAGING AND PROCEDURES  Imaging and Procedures for @TODAY @  OCT, Retina - OU - Both Eyes       Right Eye Quality was good. Central Foveal Thickness: 264. Progression has been stable. Findings include normal foveal contour, no SRF, no IRF (Focal thinning stable from prior).   Left Eye Quality was good. Central Foveal Thickness: 275. Progression has been stable. Findings include normal foveal contour, no SRF, no IRF (Focal thinning, stable from prior).   Notes *Images captured and stored on drive  Diagnosis / Impression:  NFP, No IRF/SRF Focal thinning OU -- mild, stable from prior   Clinical management:  See below  Abbreviations: NFP - Normal foveal profile. CME - cystoid macular edema. PED - pigment epithelial detachment. IRF - intraretinal fluid. SRF - subretinal fluid. EZ - ellipsoid zone. ERM - epiretinal membrane. ORA - outer retinal atrophy. ORT - outer retinal tubulation. SRHM - subretinal hyper-reflective material                 ASSESSMENT/PLAN:    ICD-10-CM   1. Retinal holes, bilateral  H33.323   2. Diabetes mellitus type 2 without retinopathy (HCC)  E11.9   3. Retinal edema  H35.81 OCT, Retina - OU - Both Eyes  4. Essential hypertension  I10   5. Hypertensive retinopathy of both eyes  H35.033   6. Combined forms of age-related cataract of both eyes  H25.813     1. Retinal holes, OU    - OD: 0430, 0630, 0900, 1000   - OS: 0300, 0430, 0730   - no SRF or  RD  - s/p laser retinopexy OS (11.04.19) -- good laser surrounding all lesions  - s/p laser retinopexy OD (11.21.19) -- good laser surrounding all lesions  - no new RT/RD  - pt is cleared from a retina standpoint for release back to Dr. Charise Killianotter for primary eye care and f/u  2. Diabetes mellitus, type 2 without retinopathy OU  - The incidence, risk factors for progression, natural history and treatment  options for diabetic retinopathy were discussed with patient.    - The need for close monitoring of blood glucose, blood pressure, and serum lipids, avoiding cigarette or any type of tobacco, and the need for long term follow up was also discussed with patient.  - f/u in 1 year, sooner prn  3. No retinal edema on exam or OCT  4,5. Hypertensive retinopathy OU  - discussed importance of tight BP control  - monitor  6. Mixed Cataracts OU  - The symptoms of cataract, surgical options, and treatments and risks were discussed with patient.  - discussed diagnosis and progression  - not yet visually significant  - monitor for now   Ophthalmic Meds Ordered this visit:  No orders of the defined types were placed in this encounter.      Return if symptoms worsen or fail to improve.  There are no Patient Instructions on file for this visit.   Explained the diagnoses, plan, and follow up with the patient and they expressed understanding.  Patient expressed understanding of the importance of proper follow up care.   This document serves as a record of services personally performed by Karie ChimeraBrian G. Jaren Vanetten, MD, PhD. It was created on their behalf by Annalee Gentaaryl Barber, COMT. The creation of this record is the provider's dictation and/or activities during the visit.  Electronically signed by: Annalee Gentaaryl Barber, COMT 07/27/19 9:31 AM   This document serves as a record of services personally performed by Karie ChimeraBrian G. Darrly Loberg, MD, PhD. It was created on their behalf by Laurian BrimAmanda Brown, OA, an ophthalmic assistant. The creation of this record is the provider's dictation and/or activities during the visit.    Electronically signed by: Laurian BrimAmanda Brown, OA  09.02.2020 9:31 AM    Karie ChimeraBrian G. Miral Hoopes, M.D., Ph.D. Diseases & Surgery of the Retina and Vitreous [facility]  I have reviewed the above documentation for accuracy and completeness, and I agree with the above. Karie ChimeraBrian G. Amrit Cress, M.D., Ph.D. 07/27/19  9:31 AM     Abbreviations: M myopia (nearsighted); A astigmatism; H hyperopia (farsighted); P presbyopia; Mrx spectacle prescription;  CTL contact lenses; OD right eye; OS left eye; OU both eyes  XT exotropia; ET esotropia; PEK punctate epithelial keratitis; PEE punctate epithelial erosions; DES dry eye syndrome; MGD meibomian gland dysfunction; ATs artificial tears; PFAT's preservative free artificial tears; NSC nuclear sclerotic cataract; PSC posterior subcapsular cataract; ERM epi-retinal membrane; PVD posterior vitreous detachment; RD retinal detachment; DM diabetes mellitus; DR diabetic retinopathy; NPDR non-proliferative diabetic retinopathy; PDR proliferative diabetic retinopathy; CSME clinically significant macular edema; DME diabetic macular edema; dbh dot blot hemorrhages; CWS cotton wool spot; POAG primary open angle glaucoma; C/D cup-to-disc ratio; HVF humphrey visual field; GVF goldmann visual field; OCT optical coherence tomography; IOP intraocular pressure; BRVO Branch retinal vein occlusion; CRVO central retinal vein occlusion; CRAO central retinal artery occlusion; BRAO branch retinal artery occlusion; RT retinal tear; SB scleral buckle; PPV pars plana vitrectomy; VH Vitreous hemorrhage; PRP panretinal laser photocoagulation; IVK intravitreal kenalog; VMT vitreomacular traction; MH Macular hole;  NVD  neovascularization of the disc; NVE neovascularization elsewhere; AREDS age related eye disease study; ARMD age related macular degeneration; POAG primary open angle glaucoma; EBMD epithelial/anterior basement membrane dystrophy; ACIOL anterior chamber intraocular lens; IOL intraocular lens; PCIOL posterior chamber intraocular lens; Phaco/IOL phacoemulsification with intraocular lens placement; [place] photorefractive keratectomy; LASIK laser assisted in situ keratomileusis; HTN hypertension; DM diabetes mellitus; COPD chronic obstructive pulmonary disease

## 2019-07-27 ENCOUNTER — Other Ambulatory Visit: Payer: Self-pay

## 2019-07-27 ENCOUNTER — Ambulatory Visit (INDEPENDENT_AMBULATORY_CARE_PROVIDER_SITE_OTHER): Payer: BC Managed Care – PPO | Admitting: Ophthalmology

## 2019-07-27 ENCOUNTER — Encounter (INDEPENDENT_AMBULATORY_CARE_PROVIDER_SITE_OTHER): Payer: Self-pay | Admitting: Ophthalmology

## 2019-07-27 DIAGNOSIS — H3581 Retinal edema: Secondary | ICD-10-CM | POA: Diagnosis not present

## 2019-07-27 DIAGNOSIS — H33323 Round hole, bilateral: Secondary | ICD-10-CM | POA: Diagnosis not present

## 2019-07-27 DIAGNOSIS — I10 Essential (primary) hypertension: Secondary | ICD-10-CM

## 2019-07-27 DIAGNOSIS — H25813 Combined forms of age-related cataract, bilateral: Secondary | ICD-10-CM

## 2019-07-27 DIAGNOSIS — E119 Type 2 diabetes mellitus without complications: Secondary | ICD-10-CM

## 2019-07-27 DIAGNOSIS — H35033 Hypertensive retinopathy, bilateral: Secondary | ICD-10-CM

## 2019-07-29 ENCOUNTER — Other Ambulatory Visit: Payer: Self-pay | Admitting: Neurosurgery

## 2019-07-29 DIAGNOSIS — M4722 Other spondylosis with radiculopathy, cervical region: Secondary | ICD-10-CM

## 2019-08-25 ENCOUNTER — Other Ambulatory Visit: Payer: Self-pay

## 2019-08-25 ENCOUNTER — Ambulatory Visit
Admission: RE | Admit: 2019-08-25 | Discharge: 2019-08-25 | Disposition: A | Discharge status: Home / Self Care | Payer: BC Managed Care – PPO | Source: Ambulatory Visit | Attending: Neurosurgery | Admitting: Neurosurgery

## 2019-08-25 DIAGNOSIS — M4722 Other spondylosis with radiculopathy, cervical region: Secondary | ICD-10-CM

## 2019-10-03 ENCOUNTER — Telehealth: Payer: Self-pay | Admitting: Orthopedic Surgery

## 2019-10-03 NOTE — Telephone Encounter (Signed)
Patient came by office to inquire about recommendation by Dr Christella Noa for epidural steroid injections; states thought he can come back here for those. Report had been faxed here; has been sent to scanning. Clinical staff spoke with patient relaying that our clinic does not perform these services. Michela Pitcher he will call back to Dr Cabbell/[facility] to follow up on what he needs to do. Patient voiced understanding.

## 2020-02-17 ENCOUNTER — Ambulatory Visit: Payer: BC Managed Care – PPO | Attending: Internal Medicine

## 2020-02-17 DIAGNOSIS — Z23 Encounter for immunization: Secondary | ICD-10-CM

## 2020-02-17 NOTE — Progress Notes (Signed)
   Covid-19 Vaccination Clinic  Name:  Geoffrey Jackson    MRN: [id number] DOB: 1965/12/31  02/17/2020  Mr. Geoffrey Jackson was observed post Covid-19 immunization for 15 minutes without incident. He was provided with Vaccine Information Sheet and instruction to access the V-Safe system.   Mr. Geoffrey Jackson was instructed to call 911 with any severe reactions post vaccine: Marland Kitchen Difficulty breathing  . Swelling of face and throat  . A fast heartbeat  . A bad rash all over body  . Dizziness and weakness   Immunizations Administered    Name Date Dose VIS Date Route   Moderna COVID-19 Vaccine 02/17/2020 10:36 AM 0.5 mL 10/25/2019 Intramuscular   Manufacturer: Moderna   Lot: [id number]   NDC: 91638-466-59

## 2020-03-20 ENCOUNTER — Ambulatory Visit: Payer: BC Managed Care – PPO | Attending: Internal Medicine

## 2020-03-20 DIAGNOSIS — Z23 Encounter for immunization: Secondary | ICD-10-CM

## 2020-03-20 NOTE — Progress Notes (Signed)
   Covid-19 Vaccination Clinic  Name:  Geoffrey Jackson    MRN: [id number] DOB: 02-03-1966  03/20/2020  Geoffrey Jackson was observed post Covid-19 immunization for 15 minutes without incident. He was provided with Vaccine Information Sheet and instruction to access the V-Safe system.   Geoffrey Jackson was instructed to call 911 with any severe reactions post vaccine: Marland Kitchen Difficulty breathing  . Swelling of face and throat  . A fast heartbeat  . A bad rash all over body  . Dizziness and weakness   Immunizations Administered    Name Date Dose VIS Date Route   Moderna COVID-19 Vaccine 03/20/2020 12:50 PM 0.5 mL 10/2019 Intramuscular   Manufacturer: Moderna   Lot: [id number]   NDC: 00459-977-41

## 2020-11-28 ENCOUNTER — Other Ambulatory Visit: Payer: Self-pay | Admitting: Neurosurgery

## 2020-11-28 DIAGNOSIS — M4722 Other spondylosis with radiculopathy, cervical region: Secondary | ICD-10-CM

## 2020-12-18 ENCOUNTER — Ambulatory Visit
Admission: RE | Admit: 2020-12-18 | Discharge: 2020-12-18 | Disposition: A | Discharge status: Home / Self Care | Payer: BLUE CROSS/BLUE SHIELD | Source: Ambulatory Visit | Attending: Neurosurgery | Admitting: Neurosurgery

## 2020-12-18 ENCOUNTER — Other Ambulatory Visit: Payer: Self-pay

## 2020-12-18 DIAGNOSIS — M4722 Other spondylosis with radiculopathy, cervical region: Secondary | ICD-10-CM

## 2021-01-09 ENCOUNTER — Other Ambulatory Visit: Payer: Self-pay | Admitting: Neurosurgery

## 2021-01-10 ENCOUNTER — Other Ambulatory Visit: Payer: Self-pay | Admitting: Neurosurgery

## 2021-01-10 DIAGNOSIS — M4722 Other spondylosis with radiculopathy, cervical region: Secondary | ICD-10-CM

## 2021-01-30 ENCOUNTER — Ambulatory Visit
Admission: RE | Admit: 2021-01-30 | Discharge: 2021-01-30 | Disposition: A | Discharge status: Home / Self Care | Payer: BLUE CROSS/BLUE SHIELD | Source: Ambulatory Visit | Attending: Neurosurgery | Admitting: Neurosurgery

## 2021-01-30 DIAGNOSIS — M4722 Other spondylosis with radiculopathy, cervical region: Secondary | ICD-10-CM

## 2021-07-31 ENCOUNTER — Emergency Department (HOSPITAL_COMMUNITY): Payer: BC Managed Care – PPO

## 2021-07-31 ENCOUNTER — Encounter (HOSPITAL_COMMUNITY): Payer: Self-pay

## 2021-07-31 ENCOUNTER — Emergency Department (HOSPITAL_COMMUNITY)
Admission: EM | Admit: 2021-07-31 | Discharge: 2021-07-31 | Disposition: A | Discharge status: Home / Self Care | Payer: BC Managed Care – PPO | Attending: Emergency Medicine | Admitting: Emergency Medicine

## 2021-07-31 ENCOUNTER — Other Ambulatory Visit: Payer: Self-pay

## 2021-07-31 DIAGNOSIS — I10 Essential (primary) hypertension: Secondary | ICD-10-CM | POA: Insufficient documentation

## 2021-07-31 DIAGNOSIS — Z79899 Other long term (current) drug therapy: Secondary | ICD-10-CM | POA: Diagnosis not present

## 2021-07-31 DIAGNOSIS — Z7984 Long term (current) use of oral hypoglycemic drugs: Secondary | ICD-10-CM | POA: Insufficient documentation

## 2021-07-31 DIAGNOSIS — R42 Dizziness and giddiness: Secondary | ICD-10-CM

## 2021-07-31 DIAGNOSIS — Z7982 Long term (current) use of aspirin: Secondary | ICD-10-CM | POA: Insufficient documentation

## 2021-07-31 DIAGNOSIS — E119 Type 2 diabetes mellitus without complications: Secondary | ICD-10-CM | POA: Insufficient documentation

## 2021-07-31 DIAGNOSIS — R519 Headache, unspecified: Secondary | ICD-10-CM

## 2021-07-31 LAB — CBG MONITORING, ED: Glucose-Capillary: 122 mg/dL — ABNORMAL HIGH (ref 70–99)

## 2021-07-31 LAB — CBC WITH DIFFERENTIAL/PLATELET
Abs Immature Granulocytes: 0.02 10*3/uL (ref 0.00–0.07)
Basophils Absolute: 0 10*3/uL (ref 0.0–0.1)
Basophils Relative: 1 %
Eosinophils Absolute: 0.1 10*3/uL (ref 0.0–0.5)
Eosinophils Relative: 2 %
HCT: 46.5 % (ref 39.0–52.0)
Hemoglobin: 14.9 g/dL (ref 13.0–17.0)
Immature Granulocytes: 0 %
Lymphocytes Relative: 39 %
Lymphs Abs: 2.2 10*3/uL (ref 0.7–4.0)
MCH: 30.2 pg (ref 26.0–34.0)
MCHC: 32 g/dL (ref 30.0–36.0)
MCV: 94.1 fL (ref 80.0–100.0)
Monocytes Absolute: 0.6 10*3/uL (ref 0.1–1.0)
Monocytes Relative: 11 %
Neutro Abs: 2.7 10*3/uL (ref 1.7–7.7)
Neutrophils Relative %: 47 %
Platelets: 201 10*3/uL (ref 150–400)
RBC: 4.94 MIL/uL (ref 4.22–5.81)
RDW: 13.5 % (ref 11.5–15.5)
WBC: 5.6 10*3/uL (ref 4.0–10.5)
nRBC: 0 % (ref 0.0–0.2)

## 2021-07-31 LAB — COMPREHENSIVE METABOLIC PANEL
ALT: 37 U/L (ref 0–44)
AST: 27 U/L (ref 15–41)
Albumin: 4.2 g/dL (ref 3.5–5.0)
Alkaline Phosphatase: 74 U/L (ref 38–126)
Anion gap: 7 (ref 5–15)
BUN: 19 mg/dL (ref 6–20)
CO2: 24 mmol/L (ref 22–32)
Calcium: 8.8 mg/dL — ABNORMAL LOW (ref 8.9–10.3)
Chloride: 109 mmol/L (ref 98–111)
Creatinine, Ser: 0.99 mg/dL (ref 0.61–1.24)
GFR, Estimated: >60 mL/min (ref >60)
Glucose, Bld: 133 mg/dL — ABNORMAL HIGH (ref 70–99)
Potassium: 3.4 mmol/L — ABNORMAL LOW (ref 3.5–5.1)
Sodium: 140 mmol/L (ref 135–145)
Total Bilirubin: 0.5 mg/dL (ref 0.3–1.2)
Total Protein: 7.3 g/dL (ref 6.5–8.1)

## 2021-07-31 LAB — TROPONIN I (HIGH SENSITIVITY): Troponin I (High Sensitivity): 9 ng/L (ref <18)

## 2021-07-31 MED ORDER — DIPHENHYDRAMINE HCL 50 MG/ML IJ SOLN
12.5000 mg | Freq: Once | INTRAMUSCULAR | Status: AC
  Administered 2021-07-31: 12.5 mg via INTRAVENOUS
  Filled 2021-07-31: qty 1

## 2021-07-31 MED ORDER — KETOROLAC TROMETHAMINE 30 MG/ML IJ SOLN
30.0000 mg | Freq: Once | INTRAMUSCULAR | Status: AC
  Administered 2021-07-31: 30 mg via INTRAVENOUS
  Filled 2021-07-31: qty 1

## 2021-07-31 MED ORDER — METOCLOPRAMIDE HCL 5 MG/ML IJ SOLN
10.0000 mg | Freq: Once | INTRAMUSCULAR | Status: AC
  Administered 2021-07-31: 10 mg via INTRAVENOUS
  Filled 2021-07-31: qty 2

## 2021-07-31 MED ORDER — FENTANYL CITRATE PF 50 MCG/ML IJ SOSY
100.0000 ug | PREFILLED_SYRINGE | Freq: Once | INTRAMUSCULAR | Status: AC
  Administered 2021-07-31: 100 ug via INTRAVENOUS
  Filled 2021-07-31: qty 2

## 2021-07-31 NOTE — ED Triage Notes (Signed)
Pt to er, pt states that he is here for blurry vision, dizziness and tinging in his L hand, states that this has been going on for the past 3 days.  Pt denies confusion or slurred speech.  Pt states that he has dm, but his sugar has been normal.

## 2021-07-31 NOTE — Discharge Instructions (Addendum)
You were seen in the emergency department for evaluation of headache blurry vision and dizziness.  Your blood pressure was elevated here and this will need to be followed up with a primary care doctor.  You had blood work and a CAT scan of your head that did not show any significant findings.  Please take your blood pressure medicine as directed.  Return to the emergency department if any worsening or concerning symptoms

## 2021-07-31 NOTE — ED Provider Notes (Signed)
[facility] EMERGENCY DEPARTMENT Provider Note   CSN: [id number] Arrival date & time: 07/31/21  1727     History Chief Complaint  Patient presents with   Dizziness    Geoffrey Jackson is a 55 y.o. male.  He is here with a complaint of intermittent 10 out of 10 headache along with dizziness which he describes as blurry vision and difficulty looking at the lights.  This started yesterday.  His headache improved this morning but recurred again today around 3 PM.  Blood pressure elevated here.  He is also noticed some tingling in his left hand.  No double vision weakness chest pain shortness of breath abdominal pain vomiting diarrhea.  No change in his regular medications.  The history is provided by the patient.  Dizziness Quality:  Lightheadedness Severity:  Moderate Onset quality:  Gradual Duration:  2 days Timing:  Intermittent Progression:  Unchanged Chronicity:  New Relieved by:  None tried Worsened by:  Nothing Ineffective treatments:  None tried Associated symptoms: headaches and vision changes   Associated symptoms: no chest pain, no nausea, no shortness of breath and no vomiting   Headaches:    Severity:  Severe   Onset quality:  Gradual   Duration:  2 days   Timing:  Intermittent   Progression:  Unchanged   Chronicity:  Recurrent Risk factors: multiple medications   Risk factors: no new medications       Past Medical History:  Diagnosis Date   Benign prostatic hypertrophy    Essential hypertension    GERD (gastroesophageal reflux disease)    Low back pain    Noncompliance with medications    Obesity    OSA (obstructive sleep apnea)    Non-compliant with CPAP   Type 2 diabetes mellitus (HCC)     Patient Active Problem List   Diagnosis Date Noted   Dysphagia 05/08/2019   Syncope 07/30/2017   Nausea, vomiting, and diarrhea 12/17/2015   Headache 12/17/2015   Hypertensive emergency 12/17/2015   CAP (community acquired pneumonia) 06/15/2015   Elevated  troponin I level 06/15/2015   Biceps tendonitis on right    Tear of biceps muscle 10/25/2013   Right shoulder pain 10/25/2013   Chest pain 04/13/2013   Diabetes mellitus type 2 in obese (HCC) 02/09/2007   Hyperlipidemia 12/08/2006   Obesity 12/08/2006   Essential hypertension 12/08/2006   GERD 12/08/2006   BENIGN PROSTATIC HYPERTROPHY 12/08/2006    Past Surgical History:  Procedure Laterality Date   BICEPT TENODESIS Right 11/10/2014   Procedure: BICEPS TENODESIS;  Surgeon: [name], MD;  Location: [facility];  Service: Orthopedics;  Laterality: Right;  biceps tendon   SHOULDER ARTHROSCOPY     SHOULDER ARTHROSCOPY  05/21/2012   Procedure: ARTHROSCOPY SHOULDER;  Surgeon: [name], MD;  Location: [facility];  Service: Orthopedics;  Laterality: Right;   SPINE SURGERY     Dr Danielle Dess    SUBACROMIAL DECOMPRESSION  05/21/2012   Procedure: SUBACROMIAL DECOMPRESSION;  Surgeon: [name], MD;  Location: [facility];  Service: Orthopedics;  Laterality: Right;       Family History  Problem Relation Age of Onset   Diabetes Father    Hypertension Father    Heart failure Father    Kidney disease Father    Diabetes Sister    Hypertension Sister    Hypertension Mother     Social History   Tobacco Use   Smoking status: Never   Smokeless tobacco: Never  Vaping Use  Vaping Use: Never used  Substance Use Topics   Alcohol use: Yes    Alcohol/week: 0.0 standard drinks    Comment: Occasionally   Drug use: No    Home Medications Prior to Admission medications   Medication Sig Start Date End Date Taking? Authorizing Provider  albuterol (PROVENTIL HFA;VENTOLIN HFA) 108 (90 BASE) MCG/ACT inhaler Inhale 2 puffs into the lungs every 4 (four) hours as needed for wheezing or shortness of breath. 06/21/15   Idol, Raynelle FanningJulie, PA-C  amLODipine (NORVASC) 10 MG tablet Take 10 mg by mouth daily. 11/15/15   [provider]  aspirin 81 MG chewable tablet Chew 1 tablet (81 mg total)  by mouth daily. 08/02/17   Oval Linseyondiego, Richard, MD  cloNIDine (CATAPRES) 0.2 MG tablet Take 0.2 mg by mouth at bedtime.  04/28/18   [provider]  cyclobenzaprine (FLEXERIL) 10 MG tablet Take 10 mg by mouth 3 (three) times daily as needed for muscle spasms. 03/28/19   [provider]  dapagliflozin propanediol (FARXIGA) 10 MG TABS tablet Take 10 mg by mouth daily.    [provider]  gabapentin (NEURONTIN) 300 MG capsule Take 1 capsule (300 mg total) by mouth daily with lunch. 06/03/19   Vickki HearingHarrison, Stanley E, MD  HYDROcodone-acetaminophen (NORCO/VICODIN) 5-325 MG tablet Take 1-2 tablets by mouth every 4 (four) hours as needed for moderate pain. 05/10/19   Maeola HarmanStern, Joseph, MD  ibuprofen (ADVIL,MOTRIN) 800 MG tablet Take 1 tablet (800 mg total) by mouth every 8 (eight) hours as needed. Patient taking differently: Take 800 mg by mouth 3 (three) times daily as needed for headache, mild pain or moderate pain.  07/14/18   Vickki HearingHarrison, Stanley E, MD  labetalol (NORMODYNE) 200 MG tablet Take 1 tablet (200 mg total) by mouth 2 (two) times daily. Patient taking differently: Take 100 mg by mouth 2 (two) times daily.  08/01/17   Oval Linseyondiego, Richard, MD  LEVEMIR FLEXTOUCH 100 UNIT/ML Pen Inject 50 Units into the skin at bedtime.  09/26/18   [provider]  metFORMIN (GLUCOPHAGE) 500 MG tablet Take 500 mg by mouth 2 (two) times daily with a meal.     [provider]  nitroGLYCERIN (NITROSTAT) 0.4 MG SL tablet Place 1 tablet (0.4 mg total) under the tongue every 5 (five) minutes as needed for chest pain. 06/19/15   Oval Linseyondiego, Richard, MD  olmesartan (BENICAR) 40 MG tablet Take 40 mg by mouth daily.    [provider]  oxyCODONE (OXY IR/ROXICODONE) 5 MG immediate release tablet Take 5 mg by mouth 4 (four) times daily as needed (for pain).     [provider]  oxyCODONE-acetaminophen (PERCOCET) 10-325 MG tablet Take 1 tablet by mouth 4 (four) times daily as needed for pain.     [provider]  potassium chloride SA (K-DUR,KLOR-CON) 20 MEQ tablet Take 1 tablet (20 mEq total) by mouth daily. 08/01/17   Oval Linseyondiego, Richard, MD  pravastatin (PRAVACHOL) 40 MG tablet Take 40 mg by mouth daily.    [provider]  tamsulosin (FLOMAX) 0.4 MG CAPS capsule Take 0.4 mg by mouth daily.  11/15/15   [provider]    Allergies    Bystolic [nebivolol hcl], Demerol [meperidine], and Dilaudid [hydromorphone]  Review of Systems   Review of Systems  Constitutional:  Negative for fever.  HENT:  Negative for sore throat.   Eyes:  Positive for visual disturbance.  Respiratory:  Negative for shortness of breath.   Cardiovascular:  Negative for chest pain.  Gastrointestinal:  Negative for abdominal pain, nausea and vomiting.  Genitourinary:  Negative for dysuria.  Musculoskeletal:  Negative for gait problem.  Skin:  Negative for rash.  Neurological:  Positive for dizziness and headaches.   Physical Exam Updated Vital Signs BP (!) 191/118 (BP Location: Right Arm)   Pulse 82   Temp 98.7 F (37.1 C) (Oral)   Resp (!) 22   Ht 6\' 1"  (1.854 m)   Wt 110.2 kg   SpO2 100%   BMI 32.06 kg/m   Physical Exam Vitals and nursing note reviewed.  Constitutional:      Appearance: Normal appearance. He is well-developed.  HENT:     Head: Normocephalic and atraumatic.  Eyes:     Conjunctiva/sclera: Conjunctivae normal.  Cardiovascular:     Rate and Rhythm: Normal rate and regular rhythm.     Heart sounds: No murmur heard. Pulmonary:     Effort: Pulmonary effort is normal. No respiratory distress.     Breath sounds: Normal breath sounds.  Abdominal:     Palpations: Abdomen is soft.     Tenderness: There is no abdominal tenderness.  Musculoskeletal:        General: No deformity or signs of injury. Normal range of motion.     Cervical back: Neck supple.  Skin:    General: Skin is warm and dry.  Neurological:     General: No focal deficit present.      Mental Status: He is alert and oriented to person, place, and time.     Cranial Nerves: No cranial nerve deficit.     Sensory: No sensory deficit.     Motor: No weakness.     Gait: Gait normal.    ED Results / Procedures / Treatments   Labs (all labs ordered are listed, but only abnormal results are displayed) Labs Reviewed  COMPREHENSIVE METABOLIC PANEL - Abnormal; Notable for the following components:      Result Value   Potassium 3.4 (*)    Glucose, Bld 133 (*)    Calcium 8.8 (*)    All other components within normal limits  CBG MONITORING, ED - Abnormal; Notable for the following components:   Glucose-Capillary 122 (*)    All other components within normal limits  CBC WITH DIFFERENTIAL/PLATELET  TROPONIN I (HIGH SENSITIVITY)    EKG EKG Interpretation  Date/Time:  Wednesday July 31 2021 19:41:18 EDT Ventricular Rate:  74 PR Interval:  154 QRS Duration: 100 QT Interval:  445 QTC Calculation: 494 R Axis:   -71 Text Interpretation: Sinus rhythm LAD, consider left anterior fascicular block Abnormal R-wave progression, late transition Borderline prolonged QT interval No significant change since prior 9/18 Confirmed by 10/18 [phone]) on 07/31/2021 7:53:26 PM  Radiology CT Head Wo Contrast  Result Date: 07/31/2021 CLINICAL DATA:  Headache blurry vision EXAM: CT HEAD WITHOUT CONTRAST TECHNIQUE: Contiguous axial images were obtained from the base of the skull through the vertex without intravenous contrast. COMPARISON:  CT brain 07/30/2017 FINDINGS: Brain: No evidence of acute infarction, hemorrhage, hydrocephalus, extra-axial collection or mass lesion/mass effect. Vascular: No hyperdense vessel or unexpected calcification. Skull: Normal. Negative for fracture or focal lesion. Sinuses/Orbits: Mucosal thickening in the sinuses Other: None. IMPRESSION: Negative non contrasted CT appearance of the brain Electronically Signed   By: 09/29/2017 M.D.   On: 07/31/2021 20:42     Procedures Procedures   Medications Ordered in ED Medications  fentaNYL (SUBLIMAZE) injection 100 mcg (100 mcg Intravenous Given 07/31/21 2020)  metoCLOPramide (REGLAN)  injection 10 mg (10 mg Intravenous Given 07/31/21 2104)  ketorolac (TORADOL) 30 MG/ML injection 30 mg (30 mg Intravenous Given 07/31/21 2103)  diphenhydrAMINE (BENADRYL) injection 12.5 mg (12.5 mg Intravenous Given 07/31/21 2103)    ED Course  I have reviewed the triage vital signs and the nursing notes.  Pertinent labs & imaging results that were available during my care of the patient were reviewed by me and considered in my medical decision making (see chart for details).  Clinical Course as of 08/01/21 1032  Wed Jul 31, 2021  2154 Patient states his headache and dizziness are improved.  He feels well enough to go home.  Blood pressures remain elevated.  Counseled to take his medications and follow-up with his primary care doctor. [MB]    Clinical Course User Index [MB] Terrilee Files, MD   MDM Rules/Calculators/A&P                          This patient complains of headache dizziness elevated blood pressure photophobia; this involves an extensive number of treatment Options and is a complaint that carries with it a high risk of complications and Morbidity. The differential includes migraine, hypertensive emergency, intracranial bleed, stroke, vertigo  I ordered, reviewed and interpreted labs, which included CBC with normal white count normal hemoglobin, chemistries fairly normal glucose and low potassium, troponin unremarkable I ordered medication IV pain medicine, migraine cocktail with improvement in the symptoms I ordered imaging studies which included CT head and I independently    visualized and interpreted imaging which showed no acute findings Previous records obtained and reviewed in epic no recent missions.  Blood pressures elevated but been so in the past.   After the interventions stated above, I  reevaluated the patient and found patient symptoms to be improved.  He is neurologically intact steady in gait.  Reviewed work-up with him.  Counseled to closely follow-up with primary care doctor regarding elevated blood pressures.  Return instructions discussed   Final Clinical Impression(s) / ED Diagnoses Final diagnoses:  Dizziness  Primary hypertension  Generalized headache    Rx / DC Orders ED Discharge Orders     None        Terrilee Files, MD 08/01/21 1041

## 2021-07-31 NOTE — ED Notes (Signed)
ED Provider at bedside. 

## 2021-07-31 NOTE — ED Notes (Signed)
Patient transported to CT 

## 2021-09-22 ENCOUNTER — Other Ambulatory Visit: Payer: Self-pay

## 2021-09-22 ENCOUNTER — Emergency Department (HOSPITAL_COMMUNITY): Payer: BC Managed Care – PPO

## 2021-09-22 ENCOUNTER — Emergency Department (HOSPITAL_COMMUNITY)
Admission: EM | Admit: 2021-09-22 | Discharge: 2021-09-22 | Disposition: A | Discharge status: Home / Self Care | Payer: BC Managed Care – PPO | Attending: Emergency Medicine | Admitting: Emergency Medicine

## 2021-09-22 ENCOUNTER — Encounter (HOSPITAL_COMMUNITY): Payer: Self-pay | Admitting: Emergency Medicine

## 2021-09-22 DIAGNOSIS — Z79899 Other long term (current) drug therapy: Secondary | ICD-10-CM | POA: Diagnosis not present

## 2021-09-22 DIAGNOSIS — J329 Chronic sinusitis, unspecified: Secondary | ICD-10-CM

## 2021-09-22 DIAGNOSIS — R519 Headache, unspecified: Secondary | ICD-10-CM | POA: Diagnosis not present

## 2021-09-22 DIAGNOSIS — Z20822 Contact with and (suspected) exposure to covid-19: Secondary | ICD-10-CM | POA: Insufficient documentation

## 2021-09-22 DIAGNOSIS — E119 Type 2 diabetes mellitus without complications: Secondary | ICD-10-CM | POA: Insufficient documentation

## 2021-09-22 DIAGNOSIS — R0602 Shortness of breath: Secondary | ICD-10-CM | POA: Insufficient documentation

## 2021-09-22 DIAGNOSIS — R0981 Nasal congestion: Secondary | ICD-10-CM | POA: Diagnosis not present

## 2021-09-22 DIAGNOSIS — Z794 Long term (current) use of insulin: Secondary | ICD-10-CM | POA: Insufficient documentation

## 2021-09-22 DIAGNOSIS — I10 Essential (primary) hypertension: Secondary | ICD-10-CM | POA: Insufficient documentation

## 2021-09-22 DIAGNOSIS — R091 Pleurisy: Secondary | ICD-10-CM | POA: Diagnosis present

## 2021-09-22 DIAGNOSIS — R051 Acute cough: Secondary | ICD-10-CM

## 2021-09-22 DIAGNOSIS — Z7984 Long term (current) use of oral hypoglycemic drugs: Secondary | ICD-10-CM | POA: Diagnosis not present

## 2021-09-22 DIAGNOSIS — Z7982 Long term (current) use of aspirin: Secondary | ICD-10-CM | POA: Diagnosis not present

## 2021-09-22 LAB — CBC WITH DIFFERENTIAL/PLATELET
Abs Immature Granulocytes: 0.01 10*3/uL (ref 0.00–0.07)
Basophils Absolute: 0 10*3/uL (ref 0.0–0.1)
Basophils Relative: 1 %
Eosinophils Absolute: 0.1 10*3/uL (ref 0.0–0.5)
Eosinophils Relative: 2 %
HCT: 44 % (ref 39.0–52.0)
Hemoglobin: 14.6 g/dL (ref 13.0–17.0)
Immature Granulocytes: 0 %
Lymphocytes Relative: 43 %
Lymphs Abs: 2 10*3/uL (ref 0.7–4.0)
MCH: 30.8 pg (ref 26.0–34.0)
MCHC: 33.2 g/dL (ref 30.0–36.0)
MCV: 92.8 fL (ref 80.0–100.0)
Monocytes Absolute: 0.4 10*3/uL (ref 0.1–1.0)
Monocytes Relative: 9 %
Neutro Abs: 2.1 10*3/uL (ref 1.7–7.7)
Neutrophils Relative %: 45 %
Platelets: 196 10*3/uL (ref 150–400)
RBC: 4.74 MIL/uL (ref 4.22–5.81)
RDW: 13.5 % (ref 11.5–15.5)
WBC: 4.6 10*3/uL (ref 4.0–10.5)
nRBC: 0 % (ref 0.0–0.2)

## 2021-09-22 LAB — BASIC METABOLIC PANEL
Anion gap: 7 (ref 5–15)
BUN: 17 mg/dL (ref 6–20)
CO2: 26 mmol/L (ref 22–32)
Calcium: 9.1 mg/dL (ref 8.9–10.3)
Chloride: 107 mmol/L (ref 98–111)
Creatinine, Ser: 1.05 mg/dL (ref 0.61–1.24)
GFR, Estimated: >60 mL/min (ref >60)
Glucose, Bld: 287 mg/dL — ABNORMAL HIGH (ref 70–99)
Potassium: 3.3 mmol/L — ABNORMAL LOW (ref 3.5–5.1)
Sodium: 140 mmol/L (ref 135–145)

## 2021-09-22 LAB — TROPONIN I (HIGH SENSITIVITY)
Troponin I (High Sensitivity): 8 ng/L (ref <18)
Troponin I (High Sensitivity): 9 ng/L (ref <18)

## 2021-09-22 LAB — RESP PANEL BY RT-PCR (FLU A&B, COVID) ARPGX2
Influenza A by PCR: NEGATIVE
Influenza B by PCR: NEGATIVE
SARS Coronavirus 2 by RT PCR: NEGATIVE

## 2021-09-22 LAB — D-DIMER, QUANTITATIVE: D-Dimer, Quant: 0.58 ug/mL-FEU — ABNORMAL HIGH (ref 0.00–0.50)

## 2021-09-22 MED ORDER — AMOXICILLIN-POT CLAVULANATE 875-125 MG PO TABS: 1.0000 | ORAL_TABLET | Freq: Two times a day (BID) | ORAL | 0 refills | Status: AC

## 2021-09-22 MED ORDER — BENZONATATE 100 MG PO CAPS: 100.0000 mg | ORAL_CAPSULE | Freq: Three times a day (TID) | ORAL | 0 refills | Status: AC

## 2021-09-22 MED ORDER — FENTANYL CITRATE PF 50 MCG/ML IJ SOSY
100.0000 ug | PREFILLED_SYRINGE | Freq: Once | INTRAMUSCULAR | Status: AC
  Administered 2021-09-22: 100 ug via INTRAVENOUS
  Filled 2021-09-22: qty 2

## 2021-09-22 MED ORDER — ALBUTEROL SULFATE HFA 108 (90 BASE) MCG/ACT IN AERS
2.0000 | INHALATION_SPRAY | RESPIRATORY_TRACT | Status: DC | PRN
  Administered 2021-09-22: 2 via RESPIRATORY_TRACT
  Filled 2021-09-22: qty 6.7

## 2021-09-22 MED ORDER — IOHEXOL 350 MG/ML SOLN
100.0000 mL | Freq: Once | INTRAVENOUS | Status: AC | PRN
  Administered 2021-09-22: 100 mL via INTRAVENOUS

## 2021-09-22 MED ORDER — OXYCODONE-ACETAMINOPHEN 5-325 MG PO TABS
1.0000 | ORAL_TABLET | Freq: Once | ORAL | Status: AC
  Administered 2021-09-22: 1 via ORAL
  Filled 2021-09-22: qty 1

## 2021-09-22 NOTE — ED Triage Notes (Signed)
Pt with c/o trouble breathing and chest tightness that started this am. Pt also c/o congestion and dry cough.

## 2021-09-22 NOTE — ED Provider Notes (Signed)
  Physical Exam  BP (!) 161/92   Pulse 64   Temp 97.7 F (36.5 C) (Oral)   Resp 15   Ht 6\' 1"  (1.854 m)   Wt 110.7 kg   SpO2 94%   BMI 32.19 kg/m   Physical Exam Vitals and nursing note reviewed.  Constitutional:      Appearance: He is well-developed.  HENT:     Head: Normocephalic and atraumatic.  Eyes:     Conjunctiva/sclera: Conjunctivae normal.  Cardiovascular:     Rate and Rhythm: Normal rate and regular rhythm.     Heart sounds: No murmur heard. Pulmonary:     Effort: Pulmonary effort is normal. No respiratory distress.     Breath sounds: Normal breath sounds.  Abdominal:     Palpations: Abdomen is soft.     Tenderness: There is no abdominal tenderness.  Musculoskeletal:     Cervical back: Neck supple.  Skin:    General: Skin is warm and dry.  Neurological:     Mental Status: He is alert.    ED Course/Procedures   Clinical Course as of 09/22/21 1519  Sun Sep 22, 2021  Sep 24, 2021 Pending CTPE, 7564 symptoms, home with augmentin and tessalon [MK]    Clinical Course User Index [MK] Glenford Peers, MD    Procedures  MDM  Patient received in handoff.  Pending CT PE at time of handoff.  Plan is for discharge with Augmentin and Tessalon Perles.  CT PE unremarkable outside of possible asymmetric small amount of edema.  Patient then discharged with Augmentin and Tessalon Perles.       Glendora Score, MD 09/22/21 [phone]

## 2021-09-22 NOTE — ED Provider Notes (Signed)
[facility] EMERGENCY DEPARTMENT Provider Note   CSN: [id number] Arrival date & time: 09/22/21  0256     History Chief Complaint  Patient presents with   Shortness of Breath    Geoffrey Jackson is a 55 y.o. male.  The history is provided by the patient.  Shortness of Breath Severity:  Moderate Onset quality:  Sudden Timing:  Constant Progression:  Unchanged Chronicity:  New Relieved by:  Nothing Worsened by:  Nothing Associated symptoms: chest pain, cough and headaches   Associated symptoms: no fever, no hemoptysis and no vomiting   Patient reports recent congestion mild headaches.  Tonight he reports he began having difficulty breathing, and left-sided chest pain is worse with breathing.  Also reports increasing congestion and dry cough.  Reports every time he leans forward his head and face hurt.  No vomiting.  No previous history of PE.    Past Medical History:  Diagnosis Date   Benign prostatic hypertrophy    Essential hypertension    GERD (gastroesophageal reflux disease)    Low back pain    Noncompliance with medications    Obesity    OSA (obstructive sleep apnea)    Non-compliant with CPAP   Type 2 diabetes mellitus [facility])     Patient Active Problem List   Diagnosis Date Noted   Dysphagia 05/08/2019   Syncope 07/30/2017   Nausea, vomiting, and diarrhea 12/17/2015   Headache 12/17/2015   Hypertensive emergency 12/17/2015   CAP (community acquired pneumonia) 06/15/2015   Elevated troponin I level 06/15/2015   Biceps tendonitis on right    Tear of biceps muscle 10/25/2013   Right shoulder pain 10/25/2013   Chest pain 04/13/2013   Diabetes mellitus type 2 in obese (HCC) 02/09/2007   Hyperlipidemia 12/08/2006   Obesity 12/08/2006   Essential hypertension 12/08/2006   GERD 12/08/2006   BENIGN PROSTATIC HYPERTROPHY 12/08/2006    Past Surgical History:  Procedure Laterality Date   BICEPT TENODESIS Right 11/10/2014   Procedure: BICEPS TENODESIS;   Surgeon: [name], MD;  Location: [facility];  Service: Orthopedics;  Laterality: Right;  biceps tendon   SHOULDER ARTHROSCOPY     SHOULDER ARTHROSCOPY  05/21/2012   Procedure: ARTHROSCOPY SHOULDER;  Surgeon: [name], MD;  Location: [facility];  Service: Orthopedics;  Laterality: Right;   SPINE SURGERY     Dr Danielle Dess    SUBACROMIAL DECOMPRESSION  05/21/2012   Procedure: SUBACROMIAL DECOMPRESSION;  Surgeon: [name], MD;  Location: [facility];  Service: Orthopedics;  Laterality: Right;       Family History  Problem Relation Age of Onset   Diabetes Father    Hypertension Father    Heart failure Father    Kidney disease Father    Diabetes Sister    Hypertension Sister    Hypertension Mother     Social History   Tobacco Use   Smoking status: Never   Smokeless tobacco: Never  Vaping Use   Vaping Use: Never used  Substance Use Topics   Alcohol use: Yes    Alcohol/week: 0.0 standard drinks    Comment: Occasionally   Drug use: No    Home Medications Prior to Admission medications   Medication Sig Start Date End Date Taking? Authorizing Provider  albuterol (PROVENTIL HFA;VENTOLIN HFA) 108 (90 BASE) MCG/ACT inhaler Inhale 2 puffs into the lungs every 4 (four) hours as needed for wheezing or shortness of breath. 06/21/15   Idol, Raynelle Fanning, PA-C  amLODipine (NORVASC) 10 MG tablet  Take 10 mg by mouth daily. 11/15/15   [provider]  aspirin 81 MG chewable tablet Chew 1 tablet (81 mg total) by mouth daily. 08/02/17   Oval Linsey, MD  cloNIDine (CATAPRES) 0.2 MG tablet Take 0.2 mg by mouth at bedtime.  04/28/18   [provider]  cyclobenzaprine (FLEXERIL) 10 MG tablet Take 10 mg by mouth 3 (three) times daily as needed for muscle spasms. 03/28/19   [provider]  dapagliflozin propanediol (FARXIGA) 10 MG TABS tablet Take 10 mg by mouth daily.    [provider]  gabapentin (NEURONTIN) 300 MG capsule Take 1 capsule (300 mg total) by  mouth daily with lunch. 06/03/19   [name], MD  HYDROcodone-acetaminophen (NORCO/VICODIN) 5-325 MG tablet Take 1-2 tablets by mouth every 4 (four) hours as needed for moderate pain. 05/10/19   Maeola Harman, MD  ibuprofen (ADVIL,MOTRIN) 800 MG tablet Take 1 tablet (800 mg total) by mouth every 8 (eight) hours as needed. Patient taking differently: Take 800 mg by mouth 3 (three) times daily as needed for headache, mild pain or moderate pain.  07/14/18   [name], MD  labetalol (NORMODYNE) 200 MG tablet Take 1 tablet (200 mg total) by mouth 2 (two) times daily. Patient taking differently: Take 100 mg by mouth 2 (two) times daily.  08/01/17   Oval Linsey, MD  LEVEMIR FLEXTOUCH 100 UNIT/ML Pen Inject 50 Units into the skin at bedtime.  09/26/18   [provider]  metFORMIN (GLUCOPHAGE) 500 MG tablet Take 500 mg by mouth 2 (two) times daily with a meal.     [provider]  nitroGLYCERIN (NITROSTAT) 0.4 MG SL tablet Place 1 tablet (0.4 mg total) under the tongue every 5 (five) minutes as needed for chest pain. 06/19/15   Oval Linsey, MD  olmesartan (BENICAR) 40 MG tablet Take 40 mg by mouth daily.    [provider]  oxyCODONE (OXY IR/ROXICODONE) 5 MG immediate release tablet Take 5 mg by mouth 4 (four) times daily as needed (for pain).     [provider]  oxyCODONE-acetaminophen (PERCOCET) 10-325 MG tablet Take 1 tablet by mouth 4 (four) times daily as needed for pain.    [provider]  potassium chloride SA (K-DUR,KLOR-CON) 20 MEQ tablet Take 1 tablet (20 mEq total) by mouth daily. 08/01/17   Oval Linsey, MD  pravastatin (PRAVACHOL) 40 MG tablet Take 40 mg by mouth daily.    [provider]  tamsulosin (FLOMAX) 0.4 MG CAPS capsule Take 0.4 mg by mouth daily.  11/15/15   [provider]    Allergies    Bystolic [nebivolol hcl], Demerol [meperidine], and Dilaudid [hydromorphone]  Review of Systems    Review of Systems  Constitutional:  Negative for fever.  Respiratory:  Positive for cough and shortness of breath. Negative for hemoptysis.   Cardiovascular:  Positive for chest pain.  Gastrointestinal:  Negative for vomiting.  Neurological:  Positive for headaches.  All other systems reviewed and are negative.  Physical Exam Updated Vital Signs BP (!) 167/105   Pulse 74   Temp 97.7 F (36.5 C) (Oral)   Resp 19   Ht 1.854 m (6\' 1" )   Wt 110.7 kg   SpO2 96%   BMI 32.19 kg/m   Physical Exam CONSTITUTIONAL: Well developed/well nourished HEAD: Normocephalic/atraumatic EYES: EOMI/PERRL ENMT: Mucous membranes moist, uvula midline without erythema or exudates NECK: supple no meningeal signs SPINE/BACK:entire spine nontender CV: S1/S2 noted, no murmurs/rubs/gallops noted  LUNGS: Lungs are clear to auscultation bilaterally, no apparent distress Chest-no bruising or crepitus, pain is not reproduced ABDOMEN: soft, nontender, no rebound or guarding, bowel sounds noted throughout abdomen GU:no cva tenderness NEURO: Pt is awake/alert/appropriate, moves all extremitiesx4.  No facial droop.   EXTREMITIES: pulses normal/equalx4, full ROM SKIN: warm, color normal PSYCH: no abnormalities of mood noted, alert and oriented to situation  ED Results / Procedures / Treatments   Labs (all labs ordered are listed, but only abnormal results are displayed) Labs Reviewed  BASIC METABOLIC PANEL - Abnormal; Notable for the following components:      Result Value   Potassium 3.3 (*)    Glucose, Bld 287 (*)    All other components within normal limits  D-DIMER, QUANTITATIVE - Abnormal; Notable for the following components:   D-Dimer, Quant 0.58 (*)    All other components within normal limits  RESP PANEL BY RT-PCR (FLU A&B, COVID) ARPGX2  CBC WITH DIFFERENTIAL/PLATELET  TROPONIN I (HIGH SENSITIVITY)  TROPONIN I (HIGH SENSITIVITY)    EKG EKG Interpretation  Date/Time:  Sunday September 22 2021 03:19:13 EDT Ventricular Rate:  84 PR Interval:  162 QRS Duration: 95 QT Interval:  415 QTC Calculation: 491 R Axis:   -56 Text Interpretation: Sinus rhythm Probable left atrial enlargement Left anterior fascicular block Borderline prolonged QT interval Confirmed by Zadie Rhine ([id number]) on 09/22/2021 3:42:56 AM  Radiology DG Chest 2 View  Result Date: 09/22/2021 CLINICAL DATA:  Dyspnea EXAM: CHEST - 2 VIEW COMPARISON:  07/30/2017 FINDINGS: Lungs are well expanded, symmetric, and clear. No pneumothorax or pleural effusion. Cardiac size within normal limits. Pulmonary vascularity is normal. Osseous structures are age-appropriate. No acute bone abnormality. IMPRESSION: No active cardiopulmonary disease. Electronically Signed   By: Helyn Numbers M.D.   On: 09/22/2021 03:43    Procedures Procedures   Medications Ordered in ED Medications  albuterol (VENTOLIN HFA) 108 (90 Base) MCG/ACT inhaler 2 puff (2 puffs Inhalation Given 09/22/21 0624)  iohexol (OMNIPAQUE) 350 MG/ML injection 100 mL (has no administration in time range)  oxyCODONE-acetaminophen (PERCOCET/ROXICET) 5-325 MG per tablet 1 tablet (1 tablet Oral Given 09/22/21 0526)  fentaNYL (SUBLIMAZE) injection 100 mcg (100 mcg Intravenous Given 09/22/21 6203)    ED Course  I have reviewed the triage vital signs and the nursing notes.  Pertinent labs & imaging results that were available during my care of the patient were reviewed by me and considered in my medical decision making (see chart for details).    MDM Rules/Calculators/A&P                           Patient reports recent congestion and cough, now having pleuritic type chest pain.  I cannot reproduce it on exam.  Initial pulse ox is 94% and his D-dimer is elevated. Will need to proceed with CT imaging to rule out acute PE.  Initial EKG and troponin are all unchanged. I have personally reviewed the chest x-ray 7:31 AM Signed out to Dr. Posey Rea at shift change to  follow-up on imaging. If CT negative, patient can be discharged with antibiotics for sinusitis as well as cough medicines Final Clinical Impression(s) / ED Diagnoses Final diagnoses:  Pleurisy  Acute cough    Rx / DC Orders ED Discharge Orders     None        Zadie Rhine, MD 09/22/21 [phone]

## 2021-12-27 ENCOUNTER — Other Ambulatory Visit (HOSPITAL_COMMUNITY): Payer: Self-pay | Admitting: Gastroenterology

## 2021-12-27 DIAGNOSIS — R1012 Left upper quadrant pain: Secondary | ICD-10-CM

## 2021-12-27 DIAGNOSIS — R109 Unspecified abdominal pain: Secondary | ICD-10-CM

## 2022-01-03 ENCOUNTER — Ambulatory Visit (HOSPITAL_COMMUNITY): Payer: BC Managed Care – PPO

## 2022-01-06 ENCOUNTER — Ambulatory Visit (HOSPITAL_COMMUNITY)
Admission: RE | Admit: 2022-01-06 | Discharge: 2022-01-06 | Disposition: A | Discharge status: Home / Self Care | Payer: BC Managed Care – PPO | Source: Ambulatory Visit | Attending: Gastroenterology | Admitting: Gastroenterology

## 2022-01-06 ENCOUNTER — Other Ambulatory Visit: Payer: Self-pay

## 2022-01-06 DIAGNOSIS — R109 Unspecified abdominal pain: Secondary | ICD-10-CM | POA: Diagnosis present

## 2022-01-22 ENCOUNTER — Emergency Department (HOSPITAL_COMMUNITY): Payer: BC Managed Care – PPO

## 2022-01-22 ENCOUNTER — Other Ambulatory Visit: Payer: Self-pay

## 2022-01-22 ENCOUNTER — Emergency Department (HOSPITAL_COMMUNITY)
Admission: EM | Admit: 2022-01-22 | Discharge: 2022-01-22 | Disposition: A | Discharge status: Home / Self Care | Payer: BC Managed Care – PPO | Attending: Emergency Medicine | Admitting: Emergency Medicine

## 2022-01-22 ENCOUNTER — Encounter (HOSPITAL_COMMUNITY): Payer: Self-pay | Admitting: Emergency Medicine

## 2022-01-22 DIAGNOSIS — Z7984 Long term (current) use of oral hypoglycemic drugs: Secondary | ICD-10-CM | POA: Insufficient documentation

## 2022-01-22 DIAGNOSIS — G43109 Migraine with aura, not intractable, without status migrainosus: Secondary | ICD-10-CM | POA: Insufficient documentation

## 2022-01-22 DIAGNOSIS — I10 Essential (primary) hypertension: Secondary | ICD-10-CM | POA: Diagnosis not present

## 2022-01-22 DIAGNOSIS — Z7982 Long term (current) use of aspirin: Secondary | ICD-10-CM | POA: Insufficient documentation

## 2022-01-22 DIAGNOSIS — E119 Type 2 diabetes mellitus without complications: Secondary | ICD-10-CM | POA: Insufficient documentation

## 2022-01-22 DIAGNOSIS — R519 Headache, unspecified: Secondary | ICD-10-CM | POA: Diagnosis present

## 2022-01-22 DIAGNOSIS — Z79899 Other long term (current) drug therapy: Secondary | ICD-10-CM | POA: Diagnosis not present

## 2022-01-22 LAB — CBC WITH DIFFERENTIAL/PLATELET
Abs Immature Granulocytes: 0.01 10*3/uL (ref 0.00–0.07)
Basophils Absolute: 0 10*3/uL (ref 0.0–0.1)
Basophils Relative: 1 %
Eosinophils Absolute: 0.1 10*3/uL (ref 0.0–0.5)
Eosinophils Relative: 1 %
HCT: 47.2 % (ref 39.0–52.0)
Hemoglobin: 14.6 g/dL (ref 13.0–17.0)
Immature Granulocytes: 0 %
Lymphocytes Relative: 37 %
Lymphs Abs: 1.5 10*3/uL (ref 0.7–4.0)
MCH: 28.8 pg (ref 26.0–34.0)
MCHC: 30.9 g/dL (ref 30.0–36.0)
MCV: 93.1 fL (ref 80.0–100.0)
Monocytes Absolute: 0.4 10*3/uL (ref 0.1–1.0)
Monocytes Relative: 10 %
Neutro Abs: 2 10*3/uL (ref 1.7–7.7)
Neutrophils Relative %: 51 %
Platelets: 198 10*3/uL (ref 150–400)
RBC: 5.07 MIL/uL (ref 4.22–5.81)
RDW: 13.9 % (ref 11.5–15.5)
WBC: 4.1 10*3/uL (ref 4.0–10.5)
nRBC: 0 % (ref 0.0–0.2)

## 2022-01-22 LAB — BASIC METABOLIC PANEL
Anion gap: 10 (ref 5–15)
BUN: 24 mg/dL — ABNORMAL HIGH (ref 6–20)
CO2: 28 mmol/L (ref 22–32)
Calcium: 9.5 mg/dL (ref 8.9–10.3)
Chloride: 103 mmol/L (ref 98–111)
Creatinine, Ser: 1.23 mg/dL (ref 0.61–1.24)
GFR, Estimated: >60 mL/min (ref >60)
Glucose, Bld: 106 mg/dL — ABNORMAL HIGH (ref 70–99)
Potassium: 3.2 mmol/L — ABNORMAL LOW (ref 3.5–5.1)
Sodium: 141 mmol/L (ref 135–145)

## 2022-01-22 MED ORDER — CLONIDINE HCL 0.2 MG PO TABS: 0.2000 mg | ORAL_TABLET | Freq: Two times a day (BID) | ORAL | 0 refills | Status: AC

## 2022-01-22 MED ORDER — LABETALOL HCL 5 MG/ML IV SOLN
10.0000 mg | Freq: Once | INTRAVENOUS | Status: AC
  Administered 2022-01-22: 10 mg via INTRAVENOUS
  Filled 2022-01-22: qty 4

## 2022-01-22 MED ORDER — HYDRALAZINE HCL 20 MG/ML IJ SOLN
10.0000 mg | Freq: Once | INTRAMUSCULAR | Status: AC
  Administered 2022-01-22: 10 mg via INTRAVENOUS
  Filled 2022-01-22: qty 1

## 2022-01-22 MED ORDER — CLONIDINE HCL 0.2 MG PO TABS
0.2000 mg | ORAL_TABLET | Freq: Once | ORAL | Status: AC
  Administered 2022-01-22: 0.2 mg via ORAL
  Filled 2022-01-22: qty 1

## 2022-01-22 MED ORDER — PROCHLORPERAZINE EDISYLATE 10 MG/2ML IJ SOLN
10.0000 mg | Freq: Once | INTRAMUSCULAR | Status: AC
  Administered 2022-01-22: 10 mg via INTRAVENOUS
  Filled 2022-01-22: qty 2

## 2022-01-22 MED ORDER — DEXAMETHASONE SODIUM PHOSPHATE 10 MG/ML IJ SOLN
10.0000 mg | Freq: Once | INTRAMUSCULAR | Status: AC
  Administered 2022-01-22: 10 mg via INTRAVENOUS
  Filled 2022-01-22: qty 1

## 2022-01-22 MED ORDER — DIPHENHYDRAMINE HCL 50 MG/ML IJ SOLN
12.5000 mg | Freq: Once | INTRAMUSCULAR | Status: AC
  Administered 2022-01-22: 12.5 mg via INTRAVENOUS
  Filled 2022-01-22: qty 1

## 2022-01-22 MED ORDER — POTASSIUM CHLORIDE CRYS ER 20 MEQ PO TBCR
20.0000 meq | EXTENDED_RELEASE_TABLET | Freq: Once | ORAL | Status: AC
Start: 2022-01-22 — End: 2022-01-22
  Administered 2022-01-22: 20 meq via ORAL
  Filled 2022-01-22: qty 1

## 2022-01-22 MED ORDER — AMLODIPINE BESYLATE 5 MG PO TABS
10.0000 mg | ORAL_TABLET | Freq: Once | ORAL | Status: AC
  Administered 2022-01-22: 10 mg via ORAL
  Filled 2022-01-22: qty 2

## 2022-01-22 MED ORDER — ACETAMINOPHEN 500 MG PO TABS
1000.0000 mg | ORAL_TABLET | Freq: Once | ORAL | Status: AC
Start: 2022-01-22 — End: 2022-01-22
  Administered 2022-01-22: 1000 mg via ORAL
  Filled 2022-01-22: qty 2

## 2022-01-22 NOTE — ED Provider Notes (Signed)
[facility] EMERGENCY DEPARTMENT Provider Note   CSN: [id number] Arrival date & time: 01/22/22  0847     History  Chief Complaint  Patient presents with   Hypertension    Geoffrey Jackson is a 56 y.o. male with a history significant for hypertension, type 2 diabetes, GERD, recent diagnosis of stomach ulcer per endoscopy and history of migraine headaches presenting for evaluation of increased blood pressure despite change in his blood pressure medications.  He states he has had a longstanding history of uncontrolled blood pressures, at least partially secondary to noncompliance by his report, however has been compliant in recent weeks.  He was seen by his PCP on Friday and had a change from lisinopril to losartan which he started taking on Monday.  Since then he has had persistently elevated blood pressures, maximum pressure recorded was 185/111.  Additionally he reports frontal headache along with photophobia and has developed several episodes of nausea and vomiting since yesterday.  The headache symptoms are consistent with his prior migraine headache symptoms.  He denies fevers or chills, chest pain or shortness of breath, he denies diarrhea, abdominal pain, peripheral edema.  He has had no medication prior to arrival for treatment of his headache.  The history is provided by the patient.      Home Medications Prior to Admission medications   Medication Sig Start Date End Date Taking? Authorizing Provider  albuterol (PROVENTIL HFA;VENTOLIN HFA) 108 (90 BASE) MCG/ACT inhaler Inhale 2 puffs into the lungs every 4 (four) hours as needed for wheezing or shortness of breath. 06/21/15  Yes Aayush Gelpi, Raynelle Fanning, PA-C  amLODipine (NORVASC) 10 MG tablet Take 10 mg by mouth daily. 11/15/15  Yes [provider]  aspirin 81 MG chewable tablet Chew 1 tablet (81 mg total) by mouth daily. 08/02/17  Yes Oval Linsey, MD  cyclobenzaprine (FLEXERIL) 10 MG tablet Take 10 mg by mouth 3 (three) times  daily as needed for muscle spasms. 03/28/19  Yes [provider]  dapagliflozin propanediol (FARXIGA) 10 MG TABS tablet Take 10 mg by mouth daily.   Yes [provider]  gabapentin (NEURONTIN) 300 MG capsule Take 1 capsule (300 mg total) by mouth daily with lunch. 06/03/19  Yes [name], MD  labetalol (NORMODYNE) 200 MG tablet Take 1 tablet (200 mg total) by mouth 2 (two) times daily. 08/01/17  Yes Dondiego, Richard, MD  LEVEMIR FLEXTOUCH 100 UNIT/ML Pen Inject 50 Units into the skin at bedtime.  09/26/18  Yes [provider]  losartan-hydrochlorothiazide (HYZAAR) 100-25 MG tablet Take 1 tablet by mouth daily. 01/17/22  Yes [provider]  metFORMIN (GLUCOPHAGE) 500 MG tablet Take 1,000 mg by mouth 2 (two) times daily with a meal.   Yes [provider]  mupirocin ointment (BACTROBAN) 2 % Apply 1 application topically 2 (two) times daily.   Yes [provider]  naloxone (NARCAN) nasal spray 4 mg/0.1 mL Place 1 spray into the nose once.   Yes [provider]  nitroGLYCERIN (NITROSTAT) 0.4 MG SL tablet Place 1 tablet (0.4 mg total) under the tongue every 5 (five) minutes as needed for chest pain. 06/19/15  Yes Oval Linsey, MD  oxyCODONE-acetaminophen (PERCOCET) 10-325 MG tablet Take 1 tablet by mouth 4 (four) times daily as needed for pain.   Yes [provider]  OZEMPIC, 0.25 OR 0.5 MG/DOSE, 2 MG/1.5ML SOPN Inject 0.25 mg into the skin once a week. 01/06/22  Yes [provider]  pantoprazole (PROTONIX) 40 MG tablet Take  40 mg by mouth daily. 11/05/21  Yes [provider]  potassium chloride SA (K-DUR,KLOR-CON) 20 MEQ tablet Take 1 tablet (20 mEq total) by mouth daily. 08/01/17  Yes Dondiego, Gerlene Burdockichard, MD  pravastatin (PRAVACHOL) 40 MG tablet Take 40 mg by mouth daily.   Yes [provider]  tamsulosin (FLOMAX) 0.4 MG CAPS capsule Take 0.4 mg by mouth daily.  11/15/15  Yes [provider]   VITAMIN D PO Take 1 tablet by mouth daily.   Yes [provider]  amoxicillin-clavulanate (AUGMENTIN) 875-125 MG tablet Take 1 tablet by mouth every 12 (twelve) hours. Patient not taking: Reported on 01/22/2022 09/22/21   Kommor, Wyn ForsterMadison, MD  benzonatate (TESSALON) 100 MG capsule Take 1 capsule (100 mg total) by mouth every 8 (eight) hours. Patient not taking: Reported on 01/22/2022 09/22/21   Kommor, Wyn ForsterMadison, MD  cloNIDine (CATAPRES) 0.2 MG tablet Take 1 tablet (0.2 mg total) by mouth 2 (two) times daily. 01/22/22   Burgess AmorIdol, [place] Kestner, PA-C  HYDROcodone-acetaminophen (NORCO/VICODIN) 5-325 MG tablet Take 1-2 tablets by mouth every 4 (four) hours as needed for moderate pain. Patient not taking: Reported on 01/22/2022 05/10/19   Maeola HarmanStern, Joseph, MD  ibuprofen (ADVIL,MOTRIN) 800 MG tablet Take 1 tablet (800 mg total) by mouth every 8 (eight) hours as needed. Patient not taking: Reported on 01/22/2022 07/14/18   Vickki HearingHarrison, Stanley E, MD  olmesartan (BENICAR) 40 MG tablet Take 40 mg by mouth daily.    [provider]      Allergies    Bystolic [nebivolol hcl], Demerol [meperidine], and Dilaudid [hydromorphone]    Review of Systems   Review of Systems  Constitutional:  Negative for chills and fever.  HENT:  Negative for congestion and sore throat.   Eyes:  Positive for photophobia.  Respiratory:  Negative for chest tightness and shortness of breath.   Cardiovascular:  Negative for chest pain.  Gastrointestinal:  Negative for abdominal pain and nausea.  Genitourinary: Negative.   Musculoskeletal:  Negative for arthralgias, joint swelling and neck pain.  Skin: Negative.  Negative for rash and wound.  Neurological:  Positive for headaches. Negative for dizziness, weakness, light-headedness and numbness.  Psychiatric/Behavioral: Negative.    All other systems reviewed and are negative.  Physical Exam Updated Vital Signs BP (!) 139/95    Pulse 85    Temp 97.6 F (36.4 C) (Oral)    Resp 16    Ht  6\' 1"  (1.854 m)    Wt 110.7 kg    SpO2 99%    BMI 32.20 kg/m  Physical Exam Vitals and nursing note reviewed.  Constitutional:      Appearance: He is well-developed.     Comments: Uncomfortable appearing.  Lying in darkened room.  HENT:     Head: Normocephalic and atraumatic.     Right Ear: Tympanic membrane normal.     Left Ear: Tympanic membrane normal.  Eyes:     Extraocular Movements: Extraocular movements intact.     Conjunctiva/sclera: Conjunctivae normal.     Pupils: Pupils are equal, round, and reactive to light.  Cardiovascular:     Rate and Rhythm: Normal rate and regular rhythm.     Heart sounds: Normal heart sounds.  Pulmonary:     Effort: Pulmonary effort is normal.     Breath sounds: Normal breath sounds. No wheezing.  Abdominal:     General: Bowel sounds are normal.     Palpations: Abdomen is soft.     Tenderness: There is no abdominal  tenderness.  Musculoskeletal:        General: Normal range of motion.     Cervical back: Normal range of motion and neck supple.  Lymphadenopathy:     Cervical: No cervical adenopathy.  Skin:    General: Skin is warm and dry.     Findings: No rash.  Neurological:     General: No focal deficit present.     Mental Status: He is alert and oriented to person, place, and time.     GCS: GCS eye subscore is 4. GCS verbal subscore is 5. GCS motor subscore is 6.     Cranial Nerves: No cranial nerve deficit.     Sensory: No sensory deficit.     Coordination: Coordination normal.     Gait: Gait normal.     Deep Tendon Reflexes: Reflexes normal.     Comments: Normal heel-shin, normal rapid alternating movements. Cranial nerves III-XII intact.  No pronator drift.  Psychiatric:        Speech: Speech normal.        Behavior: Behavior normal.        Thought Content: Thought content normal.    ED Results / Procedures / Treatments   Labs (all labs ordered are listed, but only abnormal results are displayed) Labs Reviewed  BASIC  METABOLIC PANEL - Abnormal; Notable for the following components:      Result Value   Potassium 3.2 (*)    Glucose, Bld 106 (*)    BUN 24 (*)    All other components within normal limits  CBC WITH DIFFERENTIAL/PLATELET    EKG EKG Interpretation  Date/Time:  Wednesday January 22 2022 10:16:00 EST Ventricular Rate:  66 PR Interval:  152 QRS Duration: 102 QT Interval:  433 QTC Calculation: 454 R Axis:   0 Text Interpretation: Sinus rhythm Markedly posterior QRS axis Borderline repolarization abnormality Since last tracing rate slower Confirmed by Eber Hong ([id number]) on 01/22/2022 12:45:01 PM  Radiology CT Head Wo Contrast  Result Date: 01/22/2022 CLINICAL DATA:  Headache, new or worsening (Age >= 50y) EXAM: CT HEAD WITHOUT CONTRAST TECHNIQUE: Contiguous axial images were obtained from the base of the skull through the vertex without intravenous contrast. RADIATION DOSE REDUCTION: This exam was performed according to the departmental dose-optimization program which includes automated exposure control, adjustment of the mA and/or kV according to patient size and/or use of iterative reconstruction technique. COMPARISON:  September 2022 FINDINGS: Brain: There is no acute intracranial hemorrhage, mass effect, or edema. Gray-white differentiation is preserved. There is no extra-axial fluid collection. Ventricles and sulci are within normal limits in size and configuration. 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 Vascular: There is atherosclerotic calcification at the skull base. Skull: Calvarium is unremarkable. Sinuses/Orbits: No acute finding. Other: None. IMPRESSION: No acute intracranial abnormality. Electronically Signed   By: Guadlupe Spanish M.D.   On: 01/22/2022 11:44   DG Chest Port 1 View  Result Date: 01/22/2022 CLINICAL DATA:  High blood pressure, headache EXAM: PORTABLE CHEST 1 VIEW COMPARISON:  Chest radiograph 09/22/2021 FINDINGS: The cardiomediastinal silhouette is normal  There is no focal consolidation or pulmonary edema. There is no pleural effusion or pneumothorax There is no acute osseous abnormality. IMPRESSION: No radiographic evidence of acute cardiopulmonary process. Electronically Signed   By: Lesia Hausen M.D.   On: 01/22/2022 13:13    Procedures Procedures    Medications Ordered in ED Medications  prochlorperazine (COMPAZINE) injection 10 mg (10 mg Intravenous Given 01/22/22 1022)  hydrALAZINE (  APRESOLINE) injection 10 mg (10 mg Intravenous Given 01/22/22 1021)  dexamethasone (DECADRON) injection 10 mg (10 mg Intravenous Given 01/22/22 1021)  diphenhydrAMINE (BENADRYL) injection 12.5 mg (12.5 mg Intravenous Given 01/22/22 1021)  labetalol (NORMODYNE) injection 10 mg (10 mg Intravenous Given 01/22/22 1118)  amLODipine (NORVASC) tablet 10 mg (10 mg Oral Given 01/22/22 1118)  potassium chloride SA (KLOR-CON M) CR tablet 20 mEq (20 mEq Oral Given 01/22/22 1118)  cloNIDine (CATAPRES) tablet 0.2 mg (0.2 mg Oral Given 01/22/22 1251)  acetaminophen (TYLENOL) tablet 1,000 mg (1,000 mg Oral Given 01/22/22 1259)    ED Course/ Medical Decision Making/ A&P Clinical Course as of 01/23/22 0914  Wed Jan 22, 2022  1110 Pt re-evaluated after receiving migraine cocktail and hydralazine. Still has headache although improving,  blood pressure higher at 200/107.  Added labetalol IV (has not taken his this am), also norvasc po.  Ct head ordered given persistent headache.  [JI]    Clinical Course User Index [JI] Victoriano Lain                           Medical Decision Making Pt presenting with headache with photophobia n/v suggesting his normal presentation of migraine headache in association with chronically uncontrolled blood pressures.  Differential diagnosis includes typical migraine independent of elevated bp's, hypertensive crisis, head bleed/cva.    Amount and/or Complexity of Data Reviewed External Data Reviewed: notes.    Details: outside records reviewed including  recent medication changes.  It is noted that pt is prescribed clonidine for bid use, only taking qhs. Labs: ordered.    Details: labs reviewed, reassuring, no sign of end organ damage, normal creatinine,  mild hypokalemia at 3.2 Radiology: ordered.    Details: cxr wtih no evidence for cardiomegaly or fluid overload.  CT head performed after headache did not respond to migraine cocktail, negative for acute bleed. ECG/medicine tests: ordered.    Details: no acute changes Discussion of management or test interpretation with external provider(s): Pt was given multiple blood pressure medications here, after receiving last dose (clonidine) blood pressure responded nicely. He reports headache has resolved at time dc but still has some mild photophobia,  suspect headache sx was migraine induced.  We discussed bp medications at length including the need to take his clonidine bid as prescribed.  Also given form for keeping track of his bps this next week on the increased clonidine dosing and planned recheck with pcp within 1 week.  Pt understands plan.  Stable for dc home.    Risk OTC drugs. Prescription drug management. Decision regarding hospitalization. Risk Details: Pt stable for dc home, no indication for admission.           Final Clinical Impression(s) / ED Diagnoses Final diagnoses:  Primary hypertension  Migraine with aura and without status migrainosus, not intractable    Rx / DC Orders ED Discharge Orders          Ordered    cloNIDine (CATAPRES) 0.2 MG tablet  2 times daily        01/22/22 1422              Burgess Amor, PA-C 01/23/22 [id number]    Eber Hong, MD 01/24/22 [phone]

## 2022-01-22 NOTE — ED Triage Notes (Addendum)
Pt to the ED with complaints of high blood pressure with a HA for the past several days. ? ?The pt was placed on an addition BP medication and has taken two doses with no resolution. ? ?Pt has not taken any medication today. ? ?

## 2022-01-22 NOTE — Discharge Instructions (Signed)
Increase your clonidine taking 1 tablet morning and evening - this missing morning dose can be the reason your pressures have been running so high.  Keep track of your blood pressures and record them on the attached form.  Plan follow up with your MD for an office recheck within the next 1-2 week.   ? ?

## 2022-02-07 ENCOUNTER — Ambulatory Visit (HOSPITAL_COMMUNITY): Payer: BC Managed Care – PPO

## 2022-03-18 ENCOUNTER — Ambulatory Visit (HOSPITAL_COMMUNITY): Payer: BC Managed Care – PPO

## 2023-01-30 ENCOUNTER — Encounter (HOSPITAL_COMMUNITY): Payer: Self-pay | Admitting: Emergency Medicine

## 2023-01-30 ENCOUNTER — Emergency Department (HOSPITAL_COMMUNITY)
Admission: EM | Admit: 2023-01-30 | Discharge: 2023-01-30 | Disposition: A | Discharge status: Home / Self Care | Payer: BC Managed Care – PPO | Attending: Emergency Medicine | Admitting: Emergency Medicine

## 2023-01-30 ENCOUNTER — Other Ambulatory Visit: Payer: Self-pay

## 2023-01-30 DIAGNOSIS — I10 Essential (primary) hypertension: Secondary | ICD-10-CM | POA: Insufficient documentation

## 2023-01-30 DIAGNOSIS — M546 Pain in thoracic spine: Secondary | ICD-10-CM | POA: Insufficient documentation

## 2023-01-30 DIAGNOSIS — E119 Type 2 diabetes mellitus without complications: Secondary | ICD-10-CM | POA: Insufficient documentation

## 2023-01-30 DIAGNOSIS — Y92410 Unspecified street and highway as the place of occurrence of the external cause: Secondary | ICD-10-CM | POA: Diagnosis not present

## 2023-01-30 MED ORDER — METHOCARBAMOL 500 MG PO TABS: 500.0000 mg | ORAL_TABLET | Freq: Three times a day (TID) | ORAL | 0 refills | Status: AC | PRN

## 2023-01-30 MED ORDER — NAPROXEN 250 MG PO TABS
500.0000 mg | ORAL_TABLET | Freq: Once | ORAL | Status: AC
  Administered 2023-01-30: 500 mg via ORAL
  Filled 2023-01-30: qty 2

## 2023-01-30 MED ORDER — NAPROXEN 500 MG PO TABS: 500.0000 mg | ORAL_TABLET | Freq: Two times a day (BID) | ORAL | 0 refills | Status: AC
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
## 2023-09-17 ENCOUNTER — Encounter (INDEPENDENT_AMBULATORY_CARE_PROVIDER_SITE_OTHER): Payer: Self-pay | Admitting: Otolaryngology

## 2024-11-10 NOTE — Progress Notes (Signed)
Drug screen collected

## 2024-12-02 ENCOUNTER — Encounter (HOSPITAL_COMMUNITY): Payer: Self-pay | Admitting: Emergency Medicine

## 2024-12-02 ENCOUNTER — Other Ambulatory Visit: Payer: Self-pay

## 2024-12-02 ENCOUNTER — Emergency Department (HOSPITAL_COMMUNITY)

## 2024-12-02 ENCOUNTER — Emergency Department (HOSPITAL_COMMUNITY)
Admission: EM | Admit: 2024-12-02 | Discharge: 2024-12-02 | Disposition: A | Discharge status: Home / Self Care | Attending: Emergency Medicine | Admitting: Emergency Medicine

## 2024-12-02 DIAGNOSIS — Z7984 Long term (current) use of oral hypoglycemic drugs: Secondary | ICD-10-CM | POA: Diagnosis not present

## 2024-12-02 DIAGNOSIS — Z794 Long term (current) use of insulin: Secondary | ICD-10-CM | POA: Diagnosis not present

## 2024-12-02 DIAGNOSIS — R202 Paresthesia of skin: Secondary | ICD-10-CM | POA: Insufficient documentation

## 2024-12-02 DIAGNOSIS — Z79899 Other long term (current) drug therapy: Secondary | ICD-10-CM | POA: Insufficient documentation

## 2024-12-02 DIAGNOSIS — Z7982 Long term (current) use of aspirin: Secondary | ICD-10-CM | POA: Insufficient documentation

## 2024-12-02 DIAGNOSIS — R519 Headache, unspecified: Secondary | ICD-10-CM | POA: Insufficient documentation

## 2024-12-02 DIAGNOSIS — I10 Essential (primary) hypertension: Secondary | ICD-10-CM | POA: Diagnosis not present

## 2024-12-02 DIAGNOSIS — R200 Anesthesia of skin: Secondary | ICD-10-CM | POA: Diagnosis not present

## 2024-12-02 DIAGNOSIS — E119 Type 2 diabetes mellitus without complications: Secondary | ICD-10-CM | POA: Diagnosis not present

## 2024-12-02 DIAGNOSIS — H538 Other visual disturbances: Secondary | ICD-10-CM | POA: Diagnosis not present

## 2024-12-02 DIAGNOSIS — I639 Cerebral infarction, unspecified: Secondary | ICD-10-CM

## 2024-12-02 LAB — CBC
HCT: 45 % (ref 39.0–52.0)
Hemoglobin: 14.7 g/dL (ref 13.0–17.0)
MCH: 30.6 pg (ref 26.0–34.0)
MCHC: 32.7 g/dL (ref 30.0–36.0)
MCV: 93.8 fL (ref 80.0–100.0)
Platelets: 234 K/uL (ref 150–400)
RBC: 4.8 MIL/uL (ref 4.22–5.81)
RDW: 13.3 % (ref 11.5–15.5)
WBC: 4.1 K/uL (ref 4.0–10.5)
nRBC: 0 % (ref 0.0–0.2)

## 2024-12-02 LAB — I-STAT CHEM 8, ED
BUN: 26 mg/dL — ABNORMAL HIGH (ref 6–20)
Calcium, Ion: 1.2 mmol/L (ref 1.15–1.40)
Chloride: 104 mmol/L (ref 98–111)
Creatinine, Ser: 1.4 mg/dL — ABNORMAL HIGH (ref 0.61–1.24)
Glucose, Bld: 159 mg/dL — ABNORMAL HIGH (ref 70–99)
HCT: 46 % (ref 39.0–52.0)
Hemoglobin: 15.6 g/dL (ref 13.0–17.0)
Potassium: 4 mmol/L (ref 3.5–5.1)
Sodium: 143 mmol/L (ref 135–145)
TCO2: 26 mmol/L (ref 22–32)

## 2024-12-02 LAB — BASIC METABOLIC PANEL WITH GFR
Anion gap: 14 (ref 5–15)
BUN: 23 mg/dL — ABNORMAL HIGH (ref 6–20)
CO2: 24 mmol/L (ref 22–32)
Calcium: 9.7 mg/dL (ref 8.9–10.3)
Chloride: 105 mmol/L (ref 98–111)
Creatinine, Ser: 1.35 mg/dL — ABNORMAL HIGH (ref 0.61–1.24)
GFR, Estimated: >60 mL/min
Glucose, Bld: 156 mg/dL — ABNORMAL HIGH (ref 70–99)
Potassium: 4 mmol/L (ref 3.5–5.1)
Sodium: 143 mmol/L (ref 135–145)

## 2024-12-02 LAB — CBG MONITORING, ED: Glucose-Capillary: 161 mg/dL — ABNORMAL HIGH (ref 70–99)

## 2024-12-02 MED ORDER — KETOROLAC TROMETHAMINE 15 MG/ML IJ SOLN
15.0000 mg | Freq: Once | INTRAMUSCULAR | Status: AC
  Administered 2024-12-02: 15 mg via INTRAVENOUS
  Filled 2024-12-02: qty 1

## 2024-12-02 MED ORDER — PROCHLORPERAZINE EDISYLATE 10 MG/2ML IJ SOLN
5.0000 mg | Freq: Once | INTRAMUSCULAR | Status: AC
  Administered 2024-12-02: 5 mg via INTRAVENOUS
  Filled 2024-12-02: qty 2

## 2024-12-02 MED ORDER — IOHEXOL 350 MG/ML SOLN
75.0000 mL | Freq: Once | INTRAVENOUS | Status: AC | PRN
  Administered 2024-12-02: 75 mL via INTRAVENOUS

## 2024-12-02 NOTE — ED Triage Notes (Signed)
 Pt states he woke up @ 0500 this morning and was normal then went back to sleep. Woke back up at 0530 with left sided ear pain, a headache and left arm tingling and numbness. Dr.Pickering at bedside.

## 2024-12-02 NOTE — ED Notes (Signed)
 Patient transported to MRI

## 2024-12-02 NOTE — ED Provider Notes (Signed)
 " [facility] EMERG[facility]Y DEPARTMENT AT [facility] Provider Note   CSN: [id number] Arrival date & time: 12/02/24  9166     Patient presents with: Headache   Geoffrey Jackson is a 59 y.o. male.    Headache Patient woke up and then developed a severe headache at around 530 this morning.  Left-sided headache feels like a fullness.  Also tingling of left arm and leg.  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  Has had a history of migraines but never had numbness or weakness with it.  No confusion.  Does has history of hypertension.  Also states has trouble hearing out of the left ear.  States vision is also gotten a little blurry in both eyes.    Past Medical History:  Diagnosis Date   Benign prostatic hypertrophy    Essential hypertension    GERD (gastroesophageal reflux disease)    Low back pain    Noncompliance with medications    Obesity    OSA (obstructive sleep apnea)    Non-compliant with CPAP   Type 2 diabetes mellitus (HCC)     Prior to Admission medications  Medication Sig Start Date End Date Taking? Authorizing Provider  albuterol  (PROVENTIL  HFA;VENTOLIN  HFA) 108 (90 BASE) MCG/ACT inhaler Inhale 2 puffs into the lungs every 4 (four) hours as needed for wheezing or shortness of breath. 06/21/15   Idol, Mliss, PA-C  amLODipine  (NORVASC ) 10 MG tablet Take 10 mg by mouth daily. 11/15/15   [provider]  amoxicillin -clavulanate (AUGMENTIN ) 875-125 MG tablet Take 1 tablet by mouth every 12 (twelve) hours. Patient not taking: Reported on 01/22/2022 09/22/21   Kommor, Lum, MD  aspirin  81 MG chewable tablet Chew 1 tablet (81 mg total) by mouth daily. 08/02/17   Rennis Ade, MD  benzonatate  (TESSALON ) 100 MG capsule Take 1 capsule (100 mg total) by mouth every 8 (eight) hours. Patient not taking: Reported on 01/22/2022 09/22/21   Kommor, Lum, MD  cloNIDine  (CATAPRES ) 0.2 MG tablet Take 1 tablet (0.2 mg total) by  mouth 2 (two) times daily. 01/22/22   Idol, Julie, PA-C  dapagliflozin propanediol (FARXIGA) 10 MG TABS tablet Take 10 mg by mouth daily.    [provider]  gabapentin  (NEURONTIN ) 300 MG capsule Take 1 capsule (300 mg total) by mouth daily with lunch. 06/03/19   Margrette Taft BRAVO, MD  HYDROcodone -acetaminophen  (NORCO/VICODIN) 5-325 MG tablet Take 1-2 tablets by mouth every 4 (four) hours as needed for moderate pain. Patient not taking: Reported on 01/22/2022 05/10/19   Unice Pac, MD  ibuprofen  (ADVIL ,MOTRIN ) 800 MG tablet Take 1 tablet (800 mg total) by mouth every 8 (eight) hours as needed. Patient not taking: Reported on 01/22/2022 07/14/18   Margrette Taft BRAVO, MD  labetalol  (NORMODYNE ) 200 MG tablet Take 1 tablet (200 mg total) by mouth 2 (two) times daily. 08/01/17   Rennis Ade, MD  LEVEMIR FLEXTOUCH 100 UNIT/ML Pen Inject 50 Units into the skin at bedtime.  09/26/18   [provider]  losartan-hydrochlorothiazide  (HYZAAR) 100-25 MG tablet Take 1 tablet by mouth daily. 01/17/22   [provider]  metFORMIN  (GLUCOPHAGE ) 500 MG tablet Take 1,000 mg by mouth 2 (two) times daily with a meal.    [provider]  methocarbamol  (ROBAXIN ) 500 MG tablet Take 1 tablet (500 mg total) by mouth every 8 (eight) hours as needed for muscle spasms. 01/30/23   Theadore Ozell HERO, MD  mupirocin ointment (  BACTROBAN) 2 % Apply 1 application topically 2 (two) times daily.    [provider]  naloxone [facility]) nasal spray 4 mg/0.1 mL Place 1 spray into the nose once.    [provider]  naproxen  (NAPROSYN ) 500 MG tablet Take 1 tablet (500 mg total) by mouth 2 (two) times daily. 01/30/23   Theadore Ozell HERO, MD  nitroGLYCERIN  (NITROSTAT ) 0.4 MG SL tablet Place 1 tablet (0.4 mg total) under the tongue every 5 (five) minutes as needed for chest pain. 06/19/15   Rennis Ade, MD  olmesartan  (BENICAR ) 40 MG tablet Take 40 mg by mouth daily.    [provider]   oxyCODONE -acetaminophen  (PERCOCET) 10-325 MG tablet Take 1 tablet by mouth 4 (four) times daily as needed for pain.    [provider]  OZEMPIC, 0.25 OR 0.5 MG/DOSE, 2 MG/1.5ML SOPN Inject 0.25 mg into the skin once a week. 01/06/22   [provider]  pantoprazole (PROTONIX) 40 MG tablet Take 40 mg by mouth daily. 11/05/21   [provider]  potassium chloride  SA (K-DUR,KLOR-CON ) 20 MEQ tablet Take 1 tablet (20 mEq total) by mouth daily. 08/01/17   Rennis Ade, MD  pravastatin  (PRAVACHOL ) 40 MG tablet Take 40 mg by mouth daily.    [provider]  tamsulosin  (FLOMAX ) 0.4 MG CAPS capsule Take 0.4 mg by mouth daily.  11/15/15   [provider]  VITAMIN D  PO Take 1 tablet by mouth daily.    [provider]    Allergies: Bystolic  [nebivolol  hcl], Demerol [meperidine], and Dilaudid  [hydromorphone ]    Review of Systems  Neurological:  Positive for headaches.    Updated Vital Signs BP (!) 158/88 (BP Location: Right Arm)   Pulse 60   Resp 18   Ht 6' 1 (1.854 m)   Wt 105 kg   SpO2 100%   BMI 30.54 kg/m   Physical Exam Vitals and nursing note reviewed.  Cardiovascular:     Rate and Rhythm: Regular rhythm.  Neurological:     Mental Status: He is alert.     Comments: Face symmetric.  Paresthesias to left upper extremity and left lower extremity.  However sensation grossly intact.  Face symmetric.  No visual field cut.  No neglect.  Strength intact bilateral upper and lower extremities.     (all labs ordered are listed, but only abnormal results are displayed) Labs Reviewed  BASIC METABOLIC PANEL WITH GFR - Abnormal; Notable for the following components:      Result Value   Glucose, Bld 156 (*)    BUN 23 (*)    Creatinine, Ser 1.35 (*)    All other components within normal limits  CBG MONITORING, ED - Abnormal; Notable for the following components:   Glucose-Capillary 161 (*)    All other components within normal limits   I-STAT CHEM 8, ED - Abnormal; Notable for the following components:   BUN 26 (*)    Creatinine, Ser 1.40 (*)    Glucose, Bld 159 (*)    All other components within normal limits  CBC    EKG: EKG Interpretation Date/Time:  Friday December 02 2024 08:50:39 EST Ventricular Rate:  68 PR Interval:  163 QRS Duration:  101 QT Interval:  440 QTC Calculation: 468 R Axis:   -21  Text Interpretation: Sinus rhythm Borderline left axis deviation Abnormal R-wave progression, late transition Borderline repolarization abnormality No significant change since last tracing Confirmed by Patsey Lot [id number]) on 12/02/2024 8:53:47 AM  Radiology:  MR BRAIN WO CONTRAST Result Date: 12/02/2024 EXAM: MRI BRAIN WITHOUT CONTRAST 12/02/2024 10:02:32 AM TECHNIQUE: Multiplanar multisequence MRI of the head/brain was performed without the administration of intravenous contrast. COMPARISON: Same day CTA head and neck and MRI head 04/11/2005. CLINICAL HISTORY: Neuro deficit, acute, stroke suspected. FINDINGS: BRAIN AND VENTRICLES: No acute infarct. No acute intracranial hemorrhage. Associated areas of susceptibility suggestive of remote hemorrhage. T2 and FLAIR hyperintensity in the periventricular and subcortical white matter compatible with mild chronic microvascular ischemic changes. There are multiple remote lacunar infarcts bilaterally in the centrum semiovale and in the left greater than right corona radiata. Few remote lacunar infarcts in the bilateral periatrial white matter. Remote infarct in the left frontal periventricular white matter partially involving the genu of the corpus callosum. 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 This finding is similar in appearance to the 2006 MRI although based on the provided images it may be 2 mm or so enlarged since that time. MRI of the pituitary/sella can be considered for  further evaluation if clinically indicated. No mass. No midline shift. No hydrocephalus. Normal flow voids. ORBITS: No significant abnormality. SINUSES AND MASTOIDS: Mucosal thickening in the maxillary sinuses. BONES AND SOFT TISSUES: Normal marrow signal. No significant soft tissue abnormality. IMPRESSION: 1. No acute findings. 2. Mild chronic microvascular ischemic changes. Multiple remote lacunar infarcts as above. 3. Remote hemorrhagic infarct in the left frontal periventricular white matter partially involving the genu of the corpus callosum. 4. 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 MRI of the pituitary/sella can be considered for further evaluation if clinically indicated. Electronically signed by: Donnice Mania MD MD 12/02/2024 10:36 AM EST RP Workstation: [id number]   CT ANGIO HEAD NECK W WO CM Result Date: 12/02/2024 CLINICAL DATA:  Left arm tingling and numbness EXAM: CT ANGIOGRAPHY HEAD AND NECK WITH AND WITHOUT CONTRAST TECHNIQUE: Multidetector CT imaging of the head and neck was performed using the standard protocol during bolus administration of intravenous contrast. Multiplanar CT image reconstructions and MIPs were obtained to evaluate the vascular anatomy. Carotid stenosis measurements (when applicable) are obtained utilizing NASCET criteria, using the distal internal carotid diameter as the denominator. RADIATION DOSE REDUCTION: This exam was performed according to the departmental dose-optimization program which includes automated exposure control, adjustment of the mA and/or kV according to patient size and/or use of iterative reconstruction technique. CONTRAST:  75mL OMNIPAQUE  IOHEXOL  350 MG/ML SOLN COMPARISON:  None Available. FINDINGS: CT HEAD: Attenuation in the brain parenchyma is normal There is no hemorrhage. No acute ischemic changes. No mass  lesion. The ventricles are normal. Skull/sinuses/orbits: No significant abnormality. CTA NECK: CTA NECK Aortic arch: No proximal vessel stenosis. Right carotid: Normal Left carotid: Normal Right vertebral: Normal Left vertebral: Normal Soft tissues: No significant abnormality Other comments: None CTA HEAD: CTA HEAD Right anterior circulation: The internal carotid artery is patent without significant stenosis. The anterior and middle cerebral arteries are patent without significant stenosis or proximal branch occlusion. No aneurysm. Left anterior circulation: The internal carotid artery is patent without significant stenosis. The anterior and middle cerebral arteries are patent without significant stenosis or proximal branch occlusion. No aneurysm. Posterior circulation: Both vertebral arteries are patent. There is no significant basilar stenosis. Both posterior cerebral arteries are patent without significant stenosis or proximal branch occlusion.  No aneurysm. IMPRESSION: No extracranial carotid artery stenosis No significant vertebrobasilar or intracranial disease. Normal noncontrast head CT Electronically Signed   By: Nancyann Burns M.D.   On: 12/02/2024 09:15     Procedures   Medications Ordered in the ED  prochlorperazine  (COMPAZINE ) injection 5 mg (5 mg Intravenous Given 12/02/24 0915)  iohexol  (OMNIPAQUE ) 350 MG/ML injection 75 mL (75 mLs Intravenous Contrast Given 12/02/24 0901)  ketorolac  (TORADOL ) 15 MG/ML injection 15 mg (15 mg Intravenous Given 12/02/24 0936)                                    Medical Decision Making Amount and/or Complexity of Data Reviewed Labs: ordered. Radiology: ordered.  Risk Prescription drug management.   Patient with headache.  Paresthesias on left side.  Differential diagnose includes causes such as complicated migraine but also intracranial hemorrhage/stroke.  Initial head CT and CTA done and reassuring.  Will treat for potential migraine also.  Will get MRI to  evaluate.  Not a TNK candidate due to very mild deficits of just paresthesias on the left side.  Head CT reassuring.  Blood work reassuring.  MRI done to rule out stroke.  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  Will need follow-up as an outpatient.  Has been on aspirin  but has not been taking consistently.  I think patient stable for discharge home.  Can follow-up with both neurology and his PCP.  May need close for control of his blood pressure.  Will discharge.      Final diagnoses:  Acute nonintractable headache, unspecified headache type  Cerebrovascular accident (CVA), unspecified mechanism [facility])    ED Discharge Orders          Ordered    Ambulatory referral to Neurology       Comments: An appointment is requested in approximately: 2 weeks   12/02/24 1106               Patsey Lot, MD 12/02/24 1112  "

## 2024-12-02 NOTE — Discharge Instructions (Addendum)
 It looks as if you would had some previous strokes.  Take the aspirin  more consistently.  Your blood pressure is also somewhat elevated.  Follow-up with your primary care doctor and follow-up with neurology.  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

## 2025-03-06 ENCOUNTER — Ambulatory Visit: Admitting: Neurology
